# Patient Record
Sex: Female | Born: 1935 | Race: White | Hispanic: No | Smoking: Former smoker
Health system: Southern US, Community
[De-identification: ages and names within clinical notes are randomized; demographics above are authoritative.]

## PROBLEM LIST (undated history)

## (undated) DIAGNOSIS — F419 Anxiety disorder, unspecified: Secondary | ICD-10-CM

## (undated) DIAGNOSIS — E119 Type 2 diabetes mellitus without complications: Secondary | ICD-10-CM

## (undated) DIAGNOSIS — G459 Transient cerebral ischemic attack, unspecified: Secondary | ICD-10-CM

## (undated) DIAGNOSIS — M1990 Unspecified osteoarthritis, unspecified site: Secondary | ICD-10-CM

## (undated) DIAGNOSIS — I2510 Atherosclerotic heart disease of native coronary artery without angina pectoris: Secondary | ICD-10-CM

## (undated) DIAGNOSIS — I214 Non-ST elevation (NSTEMI) myocardial infarction: Secondary | ICD-10-CM

## (undated) DIAGNOSIS — F32A Depression, unspecified: Secondary | ICD-10-CM

## (undated) DIAGNOSIS — F329 Major depressive disorder, single episode, unspecified: Secondary | ICD-10-CM

## (undated) DIAGNOSIS — F0390 Unspecified dementia without behavioral disturbance: Secondary | ICD-10-CM

## (undated) DIAGNOSIS — I10 Essential (primary) hypertension: Secondary | ICD-10-CM

## (undated) DIAGNOSIS — H9319 Tinnitus, unspecified ear: Secondary | ICD-10-CM

## (undated) HISTORY — PX: ABDOMINAL HYSTERECTOMY: SHX81

## (undated) HISTORY — DX: Tinnitus, unspecified ear: H93.19

## (undated) NOTE — *Deleted (*Deleted)
Family Medicine Teaching Service Daily Progress Note Intern Pager: [phone]  Patient name: Shawna Lynch Medical record number: [id number] Date of birth: 1935-04-24 Age: 24 y.o. Gender: female  Primary Care Provider: Michaele Offer, DO Consultants: Neuro Code Status: Full  Pt Overview and Major Events to Date:  ***  Assessment and Plan:  ***  FEN/GI: *** PPx: ***   Status is: Observation  {Observation:23811}  Dispo: The patient is from: {From:23814}              Anticipated d/c is to: {To:23815}              Anticipated d/c date is: {Days:23816}              Patient currently {Medically stable:23817}        Subjective:  ***  Objective: Temp:  [97.5 F (36.4 C)-98.6 F (37 C)] 97.9 F (36.6 C) (11/16 0826) Pulse Rate:  [80-100] 92 (11/16 0826) Resp:  [12-29] 20 (11/16 0826) BP: (95-162)/(60-104) 154/82 (11/16 0826) SpO2:  [89 %-99 %] 97 % (11/16 0826) Weight:  [63.8 kg] 63.8 kg (11/15 1200) Physical Exam: General: *** Cardiovascular: *** Respiratory: *** Abdomen: *** Extremities: ***  Laboratory: Recent Labs  Lab 08/13/20 1233 08/13/20 1238 08/14/20 0454  WBC 8.4  --  6.8  HGB 12.9 12.6 11.8*  HCT 38.9 37.0 35.5*  PLT 205  --  202   Recent Labs  Lab 08/13/20 1233 08/13/20 1238 08/14/20 0454  NA 137 137 137  K 3.9 3.9 3.8  CL 99 98 102  CO2 26  --  26  BUN 12 14 11   CREATININE 1.24* 1.20* 1.13*  CALCIUM 9.3  --  9.4  PROT 6.5  --   --   BILITOT 0.4  --   --   ALKPHOS 49  --   --   ALT 29  --   --   AST 30  --   --   GLUCOSE 296* 300* 196*    ***  Imaging/Diagnostic Tests: Jovita Kussmaul, MD 08/14/2020, 9:10 AM PGY-***, [facility] FPTS Intern pager: [phone]*, text pages welcome

---

## 1998-07-11 ENCOUNTER — Other Ambulatory Visit: Admission: RE | Admit: 1998-07-11 | Discharge: 1998-07-11 | Payer: Self-pay | Admitting: Family Medicine

## 1999-10-14 ENCOUNTER — Emergency Department (HOSPITAL_COMMUNITY): Admission: EM | Admit: 1999-10-14 | Discharge: 1999-10-14 | Payer: Self-pay | Admitting: Emergency Medicine

## 1999-10-14 ENCOUNTER — Encounter: Payer: Self-pay | Admitting: Emergency Medicine

## 1999-12-03 ENCOUNTER — Other Ambulatory Visit: Admission: RE | Admit: 1999-12-03 | Discharge: 1999-12-03 | Payer: Self-pay | Admitting: Family Medicine

## 2000-11-11 ENCOUNTER — Encounter: Payer: Self-pay | Admitting: Emergency Medicine

## 2000-11-12 ENCOUNTER — Inpatient Hospital Stay (HOSPITAL_COMMUNITY): Admission: EM | Admit: 2000-11-12 | Discharge: 2000-11-13 | Payer: Self-pay | Admitting: Emergency Medicine

## 2000-11-13 ENCOUNTER — Encounter: Payer: Self-pay | Admitting: Cardiology

## 2001-02-20 ENCOUNTER — Emergency Department (HOSPITAL_COMMUNITY): Admission: EM | Admit: 2001-02-20 | Discharge: 2001-02-20 | Payer: Self-pay | Admitting: *Deleted

## 2001-04-05 ENCOUNTER — Other Ambulatory Visit: Admission: RE | Admit: 2001-04-05 | Discharge: 2001-04-05 | Payer: Self-pay | Admitting: Family Medicine

## 2002-07-25 ENCOUNTER — Other Ambulatory Visit: Admission: RE | Admit: 2002-07-25 | Discharge: 2002-07-25 | Payer: Self-pay | Admitting: Family Medicine

## 2005-03-24 ENCOUNTER — Other Ambulatory Visit: Admission: RE | Admit: 2005-03-24 | Discharge: 2005-03-24 | Payer: Self-pay | Admitting: Family Medicine

## 2005-10-17 ENCOUNTER — Encounter: Admission: RE | Admit: 2005-10-17 | Discharge: 2005-10-17 | Payer: Self-pay | Admitting: Gastroenterology

## 2009-01-02 ENCOUNTER — Ambulatory Visit: Payer: Self-pay | Admitting: Ophthalmology

## 2009-01-02 ENCOUNTER — Ambulatory Visit: Payer: Self-pay | Admitting: Cardiology

## 2009-01-08 ENCOUNTER — Ambulatory Visit: Payer: Self-pay | Admitting: Ophthalmology

## 2009-03-13 ENCOUNTER — Ambulatory Visit: Payer: Self-pay | Admitting: Ophthalmology

## 2012-12-17 ENCOUNTER — Emergency Department: Payer: Self-pay | Admitting: Emergency Medicine

## 2012-12-17 LAB — URINALYSIS, COMPLETE
Bacteria: NONE SEEN
Bilirubin,UR: NEGATIVE
Blood: NEGATIVE
Glucose,UR: NEGATIVE mg/dL (ref 0–75)
Nitrite: NEGATIVE
Ph: 7 (ref 4.5–8.0)
Protein: NEGATIVE
RBC,UR: 2 /HPF (ref 0–5)
Specific Gravity: 1.017 (ref 1.003–1.030)
Squamous Epithelial: 1
WBC UR: 1 /HPF (ref 0–5)

## 2012-12-17 LAB — COMPREHENSIVE METABOLIC PANEL
Albumin: 3.7 g/dL (ref 3.4–5.0)
Alkaline Phosphatase: 54 U/L (ref 50–136)
Anion Gap: 8 (ref 7–16)
BUN: 15 mg/dL (ref 7–18)
Bilirubin,Total: 0.4 mg/dL (ref 0.2–1.0)
Calcium, Total: 9.2 mg/dL (ref 8.5–10.1)
Chloride: 102 mmol/L (ref 98–107)
Co2: 29 mmol/L (ref 21–32)
Creatinine: 1 mg/dL (ref 0.60–1.30)
EGFR (African American): >60
EGFR (Non-African Amer.): 55 — ABNORMAL LOW
Glucose: 151 mg/dL — ABNORMAL HIGH (ref 65–99)
Osmolality: 281 (ref 275–301)
Potassium: 4 mmol/L (ref 3.5–5.1)
SGOT(AST): 23 U/L (ref 15–37)
SGPT (ALT): 17 U/L (ref 12–78)
Sodium: 139 mmol/L (ref 136–145)
Total Protein: 8.1 g/dL (ref 6.4–8.2)

## 2012-12-17 LAB — CBC
HCT: 34.5 % — ABNORMAL LOW (ref 35.0–47.0)
HGB: 11.4 g/dL — ABNORMAL LOW (ref 12.0–16.0)
MCH: 30 pg (ref 26.0–34.0)
MCHC: 33 g/dL (ref 32.0–36.0)
MCV: 91 fL (ref 80–100)
Platelet: 244 10*3/uL (ref 150–440)
RBC: 3.8 10*6/uL (ref 3.80–5.20)
RDW: 14.1 % (ref 11.5–14.5)
WBC: 8.5 10*3/uL (ref 3.6–11.0)

## 2012-12-17 LAB — TROPONIN I: Troponin-I: <0.02 ng/mL

## 2013-01-02 ENCOUNTER — Other Ambulatory Visit: Payer: Self-pay | Admitting: Oncology

## 2014-12-05 DIAGNOSIS — E780 Pure hypercholesterolemia: Secondary | ICD-10-CM | POA: Diagnosis not present

## 2014-12-05 DIAGNOSIS — D649 Anemia, unspecified: Secondary | ICD-10-CM | POA: Diagnosis not present

## 2014-12-05 DIAGNOSIS — E538 Deficiency of other specified B group vitamins: Secondary | ICD-10-CM | POA: Diagnosis not present

## 2014-12-05 DIAGNOSIS — E119 Type 2 diabetes mellitus without complications: Secondary | ICD-10-CM | POA: Diagnosis not present

## 2014-12-08 DIAGNOSIS — I10 Essential (primary) hypertension: Secondary | ICD-10-CM | POA: Diagnosis not present

## 2014-12-08 DIAGNOSIS — E119 Type 2 diabetes mellitus without complications: Secondary | ICD-10-CM | POA: Diagnosis not present

## 2014-12-08 DIAGNOSIS — Z9181 History of falling: Secondary | ICD-10-CM | POA: Diagnosis not present

## 2014-12-08 DIAGNOSIS — Z1389 Encounter for screening for other disorder: Secondary | ICD-10-CM | POA: Diagnosis not present

## 2014-12-08 DIAGNOSIS — E780 Pure hypercholesterolemia: Secondary | ICD-10-CM | POA: Diagnosis not present

## 2014-12-08 DIAGNOSIS — N183 Chronic kidney disease, stage 3 (moderate): Secondary | ICD-10-CM | POA: Diagnosis not present

## 2015-01-04 DIAGNOSIS — D485 Neoplasm of uncertain behavior of skin: Secondary | ICD-10-CM | POA: Diagnosis not present

## 2015-04-11 DIAGNOSIS — E780 Pure hypercholesterolemia: Secondary | ICD-10-CM | POA: Diagnosis not present

## 2015-04-11 DIAGNOSIS — E119 Type 2 diabetes mellitus without complications: Secondary | ICD-10-CM | POA: Diagnosis not present

## 2015-04-13 DIAGNOSIS — I10 Essential (primary) hypertension: Secondary | ICD-10-CM | POA: Diagnosis not present

## 2015-04-13 DIAGNOSIS — E780 Pure hypercholesterolemia: Secondary | ICD-10-CM | POA: Diagnosis not present

## 2015-04-13 DIAGNOSIS — N183 Chronic kidney disease, stage 3 (moderate): Secondary | ICD-10-CM | POA: Diagnosis not present

## 2015-04-13 DIAGNOSIS — Z1389 Encounter for screening for other disorder: Secondary | ICD-10-CM | POA: Diagnosis not present

## 2015-04-13 DIAGNOSIS — Z139 Encounter for screening, unspecified: Secondary | ICD-10-CM | POA: Diagnosis not present

## 2015-04-13 DIAGNOSIS — E119 Type 2 diabetes mellitus without complications: Secondary | ICD-10-CM | POA: Diagnosis not present

## 2015-04-16 ENCOUNTER — Other Ambulatory Visit: Payer: Self-pay | Admitting: Family Medicine

## 2015-04-16 DIAGNOSIS — Z1231 Encounter for screening mammogram for malignant neoplasm of breast: Secondary | ICD-10-CM

## 2015-04-20 DIAGNOSIS — Z1231 Encounter for screening mammogram for malignant neoplasm of breast: Secondary | ICD-10-CM | POA: Diagnosis not present

## 2015-04-20 DIAGNOSIS — Z803 Family history of malignant neoplasm of breast: Secondary | ICD-10-CM | POA: Diagnosis not present

## 2015-04-24 DIAGNOSIS — Z6827 Body mass index (BMI) 27.0-27.9, adult: Secondary | ICD-10-CM | POA: Diagnosis not present

## 2015-04-24 DIAGNOSIS — D485 Neoplasm of uncertain behavior of skin: Secondary | ICD-10-CM | POA: Diagnosis not present

## 2015-04-25 ENCOUNTER — Ambulatory Visit: Payer: Self-pay

## 2015-08-09 DIAGNOSIS — E7800 Pure hypercholesterolemia, unspecified: Secondary | ICD-10-CM | POA: Diagnosis not present

## 2015-08-09 DIAGNOSIS — E119 Type 2 diabetes mellitus without complications: Secondary | ICD-10-CM | POA: Diagnosis not present

## 2015-08-13 DIAGNOSIS — I10 Essential (primary) hypertension: Secondary | ICD-10-CM | POA: Diagnosis not present

## 2015-08-13 DIAGNOSIS — Z1389 Encounter for screening for other disorder: Secondary | ICD-10-CM | POA: Diagnosis not present

## 2015-08-13 DIAGNOSIS — E1165 Type 2 diabetes mellitus with hyperglycemia: Secondary | ICD-10-CM | POA: Diagnosis not present

## 2015-08-13 DIAGNOSIS — E7800 Pure hypercholesterolemia, unspecified: Secondary | ICD-10-CM | POA: Diagnosis not present

## 2015-08-13 DIAGNOSIS — Z9181 History of falling: Secondary | ICD-10-CM | POA: Diagnosis not present

## 2015-08-13 DIAGNOSIS — N183 Chronic kidney disease, stage 3 (moderate): Secondary | ICD-10-CM | POA: Diagnosis not present

## 2015-08-13 DIAGNOSIS — Z23 Encounter for immunization: Secondary | ICD-10-CM | POA: Diagnosis not present

## 2015-09-17 DIAGNOSIS — M545 Low back pain: Secondary | ICD-10-CM | POA: Diagnosis not present

## 2015-09-17 DIAGNOSIS — M47816 Spondylosis without myelopathy or radiculopathy, lumbar region: Secondary | ICD-10-CM | POA: Diagnosis not present

## 2015-11-15 DIAGNOSIS — I999 Unspecified disorder of circulatory system: Secondary | ICD-10-CM | POA: Diagnosis not present

## 2015-11-15 DIAGNOSIS — R413 Other amnesia: Secondary | ICD-10-CM | POA: Diagnosis not present

## 2015-11-20 ENCOUNTER — Other Ambulatory Visit: Payer: Self-pay | Admitting: Family Medicine

## 2015-11-20 DIAGNOSIS — Z8679 Personal history of other diseases of the circulatory system: Secondary | ICD-10-CM

## 2015-11-20 DIAGNOSIS — R413 Other amnesia: Secondary | ICD-10-CM

## 2015-11-28 ENCOUNTER — Ambulatory Visit
Admission: RE | Admit: 2015-11-28 | Discharge: 2015-11-28 | Disposition: A | Discharge status: Home / Self Care | Payer: Medicare Other | Source: Ambulatory Visit | Attending: Family Medicine | Admitting: Family Medicine

## 2015-11-28 DIAGNOSIS — R413 Other amnesia: Secondary | ICD-10-CM

## 2015-11-28 DIAGNOSIS — Z8679 Personal history of other diseases of the circulatory system: Secondary | ICD-10-CM

## 2015-12-12 DIAGNOSIS — E7800 Pure hypercholesterolemia, unspecified: Secondary | ICD-10-CM | POA: Diagnosis not present

## 2015-12-12 DIAGNOSIS — E1165 Type 2 diabetes mellitus with hyperglycemia: Secondary | ICD-10-CM | POA: Diagnosis not present

## 2015-12-12 DIAGNOSIS — R413 Other amnesia: Secondary | ICD-10-CM | POA: Diagnosis not present

## 2015-12-18 DIAGNOSIS — E7800 Pure hypercholesterolemia, unspecified: Secondary | ICD-10-CM | POA: Diagnosis not present

## 2015-12-18 DIAGNOSIS — I10 Essential (primary) hypertension: Secondary | ICD-10-CM | POA: Diagnosis not present

## 2015-12-18 DIAGNOSIS — N183 Chronic kidney disease, stage 3 (moderate): Secondary | ICD-10-CM | POA: Diagnosis not present

## 2015-12-18 DIAGNOSIS — E1165 Type 2 diabetes mellitus with hyperglycemia: Secondary | ICD-10-CM | POA: Diagnosis not present

## 2015-12-18 DIAGNOSIS — E663 Overweight: Secondary | ICD-10-CM | POA: Diagnosis not present

## 2015-12-18 DIAGNOSIS — R350 Frequency of micturition: Secondary | ICD-10-CM | POA: Diagnosis not present

## 2016-01-06 DIAGNOSIS — S63602A Unspecified sprain of left thumb, initial encounter: Secondary | ICD-10-CM | POA: Diagnosis not present

## 2016-01-16 DIAGNOSIS — M1812 Unilateral primary osteoarthritis of first carpometacarpal joint, left hand: Secondary | ICD-10-CM | POA: Diagnosis not present

## 2016-04-18 DIAGNOSIS — E1165 Type 2 diabetes mellitus with hyperglycemia: Secondary | ICD-10-CM | POA: Diagnosis not present

## 2016-04-18 DIAGNOSIS — E7800 Pure hypercholesterolemia, unspecified: Secondary | ICD-10-CM | POA: Diagnosis not present

## 2016-04-18 DIAGNOSIS — I10 Essential (primary) hypertension: Secondary | ICD-10-CM | POA: Diagnosis not present

## 2016-04-18 DIAGNOSIS — N183 Chronic kidney disease, stage 3 (moderate): Secondary | ICD-10-CM | POA: Diagnosis not present

## 2016-06-17 DIAGNOSIS — L821 Other seborrheic keratosis: Secondary | ICD-10-CM | POA: Diagnosis not present

## 2016-06-17 DIAGNOSIS — L409 Psoriasis, unspecified: Secondary | ICD-10-CM | POA: Diagnosis not present

## 2016-12-18 DIAGNOSIS — Z6826 Body mass index (BMI) 26.0-26.9, adult: Secondary | ICD-10-CM | POA: Diagnosis not present

## 2016-12-18 DIAGNOSIS — E7800 Pure hypercholesterolemia, unspecified: Secondary | ICD-10-CM | POA: Diagnosis not present

## 2016-12-18 DIAGNOSIS — I10 Essential (primary) hypertension: Secondary | ICD-10-CM | POA: Diagnosis not present

## 2016-12-18 DIAGNOSIS — Z139 Encounter for screening, unspecified: Secondary | ICD-10-CM | POA: Diagnosis not present

## 2016-12-18 DIAGNOSIS — F419 Anxiety disorder, unspecified: Secondary | ICD-10-CM | POA: Diagnosis not present

## 2016-12-18 DIAGNOSIS — E1165 Type 2 diabetes mellitus with hyperglycemia: Secondary | ICD-10-CM | POA: Diagnosis not present

## 2016-12-18 DIAGNOSIS — R413 Other amnesia: Secondary | ICD-10-CM | POA: Diagnosis not present

## 2017-02-10 DIAGNOSIS — Z6825 Body mass index (BMI) 25.0-25.9, adult: Secondary | ICD-10-CM | POA: Diagnosis not present

## 2017-02-10 DIAGNOSIS — J189 Pneumonia, unspecified organism: Secondary | ICD-10-CM | POA: Diagnosis not present

## 2017-02-16 DIAGNOSIS — Z6825 Body mass index (BMI) 25.0-25.9, adult: Secondary | ICD-10-CM | POA: Diagnosis not present

## 2017-02-16 DIAGNOSIS — R05 Cough: Secondary | ICD-10-CM | POA: Diagnosis not present

## 2017-04-03 ENCOUNTER — Encounter (HOSPITAL_COMMUNITY): Payer: Self-pay | Admitting: Emergency Medicine

## 2017-04-03 ENCOUNTER — Emergency Department (HOSPITAL_COMMUNITY): Payer: Medicare HMO

## 2017-04-03 ENCOUNTER — Inpatient Hospital Stay (HOSPITAL_COMMUNITY)
Admission: EM | Admit: 2017-04-03 | Discharge: 2017-04-07 | DRG: 247 | Disposition: A | Discharge status: Home / Self Care | Payer: Medicare HMO | Attending: Cardiovascular Disease | Admitting: Cardiovascular Disease

## 2017-04-03 DIAGNOSIS — N182 Chronic kidney disease, stage 2 (mild): Secondary | ICD-10-CM | POA: Diagnosis not present

## 2017-04-03 DIAGNOSIS — E118 Type 2 diabetes mellitus with unspecified complications: Secondary | ICD-10-CM | POA: Diagnosis not present

## 2017-04-03 DIAGNOSIS — F0390 Unspecified dementia without behavioral disturbance: Secondary | ICD-10-CM | POA: Diagnosis not present

## 2017-04-03 DIAGNOSIS — E1122 Type 2 diabetes mellitus with diabetic chronic kidney disease: Secondary | ICD-10-CM | POA: Diagnosis present

## 2017-04-03 DIAGNOSIS — R0789 Other chest pain: Secondary | ICD-10-CM | POA: Diagnosis not present

## 2017-04-03 DIAGNOSIS — Z79899 Other long term (current) drug therapy: Secondary | ICD-10-CM

## 2017-04-03 DIAGNOSIS — E785 Hyperlipidemia, unspecified: Secondary | ICD-10-CM | POA: Diagnosis not present

## 2017-04-03 DIAGNOSIS — I129 Hypertensive chronic kidney disease with stage 1 through stage 4 chronic kidney disease, or unspecified chronic kidney disease: Secondary | ICD-10-CM | POA: Diagnosis present

## 2017-04-03 DIAGNOSIS — Z955 Presence of coronary angioplasty implant and graft: Secondary | ICD-10-CM

## 2017-04-03 DIAGNOSIS — Z87891 Personal history of nicotine dependence: Secondary | ICD-10-CM | POA: Diagnosis not present

## 2017-04-03 DIAGNOSIS — Z885 Allergy status to narcotic agent status: Secondary | ICD-10-CM

## 2017-04-03 DIAGNOSIS — I214 Non-ST elevation (NSTEMI) myocardial infarction: Principal | ICD-10-CM | POA: Diagnosis present

## 2017-04-03 DIAGNOSIS — D631 Anemia in chronic kidney disease: Secondary | ICD-10-CM | POA: Diagnosis present

## 2017-04-03 DIAGNOSIS — Z7984 Long term (current) use of oral hypoglycemic drugs: Secondary | ICD-10-CM

## 2017-04-03 DIAGNOSIS — I10 Essential (primary) hypertension: Secondary | ICD-10-CM | POA: Diagnosis present

## 2017-04-03 DIAGNOSIS — I252 Old myocardial infarction: Secondary | ICD-10-CM | POA: Diagnosis present

## 2017-04-03 DIAGNOSIS — R079 Chest pain, unspecified: Secondary | ICD-10-CM | POA: Diagnosis not present

## 2017-04-03 DIAGNOSIS — I2510 Atherosclerotic heart disease of native coronary artery without angina pectoris: Secondary | ICD-10-CM | POA: Diagnosis not present

## 2017-04-03 DIAGNOSIS — E784 Other hyperlipidemia: Secondary | ICD-10-CM | POA: Diagnosis not present

## 2017-04-03 DIAGNOSIS — E119 Type 2 diabetes mellitus without complications: Secondary | ICD-10-CM | POA: Diagnosis present

## 2017-04-03 HISTORY — DX: Non-ST elevation (NSTEMI) myocardial infarction: I21.4

## 2017-04-03 HISTORY — DX: Type 2 diabetes mellitus without complications: E11.9

## 2017-04-03 HISTORY — DX: Anxiety disorder, unspecified: F41.9

## 2017-04-03 HISTORY — DX: Unspecified dementia, unspecified severity, without behavioral disturbance, psychotic disturbance, mood disturbance, and anxiety: F03.90

## 2017-04-03 HISTORY — DX: Unspecified osteoarthritis, unspecified site: M19.90

## 2017-04-03 HISTORY — DX: Transient cerebral ischemic attack, unspecified: G45.9

## 2017-04-03 HISTORY — DX: Atherosclerotic heart disease of native coronary artery without angina pectoris: I25.10

## 2017-04-03 HISTORY — DX: Essential (primary) hypertension: I10

## 2017-04-03 HISTORY — DX: Major depressive disorder, single episode, unspecified: F32.9

## 2017-04-03 HISTORY — DX: Depression, unspecified: F32.A

## 2017-04-03 LAB — CBC
HEMATOCRIT: 33.6 % — AB (ref 36.0–46.0)
Hemoglobin: 11.3 g/dL — ABNORMAL LOW (ref 12.0–15.0)
MCH: 29.9 pg (ref 26.0–34.0)
MCHC: 33.6 g/dL (ref 30.0–36.0)
MCV: 88.9 fL (ref 78.0–100.0)
PLATELETS: 175 10*3/uL (ref 150–400)
RBC: 3.78 MIL/uL — ABNORMAL LOW (ref 3.87–5.11)
RDW: 13.6 % (ref 11.5–15.5)
WBC: 9.6 10*3/uL (ref 4.0–10.5)

## 2017-04-03 LAB — BASIC METABOLIC PANEL
Anion gap: 11 (ref 5–15)
BUN: 18 mg/dL (ref 6–20)
CHLORIDE: 106 mmol/L (ref 101–111)
CO2: 18 mmol/L — ABNORMAL LOW (ref 22–32)
CREATININE: 1.29 mg/dL — AB (ref 0.44–1.00)
Calcium: 9 mg/dL (ref 8.9–10.3)
GFR calc Af Amer: 44 mL/min — ABNORMAL LOW (ref >60)
GFR calc non Af Amer: 38 mL/min — ABNORMAL LOW (ref >60)
GLUCOSE: 132 mg/dL — AB (ref 65–99)
POTASSIUM: 4.4 mmol/L (ref 3.5–5.1)
SODIUM: 135 mmol/L (ref 135–145)

## 2017-04-03 LAB — PROTIME-INR
INR: 0.93
Prothrombin Time: 12.5 seconds (ref 11.4–15.2)

## 2017-04-03 LAB — I-STAT TROPONIN, ED: Troponin i, poc: 3.87 ng/mL (ref 0.00–0.08)

## 2017-04-03 MED ORDER — ACETAMINOPHEN 325 MG PO TABS
650.0000 mg | ORAL_TABLET | ORAL | Status: DC | PRN
  Administered 2017-04-05: 650 mg via ORAL
  Filled 2017-04-03: qty 2

## 2017-04-03 MED ORDER — HEPARIN (PORCINE) IN NACL 100-0.45 UNIT/ML-% IJ SOLN
800.0000 [IU]/h | INTRAMUSCULAR | Status: DC
  Administered 2017-04-03: 800 [IU]/h via INTRAVENOUS
  Filled 2017-04-03: qty 250

## 2017-04-03 MED ORDER — PANTOPRAZOLE SODIUM 40 MG PO TBEC
40.0000 mg | DELAYED_RELEASE_TABLET | Freq: Every day | ORAL | Status: DC
  Administered 2017-04-04 – 2017-04-07 (×4): 40 mg via ORAL
  Filled 2017-04-03 (×4): qty 1

## 2017-04-03 MED ORDER — HEPARIN SODIUM (PORCINE) 5000 UNIT/ML IJ SOLN: 4000.0000 [IU] | Freq: Once | INTRAMUSCULAR | Status: DC

## 2017-04-03 MED ORDER — HEPARIN SODIUM (PORCINE) 5000 UNIT/ML IJ SOLN: 60.0000 [IU]/kg | Freq: Once | INTRAMUSCULAR | Status: DC

## 2017-04-03 MED ORDER — NITROGLYCERIN 0.4 MG SL SUBL: 0.4000 mg | SUBLINGUAL_TABLET | SUBLINGUAL | Status: DC | PRN

## 2017-04-03 MED ORDER — DONEPEZIL HCL 10 MG PO TABS
10.0000 mg | ORAL_TABLET | Freq: Every day | ORAL | Status: DC
  Administered 2017-04-03 – 2017-04-06 (×4): 10 mg via ORAL
  Filled 2017-04-03 (×4): qty 1

## 2017-04-03 MED ORDER — ASPIRIN 81 MG PO CHEW
324.0000 mg | CHEWABLE_TABLET | Freq: Once | ORAL | Status: AC
  Administered 2017-04-04: 324 mg via ORAL
  Filled 2017-04-03: qty 4

## 2017-04-03 MED ORDER — ATORVASTATIN CALCIUM 80 MG PO TABS
80.0000 mg | ORAL_TABLET | Freq: Every day | ORAL | Status: DC
  Administered 2017-04-04 – 2017-04-06 (×3): 80 mg via ORAL
  Filled 2017-04-03 (×3): qty 1

## 2017-04-03 MED ORDER — HEPARIN BOLUS VIA INFUSION
4000.0000 [IU] | Freq: Once | INTRAVENOUS | Status: AC
  Administered 2017-04-03: 4000 [IU] via INTRAVENOUS
  Filled 2017-04-03: qty 4000

## 2017-04-03 MED ORDER — SERTRALINE HCL 50 MG PO TABS
100.0000 mg | ORAL_TABLET | Freq: Every day | ORAL | Status: DC
Start: 2017-04-04 — End: 2017-04-07
  Administered 2017-04-04 – 2017-04-07 (×4): 100 mg via ORAL
  Filled 2017-04-03: qty 2
  Filled 2017-04-03: qty 1
  Filled 2017-04-03: qty 2
  Filled 2017-04-03: qty 1

## 2017-04-03 MED ORDER — ONDANSETRON HCL 4 MG/2ML IJ SOLN
4.0000 mg | Freq: Four times a day (QID) | INTRAMUSCULAR | Status: DC | PRN
  Administered 2017-04-07: 4 mg via INTRAVENOUS
  Filled 2017-04-03: qty 2

## 2017-04-03 MED ORDER — MEMANTINE HCL ER 28 MG PO CP24
28.0000 mg | ORAL_CAPSULE | Freq: Every day | ORAL | Status: DC
  Administered 2017-04-03 – 2017-04-06 (×4): 28 mg via ORAL
  Filled 2017-04-03 (×4): qty 1

## 2017-04-03 MED ORDER — METOPROLOL TARTRATE 25 MG PO TABS
25.0000 mg | ORAL_TABLET | Freq: Two times a day (BID) | ORAL | Status: DC
  Administered 2017-04-03 – 2017-04-07 (×8): 25 mg via ORAL
  Filled 2017-04-03 (×8): qty 1

## 2017-04-03 MED ORDER — LISINOPRIL 10 MG PO TABS
20.0000 mg | ORAL_TABLET | Freq: Every day | ORAL | Status: DC
  Administered 2017-04-04 – 2017-04-05 (×2): 20 mg via ORAL
  Filled 2017-04-03 (×2): qty 1

## 2017-04-03 MED ORDER — INSULIN ASPART 100 UNIT/ML ~~LOC~~ SOLN
0.0000 [IU] | Freq: Three times a day (TID) | SUBCUTANEOUS | Status: DC
  Administered 2017-04-04: 1 [IU] via SUBCUTANEOUS
  Administered 2017-04-04 – 2017-04-05 (×3): 2 [IU] via SUBCUTANEOUS
  Administered 2017-04-05: 1 [IU] via SUBCUTANEOUS
  Administered 2017-04-06: 2 [IU] via SUBCUTANEOUS
  Administered 2017-04-06: 11:00:00 1 [IU] via SUBCUTANEOUS
  Administered 2017-04-07: 07:00:00 2 [IU] via SUBCUTANEOUS

## 2017-04-03 MED ORDER — ASPIRIN EC 81 MG PO TBEC
81.0000 mg | DELAYED_RELEASE_TABLET | Freq: Every day | ORAL | Status: DC
  Administered 2017-04-04 – 2017-04-07 (×3): 81 mg via ORAL
  Filled 2017-04-03 (×3): qty 1

## 2017-04-03 NOTE — H&P (Signed)
CARDIOLOGY HISTORY AND PHYSICAL     Date: 04/03/2017 Admitting Physician: Baruch Merl, MD, PhD  Chief Complaint:  Chest Pain ____________________________________________________________________ History of Present Illness:  Shawna Lynch is a 82 y.o. old female with medical history noted below who presents with acute complaints of chest pain.  The patient has some baseline dementia by her own admission and has difficulty describing her presenting complaint.  The history is partially provided by the patient's son.  She was at a friends house earlier in the evening when she developed onset of chest pain.  Per the ED notes, this pain started yesterday, however, she cannot recall when it started.  The pain is localized to the left breast and felt as though someone was squeezing her chest.  She doesn't remember if she had dyspnea, but denied lightheadedness, nausea, or diaphoresis.  She has never had chest discomfort like this before.  She was given ASA on the ambulance ride and on my exam she is currently chest pain free.  She denies history of CAD, MI, or CHF.  No acute CHF symptoms.  Not currently short of breath.  No recent rapid weight gain.  She states she has occasional "fluttering" in her chest.  None associated with this episode of chest pain.    Review of Systems:   Review of Systems:  GEN: no fever, chills, nausea, vomiting, weight change  HEENT: no vision or hearing changes  PULM: no coughing, SOB  CV: +chest pain, palpitations, PND, orthopnea  GI: no abdominal pain  GU: no dysuria  EXT: no swelling  SKIN: no rashes  NEURO: no numbness or tingling  HEME: no bleeding or bruising  GYN: none  --12 point review systems- otherwise negative.  All other systems reviewed and are negative  Past Medical History:  Diagnosis Date  . Dementia   . Diabetes mellitus without complication ([place])   . Hypertension     Past Surgical History:  Procedure Laterality Date  . ABDOMINAL  HYSTERECTOMY      Social History   Social History  . Marital status: Married    Spouse name: N/A  . Number of children: N/A  . Years of education: N/A   Occupational History  . Not on file.   Social History Main Topics  . Smoking status: Former Research scientist (life sciences)  . Smokeless tobacco: Not on file     Comment: quit "20 years ago"  . Alcohol use No  . Drug use: No  . Sexual activity: Not on file   Other Topics Concern  . Not on file   Social History Narrative  . No narrative on file    Family History  Problem Relation Age of Onset  . CAD Father   . Cancer Sister     Past Cardiovascular History:  - No documented h/o CAD - No documented h/o MI - No documented h/o CHF - No documented h/o PVD - No documented h/o AAA - No documented h/o valvular heart disease - No documented h/o CVA - No documented h/o Arrhythmias - No documented h/o A-fib  - No documented h/o congenital heart disease - No documented h/o CABG - No documented h/o PCI - No documented h/o cardiac devices (Pacer/ICD/CRT) - No documented h/o cardiac surgery       Most recent stress test:  None  Most recent echocardiography:  None  Most recent left heart catheterization:  None  CABG:  Date/ Physician: None  Device history:  None  Prior to Admission medications  Medication Sig Start Date End Date Taking? Authorizing Provider  carvedilol (COREG) 25 MG tablet Take 12.5 mg by mouth 2 (two) times daily with a meal.   Yes [provider]  donepezil (ARICEPT) 10 MG tablet Take 10 mg by mouth at bedtime.   Yes [provider]  lisinopril (PRINIVIL,ZESTRIL) 40 MG tablet Take 40 mg by mouth daily.   Yes [provider]  memantine (NAMENDA XR) 28 MG CP24 24 hr capsule Take 28 mg by mouth at bedtime.   Yes [provider]  metFORMIN (GLUCOPHAGE) 500 MG tablet Take 500 mg by mouth 2 (two) times daily with a meal.   Yes [provider]  omega-3 acid ethyl esters (LOVAZA) 1 g  capsule Take 1 g by mouth 2 (two) times daily.   Yes [provider]  omeprazole (PRILOSEC) 20 MG capsule Take 20 mg by mouth daily.   Yes [provider]  pioglitazone (ACTOS) 45 MG tablet Take 45 mg by mouth daily.   Yes [provider]  sertraline (ZOLOFT) 100 MG tablet Take 100 mg by mouth daily.   Yes [provider]  simvastatin (ZOCOR) 40 MG tablet Take 40 mg by mouth daily.   Yes [provider]    Allergies  Allergen Reactions  . Codeine Rash    Social History:   Social History  Substance Use Topics  . Smoking status: Former Research scientist (life sciences)  . Smokeless tobacco: Not on file     Comment: quit "20 years ago"  . Alcohol use No    Family History  Problem Relation Age of Onset  . CAD Father   . Cancer Sister     Physical Examination: Blood pressure (!) 113/57, pulse 81, temperature 98.2 F (36.8 C), resp. rate 20, height 5\' 6"  (1.676 m), weight 65.8 kg (145 lb), SpO2 96 %. General:  Alert to person and place.  NAD.  NRD.  HENT: Normocephalic. Atraumatic.  No acute abnom. EYES: PERRL EOMI  Neck: Supple.  No JVD.  No bruits. Cardiovascular:  Nl S1. Nl S2. No S3. No S4. Nl PMI. 2/6 systolic murmur, No r/c. RRR  Pulmonary/Chest: CTA B. No rales. No wheezing.  Abdomen: Soft, NT, no masses, no organomegaly. Neuro: CN intact, no motor/sensory deficit.  Ext: Warm. No edema.  SKIN- intact  No intake or output data in the 24 hours ending 04/03/17 2211  Troponin (Point of Care Test)  Recent Labs  04/03/17 1837  TROPIPOC 3.87*   ____________________________________________________________________ Assessment/Plan  NSTEMI   Assessment:  Patient presents with complaints of chest pain and found to have elevated cardiac enzymes.  She is currently chest pain free.  Will continue heparin, ASA, beta blockade and make NPO after midnight for possible heart cath.  I think it's certainly with reason to treat her medically given her advanced age  and the fact she is currently chest pain free.  Having said that, will defer to the day team regarding going to the cath lab.   Plan  -  Heparin  -  Trend enzymes  -  ECHO  -  Metoprolol  -  Statin  -  Home lisinopril at 20 mg  Diabetes   Assessment:  On metformin at home.  Will hold that here and cover her with supplemental insulin.     Plan  -  Hold metformin  -  Supplemental insulin for glucose coverage  Dementia -  Continue home medical management  Thank you for consulting cardiology.  Electronically signed by Lowella Dandy 04/03/2017 Baruch Merl, MD, PhD Cardiology

## 2017-04-03 NOTE — ED Notes (Signed)
Assisted to bedside commode, stumbled on transfer

## 2017-04-03 NOTE — Progress Notes (Signed)
ANTICOAGULATION CONSULT NOTE - Initial Consult  Pharmacy Consult for heparin Indication: chest pain/ACS  Not on File  Patient Measurements: Height: 5\' 6"  (167.6 cm) Weight: 145 lb (65.8 kg) IBW/kg (Calculated) : 59.3 Heparin Dosing Weight: 65.8kg  Vital Signs: Temp: 98.2 F (36.8 C) (07/06 1755) BP: 142/70 (07/06 1845) Pulse Rate: 76 (07/06 1845)  Labs:  Recent Labs  04/03/17 1830  HGB 11.3*  HCT 33.6*  PLT 175  LABPROT 12.5  INR 0.93  CREATININE 1.29*    Estimated Creatinine Clearance: 32.6 mL/min (A) (by C-G formula based on SCr of 1.29 mg/dL (H)).   Medical History: Past Medical History:  Diagnosis Date  . Dementia   . Diabetes mellitus without complication ([place])   . Hypertension     Medications:  Infusions:  . heparin      Assessment: 65 yof presented to the ED with CP and SOB. Troponin found to be significantly elevated. To start IV heparin. Baseline H/H is slightly low and platelets are WNL. She is not on anticoagulation PTA.   Goal of Therapy:  Heparin level 0.3-0.7 units/ml Monitor platelets by anticoagulation protocol: Yes   Plan:  Heparin bolus 4000 units IV x 1 Heparin gtt 800 units/hr Check an 8 hr heparin level Daily heparin level and CBC  Aleya Durnell, Rande Lawman 04/03/2017,8:41 PM

## 2017-04-03 NOTE — ED Provider Notes (Signed)
[place] DEPT Provider Note   CSN: [id number] Arrival date & time: 04/03/17  1730     History   Chief Complaint Chief Complaint  Patient presents with  . Chest Pain    HPI Shawna Lynch is a 82 y.o. female.  The history is provided by the patient.  Illness  This is a new problem. The current episode started 12 to 24 hours ago. The problem occurs rarely. The problem has been gradually improving. Associated symptoms include chest pain. Pertinent negatives include no abdominal pain, no headaches and no shortness of breath. Nothing aggravates the symptoms. Nothing relieves the symptoms. She has tried nothing for the symptoms.    Past Medical History:  Diagnosis Date  . Dementia   . Diabetes mellitus without complication ([place])   . Hypertension     Patient Active Problem List   Diagnosis Date Noted  . NSTEMI (non-ST elevated myocardial infarction) ([place]) 04/03/2017    Past Surgical History:  Procedure Laterality Date  . ABDOMINAL HYSTERECTOMY      OB History    No data available       Home Medications    Prior to Admission medications   Not on File    Family History Family History  Problem Relation Age of Onset  . CAD Father   . Cancer Sister     Social History Social History  Substance Use Topics  . Smoking status: Former Research scientist (life sciences)  . Smokeless tobacco: Not on file     Comment: quit "20 years ago"  . Alcohol use No     Allergies   Codeine   Review of Systems Review of Systems  Constitutional: Negative for chills and fever.  HENT: Negative for ear pain and sore throat.   Eyes: Negative for pain and visual disturbance.  Respiratory: Negative for cough and shortness of breath.   Cardiovascular: Positive for chest pain. Negative for palpitations.  Gastrointestinal: Negative for abdominal pain and vomiting.  Genitourinary: Negative for dysuria and hematuria.  Musculoskeletal: Negative for arthralgias and back pain.  Skin: Negative for color  change and rash.  Allergic/Immunologic: Negative for immunocompromised state.  Neurological: Negative for seizures, syncope and headaches.  Psychiatric/Behavioral: Negative for agitation and behavioral problems.  All other systems reviewed and are negative.    Physical Exam Updated Vital Signs BP (!) 157/78   Pulse 80   Temp 98.2 F (36.8 C)   Resp 16   Ht 5\' 6"  (1.676 m)   Wt 65.8 kg (145 lb)   SpO2 99%   BMI 23.40 kg/m   Physical Exam  Constitutional: She is oriented to person, place, and time. She appears well-developed and well-nourished. No distress.  HENT:  Head: Normocephalic and atraumatic.  Eyes: Conjunctivae and EOM are normal. Pupils are equal, round, and reactive to light.  Neck: Normal range of motion. Neck supple. No tracheal deviation present.  Cardiovascular: Normal rate and regular rhythm.   No murmur heard. Pulmonary/Chest: Effort normal and breath sounds normal. No respiratory distress.  Abdominal: Soft. There is no tenderness.  Musculoskeletal: She exhibits no edema or deformity.  Neurological: She is alert and oriented to person, place, and time.  Skin: Skin is warm and dry. She is not diaphoretic.  Psychiatric: She has a normal mood and affect.  Nursing note and vitals reviewed.    ED Treatments / Results  Labs (all labs ordered are listed, but only abnormal results are displayed) Labs Reviewed  BASIC METABOLIC PANEL - Abnormal; Notable for the  following:       Result Value   CO2 18 (*)    Glucose, Bld 132 (*)    Creatinine, Ser 1.29 (*)    GFR calc non Af Amer 38 (*)    GFR calc Af Amer 44 (*)    All other components within normal limits  CBC - Abnormal; Notable for the following:    RBC 3.78 (*)    Hemoglobin 11.3 (*)    HCT 33.6 (*)    All other components within normal limits  I-STAT TROPOININ, ED - Abnormal; Notable for the following:    Troponin i, poc 3.87 (*)    All other components within normal limits  PROTIME-INR  HEPARIN  LEVEL (UNFRACTIONATED)  CBC    EKG  EKG Interpretation  Date/Time:  Friday April 03 2017 17:54:03 EDT Ventricular Rate:  81 PR Interval:    QRS Duration: 85 QT Interval:  365 QTC Calculation: 424 R Axis:   17 Text Interpretation:  Sinus rhythm No significant change since last tracing Confirmed by Fredia Sorrow [phone]) on 04/03/2017 7:07:09 PM       Radiology Dg Chest 2 View  Result Date: 04/03/2017 CLINICAL DATA:  Acute left chest pain today. EXAM: CHEST  2 VIEW COMPARISON:  None. FINDINGS: The cardiomediastinal silhouette is unremarkable. There is no evidence of focal airspace disease, pulmonary edema, suspicious pulmonary nodule/mass, pleural effusion, or pneumothorax. No acute bony abnormalities are identified. IMPRESSION: No active cardiopulmonary disease. Electronically Signed   By: Margarette Canada M.D.   On: 04/03/2017 18:25    Procedures Procedures (including critical care time)  Medications Ordered in ED Medications  aspirin chewable tablet 324 mg (324 mg Oral Not Given 04/03/17 2003)  heparin ADULT infusion 100 units/mL (25000 units/228mL sodium chloride 0.45%) (800 Units/hr Intravenous New Bag/Given 04/03/17 2044)  heparin bolus via infusion 4,000 Units (4,000 Units Intravenous Bolus from Bag 04/03/17 2049)     Initial Impression / Assessment and Plan / ED Course  I have reviewed the triage vital signs and the nursing notes.  Pertinent labs & imaging results that were available during my care of the patient were reviewed by me and considered in my medical decision making (see chart for details).     Shawna Lynch is a 82 year old female with hx of mild dementia, htn, DM who is coming in [place] with chest pain. Patient states she woke up this morning and began having left-sided chest pain she described as sharp as well as heavy. No nausea, vomiting, diaphoresis at that time. Patient was watching TV when it initially started and denies any inciting event. States it resolved on  its own but she has since maintained that she feels sore on the left side of her chest.  On exam patient sitting up in bed in no apparent distress. Mild hypertension but vital signs otherwise stable and within normal limits. Cardiac exam unremarkable as well as respiratory exam. Given 324 aspirin. EKG shows sinus rhythm with a heart rate of 81 beats minute, PR interval 170 ms, QRS duration 85 ms, QTC 424 ms. No signs of ST segment elevation or other signs of acute ischemia. Troponin elevated at 3.87. Basic metabolic panel shows slightly elevated creatinine at 1.29. CBC shows no leukocytosis. Chest x-ray shows no cardiopulmonary abnormalities. With concern for N STEMI, cardiology was consulted and they evaluated the patient in the emergency department. She will be admitted to their service for further evaluation and management. Heparin started here in the emergency department.  Patient was seen with my attending, Dr. Rogene Houston, who voiced agreement and oversaw the evaluation and treatment of this patient.   [employer] seismologist was used in the creation of this note. If there are any errors or inconsistencies needing clarification, please contact me directly.   Final Clinical Impressions(s) / ED Diagnoses   Final diagnoses:  NSTEMI (non-ST elevated myocardial infarction) [facility])    New Prescriptions New Prescriptions   No medications on file     Valda Lamb, MD 04/03/17 4742    Fredia Sorrow, MD 04/10/17 [phone]

## 2017-04-03 NOTE — Care Management (Signed)
ED CM  Met with patient at bedside patient confirmed information. Patient lives alone independently, daughter lives nearby and cheeks in on her. Ambulates without assistance. PCP Cyndi Bender PA-C in [place]. Patient utilizes [facility]) [phone] in [place]. Patient plans to return home after this hospitalization. Fenton Malling ([phone] is her contact person. CM explained unit CM will follow for transitional care planning needs.

## 2017-04-03 NOTE — ED Triage Notes (Signed)
Pt BIB [facility] from friends house for intermittent CP since 56 yesterday. Pt endorses SOB with exertion and describes CP as 1/10 central tightness with radiation to neck. Received 324 mg ASA PTA. NSR with EMS. Pt hx of dementia, but A&O x 4. Resp e/u; NAD noted at this time.

## 2017-04-04 ENCOUNTER — Inpatient Hospital Stay (HOSPITAL_COMMUNITY): Payer: Medicare HMO

## 2017-04-04 LAB — ECHOCARDIOGRAM COMPLETE
CHL CUP DOP CALC LVOT VTI: 20.5 cm
CHL CUP MV DEC (S): 197
E decel time: 197 msec
E/e' ratio: 11.28
FS: 31 % (ref 28–44)
HEIGHTINCHES: 66 in
IV/PV OW: 0.99
LA vol: 38.4 mL
LADIAMINDEX: 2.1 cm/m2
LASIZE: 37 mm
LAVOLA4C: 43.8 mL
LAVOLIN: 21.8 mL/m2
LEFT ATRIUM END SYS DIAM: 37 mm
LVEEAVG: 11.28
LVEEMED: 11.28
LVELAT: 5.77 cm/s
LVOT peak vel: 93.3 cm/s
MV pk E vel: 65.1 m/s
MVPKAVEL: 93 m/s
PW: 10 mm — AB (ref 0.6–1.1)
RV sys press: 34 mmHg
Reg peak vel: 278 cm/s
TAPSE: 16.7 mm
TDI e' lateral: 5.77
TDI e' medial: 5.84
TR max vel: 278 cm/s
Weight: 2366.4 oz

## 2017-04-04 LAB — BASIC METABOLIC PANEL
Anion gap: 8 (ref 5–15)
BUN: 15 mg/dL (ref 6–20)
CHLORIDE: 107 mmol/L (ref 101–111)
CO2: 23 mmol/L (ref 22–32)
CREATININE: 1.19 mg/dL — AB (ref 0.44–1.00)
Calcium: 9 mg/dL (ref 8.9–10.3)
GFR calc Af Amer: 49 mL/min — ABNORMAL LOW (ref >60)
GFR calc non Af Amer: 42 mL/min — ABNORMAL LOW (ref >60)
GLUCOSE: 155 mg/dL — AB (ref 65–99)
POTASSIUM: 4.4 mmol/L (ref 3.5–5.1)
Sodium: 138 mmol/L (ref 135–145)

## 2017-04-04 LAB — GLUCOSE, CAPILLARY
GLUCOSE-CAPILLARY: 153 mg/dL — AB (ref 65–99)
GLUCOSE-CAPILLARY: 147 mg/dL — AB (ref 65–99)
Glucose-Capillary: 126 mg/dL — ABNORMAL HIGH (ref 65–99)
Glucose-Capillary: 170 mg/dL — ABNORMAL HIGH (ref 65–99)

## 2017-04-04 LAB — HEPARIN LEVEL (UNFRACTIONATED)
HEPARIN UNFRACTIONATED: 0.83 [IU]/mL — AB (ref 0.30–0.70)
Heparin Unfractionated: 0.52 IU/mL (ref 0.30–0.70)

## 2017-04-04 LAB — CBC
HEMATOCRIT: 34.1 % — AB (ref 36.0–46.0)
HEMOGLOBIN: 11.1 g/dL — AB (ref 12.0–15.0)
MCH: 29 pg (ref 26.0–34.0)
MCHC: 32.6 g/dL (ref 30.0–36.0)
MCV: 89 fL (ref 78.0–100.0)
Platelets: 167 10*3/uL (ref 150–400)
RBC: 3.83 MIL/uL — AB (ref 3.87–5.11)
RDW: 13.9 % (ref 11.5–15.5)
WBC: 7.4 10*3/uL (ref 4.0–10.5)

## 2017-04-04 LAB — TROPONIN I
TROPONIN I: 6.3 ng/mL — AB (ref <0.03)
Troponin I: 4.92 ng/mL (ref <0.03)
Troponin I: 4.97 ng/mL (ref <0.03)

## 2017-04-04 LAB — MRSA PCR SCREENING: MRSA BY PCR: NEGATIVE

## 2017-04-04 MED ORDER — HEPARIN (PORCINE) IN NACL 100-0.45 UNIT/ML-% IJ SOLN
700.0000 [IU]/h | INTRAMUSCULAR | Status: DC
  Administered 2017-04-04 – 2017-04-06 (×2): 700 [IU]/h via INTRAVENOUS
  Filled 2017-04-04 (×2): qty 250

## 2017-04-04 MED ORDER — SODIUM BICARBONATE 8.4 % IV SOLN
INTRAVENOUS | Status: AC
  Filled 2017-04-04: qty 50

## 2017-04-04 NOTE — Progress Notes (Signed)
ANTICOAGULATION CONSULT NOTE - Follow Up Consult  Pharmacy Consult for Heparin Indication: chest pain/ACS  Allergies  Allergen Reactions  . Codeine Rash    Patient Measurements: Height: 5\' 6"  (167.6 cm) Weight: 147 lb 14.4 oz (67.1 kg) IBW/kg (Calculated) : 59.3  Vital Signs: Temp: 98.3 F (36.8 C) (07/07 0934) Temp Source: Oral (07/07 0934) BP: 128/70 (07/07 0934) Pulse Rate: 83 (07/07 0934)  Labs:  Recent Labs  04/03/17 1830 04/03/17 2310 04/04/17 0617  HGB 11.3*  --  11.1*  HCT 33.6*  --  34.1*  PLT 175  --  167  LABPROT 12.5  --   --   INR 0.93  --   --   HEPARINUNFRC  --   --  0.83*  CREATININE 1.29*  --  1.19*  TROPONINI  --  6.30* 4.92*    Estimated Creatinine Clearance: 35.3 mL/min (A) (by C-G formula based on SCr of 1.19 mg/dL (H)).   Medications:  Heparin @ 800 units/hr  Assessment: 82yof started on heparin yesterday for NSTEMI. Initial heparin level is supratherapeutic at 0.83. CBC stable. No bleeding. Noted plan for cath Monday.  Goal of Therapy:  Heparin level 0.3-0.7 units/ml Monitor platelets by anticoagulation protocol: Yes   Plan:  1) Decrease heparin to 700 units/hr 2) Check 8 hour heparin level  [name] 04/04/2017,10:23 AM

## 2017-04-04 NOTE — Progress Notes (Signed)
[place] for Heparin Indication: chest pain/ACS  Allergies  Allergen Reactions  . Codeine Rash    Patient Measurements: Height: 5\' 6"  (167.6 cm) Weight: 147 lb 14.4 oz (67.1 kg) IBW/kg (Calculated) : 59.3  Vital Signs: Temp: 98.3 F (36.8 C) (07/07 1606) Temp Source: Oral (07/07 1606) BP: 105/58 (07/07 1606) Pulse Rate: 75 (07/07 1606)  Labs:  Recent Labs  04/03/17 1830 04/03/17 2310 04/04/17 0617 04/04/17 0956 04/04/17 1828  HGB 11.3*  --  11.1*  --   --   HCT 33.6*  --  34.1*  --   --   PLT 175  --  167  --   --   LABPROT 12.5  --   --   --   --   INR 0.93  --   --   --   --   HEPARINUNFRC  --   --  0.83*  --  0.52  CREATININE 1.29*  --  1.19*  --   --   TROPONINI  --  6.30* 4.92* 4.97*  --     Estimated Creatinine Clearance: 35.3 mL/min (A) (by C-G formula based on SCr of 1.19 mg/dL (H)).   Assessment: 82yof started on heparin yesterday for NSTEMI. CBC stable. No bleeding. Noted plan for cath Monday. Heparin level is therapeutic after rate reduction.  Goal of Therapy:  Heparin level 0.3-0.7 units/ml Monitor platelets by anticoagulation protocol: Yes    Plan:  -Continue heparin at 700 units/hr -Daily HL, CBC   Maleeyah Mccaughey, Jake Church 04/04/2017,7:28 PM

## 2017-04-04 NOTE — Progress Notes (Signed)
Progress Note  Patient Name: Shawna Lynch Date of Encounter: 04/04/2017  Primary Cardiologist:     New (Dr. Percival Spanish)  Subjective   No further chest pain  Inpatient Medications    Scheduled Meds: . aspirin  324 mg Oral Once  . aspirin EC  81 mg Oral Daily  . atorvastatin  80 mg Oral q1800  . donepezil  10 mg Oral QHS  . insulin aspart  0-9 Units Subcutaneous TID WC  . lisinopril  20 mg Oral Daily  . memantine  28 mg Oral QHS  . metoprolol tartrate  25 mg Oral BID  . pantoprazole  40 mg Oral Daily  . sertraline  100 mg Oral Daily   Continuous Infusions: . heparin 800 Units/hr (04/04/17 0400)   PRN Meds: acetaminophen, nitroGLYCERIN, ondansetron (ZOFRAN) IV   Vital Signs    Vitals:   04/03/17 2348 04/04/17 0000 04/04/17 0300 04/04/17 0400  BP: 124/65 124/65  100/65  Pulse: 88 73  76  Resp:  17  15  Temp:  98.5 F (36.9 C) 98.1 F (36.7 C)   TempSrc:  Oral Oral   SpO2:  97%  92%  Weight:      Height:        Intake/Output Summary (Last 24 hours) at 04/04/17 0817 Last data filed at 04/04/17 0408  Gross per 24 hour  Intake            58.13 ml  Output              325 ml  Net          -266.87 ml   Filed Weights   04/03/17 1735 04/03/17 2247  Weight: 145 lb (65.8 kg) 147 lb 14.4 oz (67.1 kg)    Telemetry    NSR - Personally Reviewed  ECG    NSR, rate 77, no acute ST T wave changes.   - Personally Reviewed  Physical Exam   GEN: No acute distress.   Neck: No  JVD Cardiac: RRR, no murmurs, rubs, or gallops.  Respiratory: Clear  to auscultation bilaterally. GI: Soft, nontender, non-distended  MS: No  edema; No deformity. Neuro:  Nonfocal  Psych: Normal affect   Labs    Chemistry Recent Labs Lab 04/03/17 1830 04/04/17 0617  NA 135 138  K 4.4 4.4  CL 106 107  CO2 18* 23  GLUCOSE 132* 155*  BUN 18 15  CREATININE 1.29* 1.19*  CALCIUM 9.0 9.0  GFRNONAA 38* 42*  GFRAA 44* 49*  ANIONGAP 11 8     Hematology Recent Labs Lab  04/03/17 1830 04/04/17 0617  WBC 9.6 7.4  RBC 3.78* 3.83*  HGB 11.3* 11.1*  HCT 33.6* 34.1*  MCV 88.9 89.0  MCH 29.9 29.0  MCHC 33.6 32.6  RDW 13.6 13.9  PLT 175 167    Cardiac Enzymes Recent Labs Lab 04/03/17 2310 04/04/17 0617  TROPONINI 6.30* 4.92*    Recent Labs Lab 04/03/17 1837  TROPIPOC 3.87*     BNPNo results for input(s): BNP, PROBNP in the last 168 hours.   DDimer No results for input(s): DDIMER in the last 168 hours.   Radiology    Dg Chest 2 View  Result Date: 04/03/2017 CLINICAL DATA:  Acute left chest pain today. EXAM: CHEST  2 VIEW COMPARISON:  None. FINDINGS: The cardiomediastinal silhouette is unremarkable. There is no evidence of focal airspace disease, pulmonary edema, suspicious pulmonary nodule/mass, pleural effusion, or pneumothorax. No acute bony abnormalities are identified.  IMPRESSION: No active cardiopulmonary disease. Electronically Signed   By: Margarette Canada M.D.   On: 04/03/2017 18:25    Cardiac Studies   None  Patient Profile     82 y.o. female without prior cardiac history who was admitted with chest pain and found to have elevated cardiac enzymes.    Assessment & Plan     NSTEMI:  No active chest pain.  Continue heparin.  On ASA.   Cath electively on Monday.  The patient understands that risks included but are not limited to stroke (1 in 1000), death (1 in 88), kidney failure [usually temporary] (1 in 500), bleeding (1 in 200), allergic reaction [possibly serious] (1 in 200).  The patient understands and agrees to proceed.   Echo today.   DM:   SSI  CKD II :   Hydrate overnight on Sunday  HTN:  BP OK.  Continue current therapy.  ANEMIA:  No active bleeding.  Will guaiac stool.      Signed, Minus Breeding, MD  04/04/2017, 8:17 AM

## 2017-04-04 NOTE — Progress Notes (Signed)
  Echocardiogram 2D Echocardiogram has been performed.  Johny Chess 04/04/2017, 4:37 PM

## 2017-04-05 LAB — CBC
HCT: 32.1 % — ABNORMAL LOW (ref 36.0–46.0)
Hemoglobin: 10.3 g/dL — ABNORMAL LOW (ref 12.0–15.0)
MCH: 28.9 pg (ref 26.0–34.0)
MCHC: 32.1 g/dL (ref 30.0–36.0)
MCV: 89.9 fL (ref 78.0–100.0)
Platelets: 166 10*3/uL (ref 150–400)
RBC: 3.57 MIL/uL — AB (ref 3.87–5.11)
RDW: 13.9 % (ref 11.5–15.5)
WBC: 7.3 10*3/uL (ref 4.0–10.5)

## 2017-04-05 LAB — GLUCOSE, CAPILLARY
GLUCOSE-CAPILLARY: 155 mg/dL — AB (ref 65–99)
GLUCOSE-CAPILLARY: 150 mg/dL — AB (ref 65–99)
GLUCOSE-CAPILLARY: 180 mg/dL — AB (ref 65–99)
GLUCOSE-CAPILLARY: 129 mg/dL — AB (ref 65–99)

## 2017-04-05 LAB — HEPARIN LEVEL (UNFRACTIONATED): Heparin Unfractionated: 0.59 IU/mL (ref 0.30–0.70)

## 2017-04-05 LAB — HEMOGLOBIN A1C
HEMOGLOBIN A1C: 7.7 % — AB (ref 4.8–5.6)
MEAN PLASMA GLUCOSE: 174 mg/dL

## 2017-04-05 MED ORDER — SODIUM CHLORIDE 0.9 % WEIGHT BASED INFUSION
1.0000 mL/kg/h | INTRAVENOUS | Status: DC
  Administered 2017-04-05 – 2017-04-06 (×2): 1 mL/kg/h via INTRAVENOUS

## 2017-04-05 MED ORDER — SODIUM CHLORIDE 0.9% FLUSH
3.0000 mL | INTRAVENOUS | Status: DC | PRN
  Administered 2017-04-05: 3 mL via INTRAVENOUS
  Filled 2017-04-05: qty 3

## 2017-04-05 MED ORDER — ASPIRIN 81 MG PO CHEW
81.0000 mg | CHEWABLE_TABLET | ORAL | Status: AC
  Administered 2017-04-06: 81 mg via ORAL
  Filled 2017-04-05: qty 1

## 2017-04-05 MED ORDER — SODIUM CHLORIDE 0.9 % WEIGHT BASED INFUSION: 3.0000 mL/kg/h | INTRAVENOUS | Status: AC

## 2017-04-05 MED ORDER — SODIUM CHLORIDE 0.9 % IV SOLN: 250.0000 mL | INTRAVENOUS | Status: DC | PRN

## 2017-04-05 MED ORDER — SODIUM CHLORIDE 0.9% FLUSH: 3.0000 mL | Freq: Two times a day (BID) | INTRAVENOUS | Status: DC

## 2017-04-05 NOTE — Progress Notes (Signed)
ANTICOAGULATION CONSULT NOTE - Follow Up Consult  Pharmacy Consult for Heparin Indication: chest pain/ACS  Allergies  Allergen Reactions  . Codeine Rash    Patient Measurements: Height: 5\' 6"  (167.6 cm) Weight: 147 lb 8 oz (66.9 kg) IBW/kg (Calculated) : 59.3  Vital Signs: Temp: 97.5 F (36.4 C) (07/08 0746) Temp Source: Oral (07/08 0746) BP: 117/64 (07/08 0746) Pulse Rate: 65 (07/08 0800)  Labs:  Recent Labs  04/03/17 1830 04/03/17 2310 04/04/17 0617 04/04/17 0956 04/04/17 1828 04/05/17 0300  HGB 11.3*  --  11.1*  --   --  10.3*  HCT 33.6*  --  34.1*  --   --  32.1*  PLT 175  --  167  --   --  166  LABPROT 12.5  --   --   --   --   --   INR 0.93  --   --   --   --   --   HEPARINUNFRC  --   --  0.83*  --  0.52 0.59  CREATININE 1.29*  --  1.19*  --   --   --   TROPONINI  --  6.30* 4.92* 4.97*  --   --     Estimated Creatinine Clearance: 35.3 mL/min (A) (by C-G formula based on SCr of 1.19 mg/dL (H)).   Medications:  Heparin @ 700 units/hr  Assessment: Shawna Lynch continues on heparin for NSTEMI with plan for cath tomorrow. Heparin level is therapeutic at 0.59. Hgb down a little 11.1>10.3.  No bleeding - stool guaic pending.   Goal of Therapy:  Heparin level 0.3-0.7 units/ml Monitor platelets by anticoagulation protocol: Yes   Plan:  1) Continue heparin at 700 units/hr 2) Daily heparin level and CBC  [name] 04/05/2017,10:53 AM

## 2017-04-05 NOTE — Progress Notes (Signed)
Progress Note  Patient Name: Shawna Lynch Date of Encounter: 04/05/2017  Primary Cardiologist:     New (Dr. Percival Spanish)  Subjective   No further chest pain.  No SOB.    Inpatient Medications    Scheduled Meds: . aspirin  81 mg Oral Pre-Cath  . aspirin EC  81 mg Oral Daily  . atorvastatin  80 mg Oral q1800  . donepezil  10 mg Oral QHS  . insulin aspart  0-9 Units Subcutaneous TID WC  . lisinopril  20 mg Oral Daily  . memantine  28 mg Oral QHS  . metoprolol tartrate  25 mg Oral BID  . pantoprazole  40 mg Oral Daily  . sertraline  100 mg Oral Daily  . sodium chloride flush  3 mL Intravenous Q12H   Continuous Infusions: . sodium chloride    . sodium chloride    . heparin 700 Units/hr (04/05/17 0800)   PRN Meds: sodium chloride, acetaminophen, nitroGLYCERIN, ondansetron (ZOFRAN) IV, sodium chloride flush   Vital Signs    Vitals:   04/05/17 0325 04/05/17 0400 04/05/17 0746 04/05/17 0800  BP: (!) 101/38  117/64   Pulse: 68 69 71 65  Resp: 14 14 13 14   Temp: 97.8 F (36.6 C)  (!) 97.5 F (36.4 C)   TempSrc: Oral  Oral   SpO2: 97% 98% 98% 96%  Weight:  147 lb 8 oz (66.9 kg)    Height:        Intake/Output Summary (Last 24 hours) at 04/05/17 1016 Last data filed at 04/05/17 0800  Gross per 24 hour  Intake           611.55 ml  Output             1050 ml  Net          -438.45 ml   Filed Weights   04/03/17 1735 04/03/17 2247 04/05/17 0400  Weight: 145 lb (65.8 kg) 147 lb 14.4 oz (67.1 kg) 147 lb 8 oz (66.9 kg)    Telemetry    NSR and sinus tach - Personally Reviewed  ECG    NA.   - Personally Reviewed  Physical Exam   GEN: No  acute distress.   Neck: No  JVD Cardiac:  RRR, no murmurs, rubs, or gallops.  Respiratory: Clear   to auscultation bilaterally. GI: Soft, nontender, non-distended, normal bowel sounds  MS:  No edema; No deformity. Neuro:   Nonfocal  Psych: Oriented and appropriate   Labs    Chemistry  Recent Labs Lab 04/03/17 1830  04/04/17 0617  NA 135 138  K 4.4 4.4  CL 106 107  CO2 18* 23  GLUCOSE 132* 155*  BUN 18 15  CREATININE 1.29* 1.19*  CALCIUM 9.0 9.0  GFRNONAA 38* 42*  GFRAA 44* 49*  ANIONGAP 11 8     Hematology  Recent Labs Lab 04/03/17 1830 04/04/17 0617 04/05/17 0300  WBC 9.6 7.4 7.3  RBC 3.78* 3.83* 3.57*  HGB 11.3* 11.1* 10.3*  HCT 33.6* 34.1* 32.1*  MCV 88.9 89.0 89.9  MCH 29.9 29.0 28.9  MCHC 33.6 32.6 32.1  RDW 13.6 13.9 13.9  PLT 175 167 166    Cardiac Enzymes  Recent Labs Lab 04/03/17 2310 04/04/17 0617 04/04/17 0956  TROPONINI 6.30* 4.92* 4.97*     Recent Labs Lab 04/03/17 1837  TROPIPOC 3.87*     BNPNo results for input(s): BNP, PROBNP in the last 168 hours.   DDimer No results for  input(s): DDIMER in the last 168 hours.   Radiology    Dg Chest 2 View  Result Date: 04/03/2017 CLINICAL DATA:  Acute left chest pain today. EXAM: CHEST  2 VIEW COMPARISON:  None. FINDINGS: The cardiomediastinal silhouette is unremarkable. There is no evidence of focal airspace disease, pulmonary edema, suspicious pulmonary nodule/mass, pleural effusion, or pneumothorax. No acute bony abnormalities are identified. IMPRESSION: No active cardiopulmonary disease. Electronically Signed   By: Margarette Canada M.D.   On: 04/03/2017 18:25    Cardiac Studies   ECHO:  04/04/17 - Left ventricle: The cavity size was normal. Wall thickness was   increased in a pattern of mild LVH. Systolic function was normal.   The estimated ejection fraction was in the range of 55% to 60%.   Wall motion was normal; there were no regional wall motion   abnormalities. - Aortic valve: Mildly calcified annulus. Trileaflet; normal   thickness leaflets. - Mitral valve: Mildly calcified annulus. - Pulmonary arteries: Systolic pressure was mildly increased. PA   peak pressure: 34 mm Hg (S).   Patient Profile     82 y.o. female without prior cardiac history who was admitted with chest pain and found to have  elevated cardiac enzymes.    Assessment & Plan     NSTEMI:  No active chest pain.  Continue heparin.  On ASA.   Cath electively on Monday.     DM:   SSI.  Blood sugars controlled.   CKD II :   Hydrate overnight on Sunday  HTN:  BP controlled.  Continue current therapy.    ANEMIA:  She does not report bleeding.  Hgb is down slightly today.  Stool guaiac pending.      Signed, Minus Breeding, MD  04/05/2017, 10:16 AM

## 2017-04-06 ENCOUNTER — Encounter: Payer: Self-pay | Admitting: *Deleted

## 2017-04-06 ENCOUNTER — Encounter (HOSPITAL_COMMUNITY): Payer: Self-pay | Admitting: General Practice

## 2017-04-06 ENCOUNTER — Encounter (HOSPITAL_COMMUNITY)
Admission: EM | Disposition: A | Discharge status: Home / Self Care | Payer: Self-pay | Source: Home / Self Care | Attending: Cardiovascular Disease

## 2017-04-06 ENCOUNTER — Other Ambulatory Visit: Payer: Self-pay

## 2017-04-06 DIAGNOSIS — E119 Type 2 diabetes mellitus without complications: Secondary | ICD-10-CM | POA: Diagnosis present

## 2017-04-06 DIAGNOSIS — I10 Essential (primary) hypertension: Secondary | ICD-10-CM | POA: Diagnosis present

## 2017-04-06 DIAGNOSIS — I2510 Atherosclerotic heart disease of native coronary artery without angina pectoris: Secondary | ICD-10-CM

## 2017-04-06 HISTORY — PX: LEFT HEART CATH AND CORONARY ANGIOGRAPHY: CATH118249

## 2017-04-06 HISTORY — PX: CORONARY STENT INTERVENTION: CATH118234

## 2017-04-06 LAB — BASIC METABOLIC PANEL
ANION GAP: 6 (ref 5–15)
BUN: 13 mg/dL (ref 6–20)
CALCIUM: 8.7 mg/dL — AB (ref 8.9–10.3)
CO2: 23 mmol/L (ref 22–32)
CREATININE: 1.33 mg/dL — AB (ref 0.44–1.00)
Chloride: 109 mmol/L (ref 101–111)
GFR, EST AFRICAN AMERICAN: 43 mL/min — AB (ref >60)
GFR, EST NON AFRICAN AMERICAN: 37 mL/min — AB (ref >60)
Glucose, Bld: 155 mg/dL — ABNORMAL HIGH (ref 65–99)
Potassium: 3.9 mmol/L (ref 3.5–5.1)
SODIUM: 138 mmol/L (ref 135–145)

## 2017-04-06 LAB — CBC
HCT: 31.7 % — ABNORMAL LOW (ref 36.0–46.0)
Hemoglobin: 10.2 g/dL — ABNORMAL LOW (ref 12.0–15.0)
MCH: 29.6 pg (ref 26.0–34.0)
MCHC: 32.2 g/dL (ref 30.0–36.0)
MCV: 91.9 fL (ref 78.0–100.0)
PLATELETS: 170 10*3/uL (ref 150–400)
RBC: 3.45 MIL/uL — AB (ref 3.87–5.11)
RDW: 14.2 % (ref 11.5–15.5)
WBC: 7.2 10*3/uL (ref 4.0–10.5)

## 2017-04-06 LAB — HEPARIN LEVEL (UNFRACTIONATED): HEPARIN UNFRACTIONATED: 0.66 [IU]/mL (ref 0.30–0.70)

## 2017-04-06 LAB — GLUCOSE, CAPILLARY
Glucose-Capillary: 149 mg/dL — ABNORMAL HIGH (ref 65–99)
Glucose-Capillary: 129 mg/dL — ABNORMAL HIGH (ref 65–99)

## 2017-04-06 LAB — POCT ACTIVATED CLOTTING TIME
ACTIVATED CLOTTING TIME: 252 s
Activated Clotting Time: 230 seconds

## 2017-04-06 SURGERY — LEFT HEART CATH AND CORONARY ANGIOGRAPHY
Anesthesia: LOCAL

## 2017-04-06 MED ORDER — LISINOPRIL 10 MG PO TABS
20.0000 mg | ORAL_TABLET | Freq: Every day | ORAL | Status: DC
  Administered 2017-04-07: 08:00:00 20 mg via ORAL
  Filled 2017-04-06: qty 2

## 2017-04-06 MED ORDER — IOPAMIDOL (ISOVUE-370) INJECTION 76%
INTRAVENOUS | Status: DC | PRN
  Administered 2017-04-06: 130 mL via INTRA_ARTERIAL

## 2017-04-06 MED ORDER — SODIUM CHLORIDE 0.9% FLUSH: 3.0000 mL | INTRAVENOUS | Status: DC | PRN

## 2017-04-06 MED ORDER — IOPAMIDOL (ISOVUE-370) INJECTION 76%
INTRAVENOUS | Status: AC
  Filled 2017-04-06: qty 50

## 2017-04-06 MED ORDER — SODIUM CHLORIDE 0.9% FLUSH: 3.0000 mL | Freq: Two times a day (BID) | INTRAVENOUS | Status: DC

## 2017-04-06 MED ORDER — HEPARIN (PORCINE) IN NACL 2-0.9 UNIT/ML-% IJ SOLN
INTRAMUSCULAR | Status: AC
  Filled 2017-04-06: qty 1000

## 2017-04-06 MED ORDER — NITROGLYCERIN 1 MG/10 ML FOR IR/CATH LAB
INTRA_ARTERIAL | Status: DC | PRN
  Administered 2017-04-06: 150 ug via INTRACORONARY
  Administered 2017-04-06: 100 ug via INTRACORONARY

## 2017-04-06 MED ORDER — HYDRALAZINE HCL 20 MG/ML IJ SOLN: 5.0000 mg | INTRAMUSCULAR | Status: AC | PRN

## 2017-04-06 MED ORDER — HEPARIN (PORCINE) IN NACL 2-0.9 UNIT/ML-% IJ SOLN
INTRAMUSCULAR | Status: DC | PRN
  Administered 2017-04-06: 10 mL via INTRA_ARTERIAL

## 2017-04-06 MED ORDER — HEPARIN (PORCINE) IN NACL 2-0.9 UNIT/ML-% IJ SOLN
INTRAMUSCULAR | Status: AC | PRN
Start: 2017-04-06 — End: 2017-04-06
  Administered 2017-04-06: 1000 mL

## 2017-04-06 MED ORDER — MIDAZOLAM HCL 2 MG/2ML IJ SOLN
INTRAMUSCULAR | Status: DC | PRN
  Administered 2017-04-06: 1 mg via INTRAVENOUS

## 2017-04-06 MED ORDER — FENTANYL CITRATE (PF) 100 MCG/2ML IJ SOLN
INTRAMUSCULAR | Status: AC
Start: 2017-04-06 — End: 2017-04-06
  Filled 2017-04-06: qty 2

## 2017-04-06 MED ORDER — LIDOCAINE HCL (PF) 1 % IJ SOLN
INTRAMUSCULAR | Status: DC | PRN
  Administered 2017-04-06: 2 mL

## 2017-04-06 MED ORDER — LIDOCAINE HCL 1 % IJ SOLN
INTRAMUSCULAR | Status: AC
  Filled 2017-04-06: qty 20

## 2017-04-06 MED ORDER — HEPARIN SODIUM (PORCINE) 1000 UNIT/ML IJ SOLN
INTRAMUSCULAR | Status: DC | PRN
  Administered 2017-04-06: 1000 [IU] via INTRAVENOUS
  Administered 2017-04-06: 2000 [IU] via INTRAVENOUS
  Administered 2017-04-06: 4000 [IU] via INTRAVENOUS
  Administered 2017-04-06: 2000 [IU] via INTRAVENOUS

## 2017-04-06 MED ORDER — NITROGLYCERIN 1 MG/10 ML FOR IR/CATH LAB
INTRA_ARTERIAL | Status: AC
  Filled 2017-04-06: qty 10

## 2017-04-06 MED ORDER — SODIUM CHLORIDE 0.9 % WEIGHT BASED INFUSION
1.0000 mL/kg/h | INTRAVENOUS | Status: AC
  Administered 2017-04-06: 1 mL/kg/h via INTRAVENOUS

## 2017-04-06 MED ORDER — FENTANYL CITRATE (PF) 100 MCG/2ML IJ SOLN
INTRAMUSCULAR | Status: DC | PRN
  Administered 2017-04-06: 25 ug via INTRAVENOUS

## 2017-04-06 MED ORDER — VERAPAMIL HCL 2.5 MG/ML IV SOLN
INTRAVENOUS | Status: AC
  Filled 2017-04-06: qty 2

## 2017-04-06 MED ORDER — HEPARIN SODIUM (PORCINE) 1000 UNIT/ML IJ SOLN
INTRAMUSCULAR | Status: AC
  Filled 2017-04-06: qty 1

## 2017-04-06 MED ORDER — CLOPIDOGREL BISULFATE 300 MG PO TABS
ORAL_TABLET | ORAL | Status: AC
  Filled 2017-04-06: qty 2

## 2017-04-06 MED ORDER — SODIUM CHLORIDE 0.9 % IV SOLN
INTRAVENOUS | Status: AC | PRN
  Administered 2017-04-06: 67 mL/h via INTRAVENOUS

## 2017-04-06 MED ORDER — SODIUM CHLORIDE 0.9 % IV SOLN: 250.0000 mL | INTRAVENOUS | Status: DC | PRN

## 2017-04-06 MED ORDER — MIDAZOLAM HCL 2 MG/2ML IJ SOLN
INTRAMUSCULAR | Status: AC
  Filled 2017-04-06: qty 2

## 2017-04-06 MED ORDER — IOPAMIDOL (ISOVUE-370) INJECTION 76%
INTRAVENOUS | Status: AC
  Filled 2017-04-06: qty 100

## 2017-04-06 MED ORDER — CLOPIDOGREL BISULFATE 75 MG PO TABS
75.0000 mg | ORAL_TABLET | Freq: Every day | ORAL | Status: DC
  Administered 2017-04-07: 08:00:00 75 mg via ORAL
  Filled 2017-04-06: qty 1

## 2017-04-06 MED ORDER — LABETALOL HCL 5 MG/ML IV SOLN: 10.0000 mg | INTRAVENOUS | Status: AC | PRN

## 2017-04-06 MED ORDER — CLOPIDOGREL BISULFATE 300 MG PO TABS
ORAL_TABLET | ORAL | Status: DC | PRN
  Administered 2017-04-06: 600 mg via ORAL

## 2017-04-06 SURGICAL SUPPLY — 16 items
BALLN EMERGE MR 2.0X12 (BALLOONS) ×2
BALLOON EMERGE MR 2.0X12 (BALLOONS) IMPLANT
CATH 5FR JL3.5 JR4 ANG PIG MP (CATHETERS) ×1 IMPLANT
CATH INFINITI 5 FR AR1 MOD (CATHETERS) ×1 IMPLANT
CATH LAUNCHER 5F EBU3.5 (CATHETERS) ×1 IMPLANT
DEVICE RAD COMP TR BAND LRG (VASCULAR PRODUCTS) ×1 IMPLANT
GLIDESHEATH SLEND SS 6F .021 (SHEATH) ×1 IMPLANT
GUIDEWIRE INQWIRE 1.5J.035X260 (WIRE) IMPLANT
INQWIRE 1.5J .035X260CM (WIRE) ×2
KIT ENCORE 26 ADVANTAGE (KITS) ×1 IMPLANT
KIT HEART LEFT (KITS) ×2 IMPLANT
PACK CARDIAC CATHETERIZATION (CUSTOM PROCEDURE TRAY) ×2 IMPLANT
STENT RESOLUTE ONYX 2.0X12 (Permanent Stent) ×1 IMPLANT
TRANSDUCER W/STOPCOCK (MISCELLANEOUS) ×2 IMPLANT
TUBING CIL FLEX 10 FLL-RA (TUBING) ×2 IMPLANT
WIRE COUGAR XT STRL 190CM (WIRE) ×1 IMPLANT

## 2017-04-06 NOTE — Progress Notes (Deleted)
Patient was seen for [place] study visit. Patient is doing well. Labs were drawn per study protocol. Patient will be seen on Jul 27, 2017 at 10:00 am for [facility] 270 day visit.

## 2017-04-06 NOTE — Progress Notes (Signed)
TR BAND REMOVAL  LOCATION:  right radial  DEFLATED PER PROTOCOL:  Yes.    TIME BAND OFF / DRESSING APPLIED:   1445   SITE UPON ARRIVAL:   Level 1  SITE AFTER BAND REMOVAL:  Level 1  CIRCULATION SENSATION AND MOVEMENT:  Within Normal Limits  Yes.    COMMENTS:  After second deflation moderate external bleed occurred with development of small hematoma, 3 X 3.5 cm.  During deflation process, manual pressure applied to hematoma twice X 10 minutes, ice applied, and coban wrap applied.  Margins marked, soft and bruised at this time

## 2017-04-06 NOTE — H&P (View-Only) (Signed)
Progress Note  Patient Name: Shawna Lynch Date of Encounter: 04/05/2017  Primary Cardiologist:     New (Dr. Percival Spanish)  Subjective   No further chest pain.  No SOB.    Inpatient Medications    Scheduled Meds: . aspirin  81 mg Oral Pre-Cath  . aspirin EC  81 mg Oral Daily  . atorvastatin  80 mg Oral q1800  . donepezil  10 mg Oral QHS  . insulin aspart  0-9 Units Subcutaneous TID WC  . lisinopril  20 mg Oral Daily  . memantine  28 mg Oral QHS  . metoprolol tartrate  25 mg Oral BID  . pantoprazole  40 mg Oral Daily  . sertraline  100 mg Oral Daily  . sodium chloride flush  3 mL Intravenous Q12H   Continuous Infusions: . sodium chloride    . sodium chloride    . heparin 700 Units/hr (04/05/17 0800)   PRN Meds: sodium chloride, acetaminophen, nitroGLYCERIN, ondansetron (ZOFRAN) IV, sodium chloride flush   Vital Signs    Vitals:   04/05/17 0325 04/05/17 0400 04/05/17 0746 04/05/17 0800  BP: (!) 101/38  117/64   Pulse: 68 69 71 65  Resp: 14 14 13 14   Temp: 97.8 F (36.6 C)  (!) 97.5 F (36.4 C)   TempSrc: Oral  Oral   SpO2: 97% 98% 98% 96%  Weight:  147 lb 8 oz (66.9 kg)    Height:        Intake/Output Summary (Last 24 hours) at 04/05/17 1016 Last data filed at 04/05/17 0800  Gross per 24 hour  Intake           611.55 ml  Output             1050 ml  Net          -438.45 ml   Filed Weights   04/03/17 1735 04/03/17 2247 04/05/17 0400  Weight: 145 lb (65.8 kg) 147 lb 14.4 oz (67.1 kg) 147 lb 8 oz (66.9 kg)    Telemetry    NSR and sinus tach - Personally Reviewed  ECG    NA.   - Personally Reviewed  Physical Exam   GEN: No  acute distress.   Neck: No  JVD Cardiac:  RRR, no murmurs, rubs, or gallops.  Respiratory: Clear   to auscultation bilaterally. GI: Soft, nontender, non-distended, normal bowel sounds  MS:  No edema; No deformity. Neuro:   Nonfocal  Psych: Oriented and appropriate   Labs    Chemistry  Recent Labs Lab 04/03/17 1830  04/04/17 0617  NA 135 138  K 4.4 4.4  CL 106 107  CO2 18* 23  GLUCOSE 132* 155*  BUN 18 15  CREATININE 1.29* 1.19*  CALCIUM 9.0 9.0  GFRNONAA 38* 42*  GFRAA 44* 49*  ANIONGAP 11 8     Hematology  Recent Labs Lab 04/03/17 1830 04/04/17 0617 04/05/17 0300  WBC 9.6 7.4 7.3  RBC 3.78* 3.83* 3.57*  HGB 11.3* 11.1* 10.3*  HCT 33.6* 34.1* 32.1*  MCV 88.9 89.0 89.9  MCH 29.9 29.0 28.9  MCHC 33.6 32.6 32.1  RDW 13.6 13.9 13.9  PLT 175 167 166    Cardiac Enzymes  Recent Labs Lab 04/03/17 2310 04/04/17 0617 04/04/17 0956  TROPONINI 6.30* 4.92* 4.97*     Recent Labs Lab 04/03/17 1837  TROPIPOC 3.87*     BNPNo results for input(s): BNP, PROBNP in the last 168 hours.   DDimer No results for  input(s): DDIMER in the last 168 hours.   Radiology    Dg Chest 2 View  Result Date: 04/03/2017 CLINICAL DATA:  Acute left chest pain today. EXAM: CHEST  2 VIEW COMPARISON:  None. FINDINGS: The cardiomediastinal silhouette is unremarkable. There is no evidence of focal airspace disease, pulmonary edema, suspicious pulmonary nodule/mass, pleural effusion, or pneumothorax. No acute bony abnormalities are identified. IMPRESSION: No active cardiopulmonary disease. Electronically Signed   By: Margarette Canada M.D.   On: 04/03/2017 18:25    Cardiac Studies   ECHO:  04/04/17 - Left ventricle: The cavity size was normal. Wall thickness was   increased in a pattern of mild LVH. Systolic function was normal.   The estimated ejection fraction was in the range of 55% to 60%.   Wall motion was normal; there were no regional wall motion   abnormalities. - Aortic valve: Mildly calcified annulus. Trileaflet; normal   thickness leaflets. - Mitral valve: Mildly calcified annulus. - Pulmonary arteries: Systolic pressure was mildly increased. PA   peak pressure: 34 mm Hg (S).   Patient Profile     82 y.o. female without prior cardiac history who was admitted with chest pain and found to have  elevated cardiac enzymes.    Assessment & Plan     NSTEMI:  No active chest pain.  Continue heparin.  On ASA.   Cath electively on Monday.     DM:   SSI.  Blood sugars controlled.   CKD II :   Hydrate overnight on Sunday  HTN:  BP controlled.  Continue current therapy.    ANEMIA:  She does not report bleeding.  Hgb is down slightly today.  Stool guaiac pending.      Signed, Minus Breeding, MD  04/05/2017, 10:16 AM

## 2017-04-06 NOTE — Interval H&P Note (Signed)
Cath Lab Visit (complete for each Cath Lab visit)  Clinical Evaluation Leading to the Procedure:   ACS: Yes.    Non-ACS:    Anginal Classification: CCS IV  Anti-ischemic medical therapy: Minimal Therapy (1 class of medications)  Non-Invasive Test Results: No non-invasive testing performed  Prior CABG: No previous CABG      History and Physical Interval Note:  04/06/2017 7:57 AM  Shawna Lynch  has presented today for surgery, with the diagnosis of NSTEMI  The various methods of treatment have been discussed with the patient and family. After consideration of risks, benefits and other options for treatment, the patient has consented to  Procedure(s): Left Heart Cath and Coronary Angiography (N/A) as a surgical intervention .  The patient's history has been reviewed, patient examined, no change in status, stable for surgery.  I have reviewed the patient's chart and labs.  Questions were answered to the patient's satisfaction.     Sherren Mocha

## 2017-04-06 NOTE — Care Management Note (Signed)
Case Management Note  Patient Details  Name: KALANA YUST MRN: [id number] Date of Birth: [birth year]  Subjective/Objective:   From home alone, pta indep, she has PCP and medication coverage, s/p coronary stent intervention, will be on plavix.                   Action/Plan: NCM will follow for dc needs.   Expected Discharge Date:  04/08/17               Expected Discharge Plan:  Home/Self Care  In-House Referral:     Discharge planning Services  CM Consult  Post Acute Care Choice:    Choice offered to:     DME Arranged:    DME Agency:     HH Arranged:    HH Agency:     Status of Service:  Completed, signed off  If discussed at [employer] of Stay Meetings, dates discussed:    Additional Comments:  Zenon Mayo, RN 04/06/2017, 3:40 PM

## 2017-04-07 ENCOUNTER — Encounter: Payer: Self-pay | Admitting: *Deleted

## 2017-04-07 ENCOUNTER — Telehealth: Payer: Self-pay

## 2017-04-07 ENCOUNTER — Encounter (HOSPITAL_COMMUNITY): Payer: Self-pay | Admitting: Cardiology

## 2017-04-07 DIAGNOSIS — E784 Other hyperlipidemia: Secondary | ICD-10-CM

## 2017-04-07 DIAGNOSIS — I10 Essential (primary) hypertension: Secondary | ICD-10-CM

## 2017-04-07 DIAGNOSIS — E118 Type 2 diabetes mellitus with unspecified complications: Secondary | ICD-10-CM

## 2017-04-07 DIAGNOSIS — E785 Hyperlipidemia, unspecified: Secondary | ICD-10-CM

## 2017-04-07 DIAGNOSIS — Z006 Encounter for examination for normal comparison and control in clinical research program: Secondary | ICD-10-CM

## 2017-04-07 DIAGNOSIS — Z955 Presence of coronary angioplasty implant and graft: Secondary | ICD-10-CM

## 2017-04-07 LAB — BASIC METABOLIC PANEL
ANION GAP: 8 (ref 5–15)
BUN: 13 mg/dL (ref 6–20)
CHLORIDE: 107 mmol/L (ref 101–111)
CO2: 22 mmol/L (ref 22–32)
CREATININE: 1.26 mg/dL — AB (ref 0.44–1.00)
Calcium: 8.8 mg/dL — ABNORMAL LOW (ref 8.9–10.3)
GFR calc Af Amer: 45 mL/min — ABNORMAL LOW (ref >60)
GFR calc non Af Amer: 39 mL/min — ABNORMAL LOW (ref >60)
Glucose, Bld: 184 mg/dL — ABNORMAL HIGH (ref 65–99)
POTASSIUM: 4.2 mmol/L (ref 3.5–5.1)
SODIUM: 137 mmol/L (ref 135–145)

## 2017-04-07 LAB — CBC
HEMATOCRIT: 30.4 % — AB (ref 36.0–46.0)
HEMOGLOBIN: 9.9 g/dL — AB (ref 12.0–15.0)
MCH: 29.4 pg (ref 26.0–34.0)
MCHC: 32.6 g/dL (ref 30.0–36.0)
MCV: 90.2 fL (ref 78.0–100.0)
PLATELETS: 168 10*3/uL (ref 150–400)
RBC: 3.37 MIL/uL — AB (ref 3.87–5.11)
RDW: 13.9 % (ref 11.5–15.5)
WBC: 8 10*3/uL (ref 4.0–10.5)

## 2017-04-07 LAB — GLUCOSE, CAPILLARY
Glucose-Capillary: 151 mg/dL — ABNORMAL HIGH (ref 65–99)
Glucose-Capillary: 185 mg/dL — ABNORMAL HIGH (ref 65–99)

## 2017-04-07 MED ORDER — PANTOPRAZOLE SODIUM 40 MG PO TBEC: 40.0000 mg | DELAYED_RELEASE_TABLET | Freq: Every day | ORAL | 0 refills | Status: DC

## 2017-04-07 MED ORDER — ASPIRIN 81 MG PO TBEC: 81.0000 mg | DELAYED_RELEASE_TABLET | Freq: Every day | ORAL | Status: AC

## 2017-04-07 MED ORDER — NITROGLYCERIN 0.4 MG SL SUBL: 0.4000 mg | SUBLINGUAL_TABLET | SUBLINGUAL | 2 refills | Status: AC | PRN

## 2017-04-07 MED ORDER — LISINOPRIL 40 MG PO TABS: 20.0000 mg | ORAL_TABLET | Freq: Every day | ORAL | 0 refills | Status: DC

## 2017-04-07 MED ORDER — ATORVASTATIN CALCIUM 80 MG PO TABS: 80.0000 mg | ORAL_TABLET | Freq: Every day | ORAL | 1 refills | Status: DC

## 2017-04-07 MED ORDER — ANGIOPLASTY BOOK
Freq: Once | Status: AC
  Administered 2017-04-07: 07:00:00
  Filled 2017-04-07: qty 1

## 2017-04-07 MED ORDER — ACTIVE PARTNERSHIP FOR HEALTH OF YOUR HEART BOOK
Freq: Once | Status: AC
  Administered 2017-04-07: 07:00:00
  Filled 2017-04-07: qty 1

## 2017-04-07 MED ORDER — CLOPIDOGREL BISULFATE 75 MG PO TABS: 75.0000 mg | ORAL_TABLET | Freq: Every day | ORAL | 3 refills | Status: DC

## 2017-04-07 NOTE — Telephone Encounter (Signed)
Per review of Pt chart, Pt to discharge today 04/07/2017.  Will continue to follow for TCM needs.

## 2017-04-07 NOTE — Progress Notes (Signed)
Encounter done in error. Patient is not in a research study.

## 2017-04-07 NOTE — Discharge Summary (Signed)
Discharge Summary    Patient ID: Shawna Lynch,  MRN: [id number], DOB/AGE: [birth year] 82 y.o.  Admit date: 04/03/2017 Discharge date: 04/07/2017  Primary Care Provider: Cyndi Bender Primary Cardiologist: H[place]   Discharge Diagnoses    Principal Problem:   NSTEMI (non-ST elevated myocardial infarction) [facility]) Active Problems:   DM (diabetes mellitus) with complications ([place])   Essential hypertension   Hyperlipidemia   Allergies Allergies  Allergen Reactions  . Codeine Rash    Diagnostic Studies/Procedures    LHC: 04/06/17  Conclusion   1. Severe stenosis of the mid left circumflex treated successfully with PCI using a small drug-eluting stent platform 2. Severe stenosis of the small diagonal branch of the LAD appropriate for medical therapy 3. Widely patent right coronary artery with mild nonobstructive plaquing 4. Known normal LV function by echo assessment  Recommendation: Dual antiplatelet therapy with aspirin and clopidogrel for 12 months. Medical therapy for further risk reduction.   TTE: 04/04/17  Study Conclusions  - Left ventricle: The cavity size was normal. Wall thickness was   increased in a pattern of mild LVH. Systolic function was normal.   The estimated ejection fraction was in the range of 55% to 60%.   Wall motion was normal; there were no regional wall motion   abnormalities. - Aortic valve: Mildly calcified annulus. Trileaflet; normal   thickness leaflets. - Mitral valve: Mildly calcified annulus. - Pulmonary arteries: Systolic pressure was mildly increased. PA   peak pressure: 34 mm Hg (S). _____________   History of Present Illness     82 y.o. old female with medical history noted below who presents with acute complaints of chest pain.  The patient has some baseline dementia by her own admission and has difficulty describing her presenting complaint.  The history was partially provided by the patient's son.  She was at a friends house  earlier in the evening when she developed onset of chest pain.  Per the ED notes, this pain started the day prior to admission, however, she could not recall when it started.The pain was localized to the left breast and felt as though someone was squeezing her chest.  She didn't remember if she had dyspnea, but denied lightheadedness, nausea, or diaphoresis.  She has never had chest discomfort like this before.  She was given ASA on the ambulance ride and on my exam she is currently chest pain free.  She denied history of CAD, MI, or CHF.  No acute CHF symptoms.  Not currently short of breath.  No recent rapid weight gain. She stated she has occasional "fluttering" in her chest.  None associated with this episode of chest pain.    Hospital Course     She was admitted and trop peaked at 6.30. Placed on IV heparin and IVFs for hydration. Noted anemia this admission, but FOBT negative. She was prepped for cath the following Monday. Underwent LHC with Dr. Burt Knack noted above with PCI and DES to the mLCx, severe stenosis in small diag branch of the LAD, and other nonobstructive disease noted in the LAD and RCA to be treated medically. Normal LV function noted on echo. Plan for DAPT with ASA/plavix for at least 12 months. Her Zocor was changed to high dose Lipitor this admission. Post cath labs were stable with Cr 1.26, and Hgb 9.9. Hgb A1c 7.7. Worked with cardiac rehab without any difficulty. She was continue on her home dose of coreg, but lisinopril was reduced to 20mg  from 40mg   daily.     She was seen by Dr. Ellyn Hack and determined stable for discharge home. Follow up in the office has been arranged. Medications are listed below.  _____________  Discharge Vitals Blood pressure (!) 134/58, pulse 70, temperature 98.5 F (36.9 C), temperature source Oral, resp. rate 18, height 5\' 6"  (1.676 m), weight 145 lb 4.5 oz (65.9 kg), SpO2 97 %.  Filed Weights   04/03/17 2247 04/05/17 0400 04/07/17 0318  Weight: 147  lb 14.4 oz (67.1 kg) 147 lb 8 oz (66.9 kg) 145 lb 4.5 oz (65.9 kg)    Labs & Radiologic Studies    CBC  Recent Labs  04/06/17 0249 04/07/17 0214  WBC 7.2 8.0  HGB 10.2* 9.9*  HCT 31.7* 30.4*  MCV 91.9 90.2  PLT 170 001   Basic Metabolic Panel  Recent Labs  04/06/17 0249 04/07/17 0214  NA 138 137  K 3.9 4.2  CL 109 107  CO2 23 22  GLUCOSE 155* 184*  BUN 13 13  CREATININE 1.33* 1.26*  CALCIUM 8.7* 8.8*   Liver Function Tests No results for input(s): AST, ALT, ALKPHOS, BILITOT, PROT, ALBUMIN in the last 72 hours. No results for input(s): LIPASE, AMYLASE in the last 72 hours. Cardiac Enzymes  Recent Labs  04/04/17 0956  TROPONINI 4.97*   BNP Invalid input(s): POCBNP D-Dimer No results for input(s): DDIMER in the last 72 hours. Hemoglobin A1C No results for input(s): HGBA1C in the last 72 hours. Fasting Lipid Panel No results for input(s): CHOL, HDL, LDLCALC, TRIG, CHOLHDL, LDLDIRECT in the last 72 hours. Thyroid Function Tests No results for input(s): TSH, T4TOTAL, T3FREE, THYROIDAB in the last 72 hours.  Invalid input(s): FREET3 _____________  Dg Chest 2 View  Result Date: 04/03/2017 CLINICAL DATA:  Acute left chest pain today. EXAM: CHEST  2 VIEW COMPARISON:  None. FINDINGS: The cardiomediastinal silhouette is unremarkable. There is no evidence of focal airspace disease, pulmonary edema, suspicious pulmonary nodule/mass, pleural effusion, or pneumothorax. No acute bony abnormalities are identified. IMPRESSION: No active cardiopulmonary disease. Electronically Signed   By: Margarette Canada M.D.   On: 04/03/2017 18:25   Disposition   Pt is being discharged home today in good condition.  Follow-up Plans & Appointments    Follow-up Information    Erlene Quan, PA-C Follow up on 04/27/2017.   Specialties:  Cardiology, Radiology Why:  at 8;30am for your follow up appt.  Contact information: [address] [place] [address] [phone]          Cyndi Bender, PA-C Follow up.   Specialty:  Physician Assistant Why:  Please continue to follow up with your regular doc regarding your diabetes management.  Contact information: [place] [address] [phone]          Discharge Instructions    Call MD for:  redness, tenderness, or signs of infection (pain, swelling, redness, odor or green/yellow discharge around incision site)    Complete by:  As directed    Diet - low sodium heart healthy    Complete by:  As directed    Discharge instructions    Complete by:  As directed    Radial Site Care Refer to this sheet in the next few weeks. These instructions provide you with information on caring for yourself after your procedure. Your caregiver may also give you more specific instructions. Your treatment has been planned according to current medical practices, but problems sometimes occur. Call your caregiver if  you have any problems or questions after your procedure. HOME CARE INSTRUCTIONS You may shower the day after the procedure.Remove the bandage (dressing) and gently wash the site with plain soap and water.Gently pat the site dry.  Do not apply powder or lotion to the site.  Do not submerge the affected site in water for 3 to 5 days.  Inspect the site at least twice daily.  Do not flex or bend the affected arm for 24 hours.  No lifting over 5 pounds (2.3 kg) for 5 days after your procedure.  Do not drive home if you are discharged the same day of the procedure. Have someone else drive you.  You may drive 24 hours after the procedure unless otherwise instructed by your caregiver.  What to expect: Any bruising will usually fade within 1 to 2 weeks.  Blood that collects in the tissue (hematoma) may be painful to the touch. It should usually decrease in size and tenderness within 1 to 2 weeks.  SEEK IMMEDIATE MEDICAL CARE IF: You have unusual pain at the radial site.  You have redness, warmth, swelling,  or pain at the radial site.  You have drainage (other than a small amount of blood on the dressing).  You have chills.  You have a fever or persistent symptoms for more than 72 hours.  You have a fever and your symptoms suddenly get worse.  Your arm becomes pale, cool, tingly, or numb.  You have heavy bleeding from the site. Hold pressure on the site.   PLEASE DO NOT MISS ANY DOSES OF YOUR PLAVIX!!!!   Increase activity slowly    Complete by:  As directed       Discharge Medications   Current Discharge Medication List    START taking these medications   Details  aspirin EC 81 MG EC tablet Take 1 tablet (81 mg total) by mouth daily. Qty: 30 tablet    atorvastatin (LIPITOR) 80 MG tablet Take 1 tablet (80 mg total) by mouth daily at 6 PM. Qty: 90 tablet, Refills: 1    clopidogrel (PLAVIX) 75 MG tablet Take 1 tablet (75 mg total) by mouth daily with breakfast. Qty: 90 tablet, Refills: 3    nitroGLYCERIN (NITROSTAT) 0.4 MG SL tablet Place 1 tablet (0.4 mg total) under the tongue every 5 (five) minutes x 3 doses as needed for chest pain. Qty: 25 tablet, Refills: 2    pantoprazole (PROTONIX) 40 MG tablet Take 1 tablet (40 mg total) by mouth daily. Qty: 90 tablet, Refills: 0      CONTINUE these medications which have CHANGED   Details  lisinopril (PRINIVIL,ZESTRIL) 40 MG tablet Take 0.5 tablets (20 mg total) by mouth daily. Qty: 90 tablet, Refills: 0      CONTINUE these medications which have NOT CHANGED   Details  carvedilol (COREG) 25 MG tablet Take 12.5 mg by mouth 2 (two) times daily with a meal.    donepezil (ARICEPT) 10 MG tablet Take 10 mg by mouth at bedtime.    memantine (NAMENDA XR) 28 MG CP24 24 hr capsule Take 28 mg by mouth at bedtime.    metFORMIN (GLUCOPHAGE) 500 MG tablet Take 500 mg by mouth 2 (two) times daily with a meal.    omega-3 acid ethyl esters (LOVAZA) 1 g capsule Take 1 g by mouth 2 (two) times daily.    pioglitazone (ACTOS) 45 MG tablet Take  45 mg by mouth daily.    sertraline (ZOLOFT) 100 MG tablet Take  100 mg by mouth daily.      STOP taking these medications     omeprazole (PRILOSEC) 20 MG capsule      simvastatin (ZOCOR) 40 MG tablet          Aspirin prescribed at discharge?  Yes High Intensity Statin Prescribed? (Lipitor 40-80mg  or Crestor 20-40mg ): Yes Beta Blocker Prescribed? Yes For EF <40%, was ACEI/ARB Prescribed? Yes ADP Receptor Inhibitor Prescribed? (i.e. Plavix etc.-Includes Medically Managed Patients): Yes For EF <40%, Aldosterone Inhibitor Prescribed? No: EF ok Was EF assessed during THIS hospitalization? Yes Was Cardiac Rehab II ordered? (Included Medically managed Patients): Yes   Outstanding Labs/Studies   FLP/LFTs in 6 week if tolerating statin change.   Duration of Discharge Encounter   Greater than 30 minutes including physician time.  Signed, Reino Bellis NP-C 04/07/2017, 8:51 AM

## 2017-04-07 NOTE — Progress Notes (Signed)
CARDIAC REHAB PHASE I   PRE:  Rate/Rhythm: 73 SR  BP:  Supine: 107/69  Sitting:   Standing:    SaO2: 95%RA  MODE:  Ambulation: 500 ft   POST:  Rate/Rhythm: 92 SR  BP:  Supine:   Sitting: 145/77  Standing:    SaO2: 96%RA 0900-1000 Pt walked 500 ft with hand held asst with steady gait. No CP. Tolerated well. MI education completed with pt who voiced understanding. Will need some re enforcement from family. Turned down pages in MI booklet that we discussed, and ex ed and diets are written down so family can review.. Stressed importance of plavix with stent. Pt stated her daughter puts her meds in container for her to take. Discussed CRP 2 and will refer to [facility] program. Reviewed NTG use, MI restrictions, healthy food choices and walking instructions.    Graylon Good, RN BSN  04/07/2017 9:57 AM

## 2017-04-08 NOTE — Consult Note (Signed)
           [facility] CM Primary Care Navigator  04/08/2017  TIEN AISPURO Jun 15, 1935 [id number]    Went to see patientat the bedsideto identify possible discharge needs but she was alreadydischarge per staffreport .  Patient was discharged home yesterday.  Primary care provider's office called (Kelly)to notify of patient's discharge and need for post hospital follow-up and transition of care. Notifiedof patient's health issues needing follow-up as well.  Made aware to refer patient to [facility] care management ifdeemed appropriatefor services.    For questions, please contact:  Dannielle Huh, BSN, RN- [facility] Primary Care Navigator  Telephone: [phone] [place]

## 2017-04-09 DIAGNOSIS — R079 Chest pain, unspecified: Secondary | ICD-10-CM | POA: Diagnosis not present

## 2017-04-09 NOTE — Telephone Encounter (Signed)
Patient contacted regarding discharge from [facility] on 04/07/2017.  Patient understands to follow up with provider L. Kilroy on 04/27/2017 at 0830 at [facility] office. Patient understands discharge instructions? Yes-daughter is helping Patient understands medications and regiment? Yes-daughter fills pill box Patient understands to bring all medications to this visit? Yes-asked Pt to write herself a note to bring all medications with her to her next appt  Pt states she is doing really good.  Pt states her friends came to pick her up today and took her out to eat and did a little shopping.  Pt states she has had no recurrent chest pain.  Per Pt, her daughter manages her medications and will be taking her to her follow up appt.  Per Pt, she can't drive again until this Sunday and she wouldn't drive in [place] anyway (states she "only drives in the country").   Pt repeated herself a few times, she has a history of dementia.  Pt continues to live alone at this time.  Gave my name and # to Pt in case her daughter has any questions.

## 2017-04-10 ENCOUNTER — Encounter (HOSPITAL_COMMUNITY): Payer: Self-pay | Admitting: Emergency Medicine

## 2017-04-10 ENCOUNTER — Emergency Department (HOSPITAL_COMMUNITY)
Admission: EM | Admit: 2017-04-10 | Discharge: 2017-04-10 | Disposition: A | Discharge status: Home / Self Care | Payer: Medicare HMO | Attending: Emergency Medicine | Admitting: Emergency Medicine

## 2017-04-10 ENCOUNTER — Emergency Department (HOSPITAL_COMMUNITY): Payer: Medicare HMO

## 2017-04-10 DIAGNOSIS — R0789 Other chest pain: Secondary | ICD-10-CM

## 2017-04-10 DIAGNOSIS — I2510 Atherosclerotic heart disease of native coronary artery without angina pectoris: Secondary | ICD-10-CM | POA: Diagnosis not present

## 2017-04-10 DIAGNOSIS — E119 Type 2 diabetes mellitus without complications: Secondary | ICD-10-CM | POA: Diagnosis not present

## 2017-04-10 DIAGNOSIS — Z8673 Personal history of transient ischemic attack (TIA), and cerebral infarction without residual deficits: Secondary | ICD-10-CM | POA: Insufficient documentation

## 2017-04-10 DIAGNOSIS — Z7902 Long term (current) use of antithrombotics/antiplatelets: Secondary | ICD-10-CM | POA: Insufficient documentation

## 2017-04-10 DIAGNOSIS — F0390 Unspecified dementia without behavioral disturbance: Secondary | ICD-10-CM | POA: Diagnosis not present

## 2017-04-10 DIAGNOSIS — Z79899 Other long term (current) drug therapy: Secondary | ICD-10-CM | POA: Insufficient documentation

## 2017-04-10 DIAGNOSIS — Z87891 Personal history of nicotine dependence: Secondary | ICD-10-CM | POA: Diagnosis not present

## 2017-04-10 DIAGNOSIS — I10 Essential (primary) hypertension: Secondary | ICD-10-CM | POA: Insufficient documentation

## 2017-04-10 DIAGNOSIS — Z7984 Long term (current) use of oral hypoglycemic drugs: Secondary | ICD-10-CM | POA: Diagnosis not present

## 2017-04-10 DIAGNOSIS — F329 Major depressive disorder, single episode, unspecified: Secondary | ICD-10-CM | POA: Diagnosis not present

## 2017-04-10 DIAGNOSIS — Z7982 Long term (current) use of aspirin: Secondary | ICD-10-CM | POA: Insufficient documentation

## 2017-04-10 DIAGNOSIS — R079 Chest pain, unspecified: Secondary | ICD-10-CM | POA: Diagnosis not present

## 2017-04-10 DIAGNOSIS — F419 Anxiety disorder, unspecified: Secondary | ICD-10-CM | POA: Insufficient documentation

## 2017-04-10 LAB — I-STAT TROPONIN, ED
TROPONIN I, POC: 0.72 ng/mL — AB (ref 0.00–0.08)
TROPONIN I, POC: 0.86 ng/mL — AB (ref 0.00–0.08)

## 2017-04-10 LAB — CBC
HCT: 29.6 % — ABNORMAL LOW (ref 36.0–46.0)
HEMOGLOBIN: 9.7 g/dL — AB (ref 12.0–15.0)
MCH: 29.3 pg (ref 26.0–34.0)
MCHC: 32.8 g/dL (ref 30.0–36.0)
MCV: 89.4 fL (ref 78.0–100.0)
Platelets: 171 10*3/uL (ref 150–400)
RBC: 3.31 MIL/uL — AB (ref 3.87–5.11)
RDW: 13.7 % (ref 11.5–15.5)
WBC: 7.7 10*3/uL (ref 4.0–10.5)

## 2017-04-10 LAB — BASIC METABOLIC PANEL
ANION GAP: 8 (ref 5–15)
BUN: 11 mg/dL (ref 6–20)
CALCIUM: 8.9 mg/dL (ref 8.9–10.3)
CO2: 21 mmol/L — ABNORMAL LOW (ref 22–32)
Chloride: 105 mmol/L (ref 101–111)
Creatinine, Ser: 1.53 mg/dL — ABNORMAL HIGH (ref 0.44–1.00)
GFR, EST AFRICAN AMERICAN: 36 mL/min — AB (ref >60)
GFR, EST NON AFRICAN AMERICAN: 31 mL/min — AB (ref >60)
Glucose, Bld: 195 mg/dL — ABNORMAL HIGH (ref 65–99)
POTASSIUM: 3.7 mmol/L (ref 3.5–5.1)
Sodium: 134 mmol/L — ABNORMAL LOW (ref 135–145)

## 2017-04-10 MED ORDER — NITROGLYCERIN 2 % TD OINT
1.0000 [in_us] | TOPICAL_OINTMENT | Freq: Once | TRANSDERMAL | Status: AC
  Administered 2017-04-10: 1 [in_us] via TOPICAL
  Filled 2017-04-10: qty 1

## 2017-04-10 MED ORDER — GI COCKTAIL ~~LOC~~
30.0000 mL | Freq: Once | ORAL | Status: AC
  Administered 2017-04-10: 30 mL via ORAL
  Filled 2017-04-10: qty 30

## 2017-04-10 NOTE — ED Triage Notes (Signed)
Pt was sitting in bed when she experienced sudden onset of "burning" in her centeral chest 1-2pain.  She has a stint placed on Monday as a result of a NSTEMI.  Given 324 ASA but refused nitro due to not wanting a headache.  CBG 215.

## 2017-04-10 NOTE — Discharge Instructions (Signed)
You were seen today for chest pain. Your heart tests are down trending. Your pain was more typical of reflux type pain. However, given your recent cardiac catheterization and heart disease, you should be reevaluated very closely by her cardiologist. Call for next available appointment. If you have any new or worsening symptoms she should be reevaluated immediately.

## 2017-04-10 NOTE — ED Provider Notes (Signed)
[facility] DEPT Provider Note   CSN: [id number] Arrival date & time: 04/10/17  0001  By signing my name below, I, Shawna Lynch, attest that this documentation has been prepared under the direction and in the presence of [name], Barbette Hair, MD. Electronically Signed: Marcello Lynch, ED Scribe. 04/10/17. 3:26 AM.  History   Chief Complaint Chief Complaint  Patient presents with  . Chest Pain   The history is provided by the patient. No language interpreter was used.   HPI Comments: Shawna Lynch is a 82 y.o. female, with a PMHx of MI, BIB EMS, who presents to the Emergency Department complaining of 5/10 chest pain that began 2 hours ago. She describes the pain is burning. She thought this pain was related to reflux which is usually relieved with belching. However, it has not been relieved. She was given 4 baby aspirin, per EMS with inadequate relief. The pt had NSTEMI 6 days ago, and was discharged 3 days ago. At this time she had an angioplasty with stent placement. Additionally, she also has a PMHx of DM, HLD, HTN, and DM. Her current chest pains are dissimilar to her previous symptoms.  The pt denies fatigue and SOB.  Past Medical History:  Diagnosis Date  . Anxiety   . Arthritis    oa  . Coronary artery disease    04/06/17 PCI with DES to [facility], 90% in diag from LAD, nonobstructive disease in LAD, RCA. Normal EF.   [name] Dementia   . Depression   . Diabetes mellitus without complication ([place])   . Hypertension   . NSTEMI (non-ST elevated myocardial infarction) ([place])   . TIA (transient ischemic attack)     Patient Active Problem List   Diagnosis Date Noted  . Hyperlipidemia 04/07/2017  . DM (diabetes mellitus) with complications ([place]) 62/70/3500  . Essential hypertension 04/06/2017  . NSTEMI (non-ST elevated myocardial infarction) ([place]) 04/03/2017    Past Surgical History:  Procedure Laterality Date  . ABDOMINAL HYSTERECTOMY    . CORONARY STENT INTERVENTION  04/06/2017   . CORONARY STENT INTERVENTION N/A 04/06/2017   Procedure: Coronary Stent Intervention;  Surgeon: Sherren Mocha, MD;  Location: [place] CV LAB;  Service: Cardiovascular;  Laterality: N/A;  . LEFT HEART CATH AND CORONARY ANGIOGRAPHY N/A 04/06/2017   Procedure: Left Heart Cath and Coronary Angiography;  Surgeon: Sherren Mocha, MD;  Location: [place] CV LAB;  Service: Cardiovascular;  Laterality: N/A;    OB History    No data available       Home Medications    Prior to Admission medications   Medication Sig Start Date End Date Taking? Authorizing Provider  aspirin EC 81 MG EC tablet Take 1 tablet (81 mg total) by mouth daily. 04/07/17  Yes [name], NP  atorvastatin (LIPITOR) 80 MG tablet Take 1 tablet (80 mg total) by mouth daily at 6 PM. 04/07/17  Yes [name], NP  carvedilol (COREG) 25 MG tablet Take 12.5 mg by mouth 2 (two) times daily with a meal.   Yes [provider]  clopidogrel (PLAVIX) 75 MG tablet Take 1 tablet (75 mg total) by mouth daily with breakfast. 04/08/17  Yes [name], NP  donepezil (ARICEPT) 10 MG tablet Take 10 mg by mouth at bedtime.   Yes [provider]  lisinopril (PRINIVIL,ZESTRIL) 40 MG tablet Take 0.5 tablets (20 mg total) by mouth daily. 04/07/17  Yes [name], NP  memantine (NAMENDA XR) 28 MG CP24 24 hr capsule  Take 28 mg by mouth at bedtime.   Yes [provider]  metFORMIN (GLUCOPHAGE) 500 MG tablet Take 500 mg by mouth 2 (two) times daily with a meal.   Yes [provider]  nitroGLYCERIN (NITROSTAT) 0.4 MG SL tablet Place 1 tablet (0.4 mg total) under the tongue every 5 (five) minutes x 3 doses as needed for chest pain. 04/07/17  Yes [name], NP  omega-3 acid ethyl esters (LOVAZA) 1 g capsule Take 1 g by mouth 2 (two) times daily.   Yes [provider]  pantoprazole (PROTONIX) 40 MG tablet Take 1 tablet (40 mg total) by mouth daily. 04/07/17  Yes [name], NP  pioglitazone (ACTOS) 45 MG tablet Take 45 mg by mouth daily.   Yes [provider]  sertraline (ZOLOFT) 100 MG tablet Take 100 mg by mouth daily.   Yes [provider]    Family History Family History  Problem Relation Age of Onset  . CAD Father   . Cancer Sister     Social History Social History  Substance Use Topics  . Smoking status: Former Research scientist (life sciences)  . Smokeless tobacco: Never Used     Comment: quit "20 years ago"  . Alcohol use No     Allergies   Codeine   Review of Systems Review of Systems  Constitutional: Negative for fatigue.  Respiratory: Positive for chest tightness. Negative for shortness of breath.   Cardiovascular: Positive for chest pain.  Gastrointestinal: Negative for abdominal pain, nausea and vomiting.  All other systems reviewed and are negative.    Physical Exam Updated Vital Signs BP 111/66   Pulse 61   Temp 98.2 F (36.8 C) (Oral)   Resp 14   Ht 5\' 6"  (1.676 m)   Wt 63.5 kg (140 lb)   SpO2 98%   BMI 22.60 kg/m   Physical Exam  Constitutional: She is oriented to person, place, and time. She appears well-developed and well-nourished.  Elderly, no acute distress  HENT:  Head: Normocephalic and atraumatic.  Cardiovascular: Normal rate, regular rhythm and normal heart sounds.   No murmur heard. Pulmonary/Chest: Effort normal and breath sounds normal. No respiratory distress. She has no wheezes.  Abdominal: Soft. Bowel sounds are normal. There is no tenderness. There is no guarding.  Neurological: She is alert and oriented to person, place, and time.  Skin: Skin is warm and dry.  Psychiatric: She has a normal mood and affect.  Nursing note and vitals reviewed.    ED Treatments / Results   DIAGNOSTIC STUDIES: Oxygen Saturation is 97% on RA, normal by my interpretation.   COORDINATION OF CARE: 3:26 AM-Discussed next steps with pt. Pt verbalized understanding and is agreeable with the plan.    Labs (all labs ordered are listed, but only abnormal results are displayed) Labs Reviewed  BASIC METABOLIC PANEL - Abnormal; Notable for the following:       Result Value   Sodium 134 (*)    CO2 21 (*)    Glucose, Bld 195 (*)    Creatinine, Ser 1.53 (*)    GFR calc non Af Amer 31 (*)    GFR calc Af Amer 36 (*)    All other components within normal limits  CBC - Abnormal; Notable for the following:    RBC 3.31 (*)    Hemoglobin 9.7 (*)    HCT 29.6 (*)    All other components within normal limits  I-STAT TROPOININ, ED - Abnormal; Notable  for the following:    Troponin i, poc 0.86 (*)    All other components within normal limits  I-STAT TROPOININ, ED - Abnormal; Notable for the following:    Troponin i, poc 0.72 (*)    All other components within normal limits    EKG  EKG Interpretation  Date/Time:  Friday April 10 2017 00:03:04 EDT Ventricular Rate:  69 PR Interval:    QRS Duration: 88 QT Interval:  412 QTC Calculation: 439 R Axis:   11 Text Interpretation:  Sinus rhythm Confirmed by Thayer Jew [id number]) on 04/10/2017 12:14:04 AM       Radiology Dg Chest 2 View  Result Date: 04/10/2017 CLINICAL DATA:  Central chest pain EXAM: CHEST  2 VIEW COMPARISON:  04/03/2017 FINDINGS: Low lung volumes. Patchy atelectasis at the left base. No pleural effusion. Heart size upper normal. Aortic atherosclerosis. No pneumothorax. IMPRESSION: Low lung volumes with patchy atelectasis at the left base. Electronically Signed   By: Donavan Foil M.D.   On: 04/10/2017 00:40    Procedures Procedures (including critical care time)  Medications Ordered in ED Medications  nitroGLYCERIN (NITROGLYN) 2 % ointment 1 inch (1 inch Topical Given 04/10/17 0046)  gi cocktail (Maalox,Lidocaine,Donnatal) (30 mLs Oral Given 04/10/17 0147)     Initial Impression / Assessment and Plan / ED Course  I have reviewed the triage vital signs and the nursing notes.  Pertinent labs & imaging results that  were available during my care of the patient were reviewed by me and considered in my medical decision making (see chart for details).  Patient is generally well-appearing. Presents with chest pain. Vital signs reassuring. Recent cardiac catheterization with stent placed in the circumflex. She also had a fairly stenosed diagonal off the LAD which was amenable only to medical management. She is nontoxic. Pain is atypical. It is not relieved with nitroglycerin. Patient was given a GI cocktail. Troponin is 0.86. Last troponin the hospital was greater than 4. This is likely a downtrending troponin. I discussed the patient with Dr. Irish Lack who reviewed the patient's cath results. He felt that she would likely not have anginal symptoms even if the stenosed diagonal was fully occluded. She reports compliance with her medications. She also has improved with a GI cocktail. If troponin remains downtrending with a repeat troponin, he feels that she can be discharged home. On repeat exam she is now chest pain-free following a GI cocktail. Repeat troponin is 0.72. Discussed the results with the patient and her family. Follow-up closely with cardiology for repeat evaluation.  After history, exam, and medical workup I feel the patient has been appropriately medically screened and is safe for discharge home. Pertinent diagnoses were discussed with the patient. Patient was given return precautions.   Final Clinical Impressions(s) / ED Diagnoses   Final diagnoses:  Atypical chest pain    New Prescriptions New Prescriptions   No medications on file   I personally performed the services described in this documentation, which was scribed in my presence. The recorded information has been reviewed and is accurate.     Merryl Hacker, MD 04/10/17 [phone]

## 2017-04-10 NOTE — ED Notes (Signed)
PA student bedside

## 2017-04-16 DIAGNOSIS — E7800 Pure hypercholesterolemia, unspecified: Secondary | ICD-10-CM | POA: Diagnosis not present

## 2017-04-16 DIAGNOSIS — I10 Essential (primary) hypertension: Secondary | ICD-10-CM | POA: Diagnosis not present

## 2017-04-16 DIAGNOSIS — D649 Anemia, unspecified: Secondary | ICD-10-CM | POA: Diagnosis not present

## 2017-04-16 DIAGNOSIS — E119 Type 2 diabetes mellitus without complications: Secondary | ICD-10-CM | POA: Diagnosis not present

## 2017-04-16 DIAGNOSIS — N183 Chronic kidney disease, stage 3 (moderate): Secondary | ICD-10-CM | POA: Diagnosis not present

## 2017-04-16 DIAGNOSIS — I2510 Atherosclerotic heart disease of native coronary artery without angina pectoris: Secondary | ICD-10-CM | POA: Diagnosis not present

## 2017-04-27 ENCOUNTER — Encounter: Payer: Self-pay | Admitting: Cardiology

## 2017-04-27 ENCOUNTER — Ambulatory Visit (INDEPENDENT_AMBULATORY_CARE_PROVIDER_SITE_OTHER): Payer: Medicare HMO | Admitting: Cardiology

## 2017-04-27 VITALS — BP 110/70 | HR 72 | Ht 66.0 in | Wt 122.2 lb

## 2017-04-27 DIAGNOSIS — F0390 Unspecified dementia without behavioral disturbance: Secondary | ICD-10-CM | POA: Insufficient documentation

## 2017-04-27 DIAGNOSIS — F0150 Vascular dementia without behavioral disturbance: Secondary | ICD-10-CM

## 2017-04-27 DIAGNOSIS — I214 Non-ST elevation (NSTEMI) myocardial infarction: Secondary | ICD-10-CM | POA: Diagnosis not present

## 2017-04-27 DIAGNOSIS — I2510 Atherosclerotic heart disease of native coronary artery without angina pectoris: Secondary | ICD-10-CM | POA: Diagnosis not present

## 2017-04-27 DIAGNOSIS — Z9861 Coronary angioplasty status: Secondary | ICD-10-CM

## 2017-04-27 DIAGNOSIS — E785 Hyperlipidemia, unspecified: Secondary | ICD-10-CM | POA: Diagnosis not present

## 2017-04-27 DIAGNOSIS — E118 Type 2 diabetes mellitus with unspecified complications: Secondary | ICD-10-CM | POA: Diagnosis not present

## 2017-04-27 DIAGNOSIS — I10 Essential (primary) hypertension: Secondary | ICD-10-CM | POA: Diagnosis not present

## 2017-04-27 MED ORDER — OMEGA-3-ACID ETHYL ESTERS 1 G PO CAPS: 1.0000 g | ORAL_CAPSULE | Freq: Two times a day (BID) | ORAL | 3 refills | Status: DC

## 2017-04-27 MED ORDER — ISOSORBIDE MONONITRATE ER 30 MG PO TB24: 30.0000 mg | ORAL_TABLET | Freq: Every day | ORAL | 3 refills | Status: DC

## 2017-04-27 NOTE — Patient Instructions (Signed)
Medication Instructions:  START isosorbide (Imdur) 30mg  (1 tablet) daily.  -start by taking 1/2 tablet for 3 days, then increase to 1 tablet daily.  Labwork: NONE  Testing/Procedures: NONE  Follow-Up: Your physician recommends that you schedule a follow-up appointment in: 2 weeks with Kerin Ransom PA (on a day that Dr. Percival Spanish is in the office.)   Any Other Special Instructions Will Be Listed Below (If Applicable).     If you need a refill on your cardiac medications before your next appointment, please call your pharmacy.

## 2017-04-27 NOTE — Assessment & Plan Note (Signed)
Controlled.  

## 2017-04-27 NOTE — Progress Notes (Signed)
04/27/2017 Shawna Lynch   18-Aug-1935  [id number]  Primary Physician Cyndi Bender, PA-C Primary Cardiologist: Dr Percival Spanish  HPI:  Pleasant 82 y/o female seen in the office today accompanied by her daughter. The pt has dementia and is unable to give any details of her symptoms, her daughter provided the history. The pt was admitted 04/03/17 with chest pain and ruled in for a NSTEMI- Troponin 6. She underwent cath 04/06/17 which revealed high grade mCFX disease and 95% proximal small Dx1. Her LVF was normal. She underwent PCI with DES to the CFX. The plan was for medical Rx of her Dx.   She has had some recurrent chest pain. She was seen the ED 04/10/17 with recurrent chest pain. It was felt to be GI. Her Troponin was falling. The daughter tells me her mothers symptoms are "tightness" and "heaviness". Its not exertional and can come on anytime. She has not tried NTG at home for this.    Current Outpatient Prescriptions  Medication Sig Dispense Refill  . aspirin EC 81 MG EC tablet Take 1 tablet (81 mg total) by mouth daily. 30 tablet   . atorvastatin (LIPITOR) 80 MG tablet Take 1 tablet (80 mg total) by mouth daily at 6 PM. 90 tablet 1  . carvedilol (COREG) 25 MG tablet Take 12.5 mg by mouth 2 (two) times daily with a meal.    . clopidogrel (PLAVIX) 75 MG tablet Take 1 tablet (75 mg total) by mouth daily with breakfast. 90 tablet 3  . donepezil (ARICEPT) 10 MG tablet Take 10 mg by mouth at bedtime.    [name] lisinopril (PRINIVIL,ZESTRIL) 40 MG tablet Take 0.5 tablets (20 mg total) by mouth daily. 90 tablet 0  . memantine (NAMENDA XR) 28 MG CP24 24 hr capsule Take 28 mg by mouth at bedtime.    . metFORMIN (GLUCOPHAGE) 500 MG tablet Take 500 mg by mouth 2 (two) times daily with a meal.    . nitroGLYCERIN (NITROSTAT) 0.4 MG SL tablet Place 1 tablet (0.4 mg total) under the tongue every 5 (five) minutes x 3 doses as needed for chest pain. 25 tablet 2  . omega-3 acid ethyl esters (LOVAZA) 1 g capsule Take 1  capsule (1 g total) by mouth 2 (two) times daily. 180 capsule 3  . pantoprazole (PROTONIX) 40 MG tablet Take 1 tablet (40 mg total) by mouth daily. 90 tablet 0  . pioglitazone (ACTOS) 45 MG tablet Take 45 mg by mouth daily.    . sertraline (ZOLOFT) 100 MG tablet Take 100 mg by mouth daily.    . isosorbide mononitrate (IMDUR) 30 MG 24 hr tablet Take 1 tablet (30 mg total) by mouth daily. 30 tablet 3   No current facility-administered medications for this visit.     Allergies  Allergen Reactions  . Codeine Rash    Past Medical History:  Diagnosis Date  . Anxiety   . Arthritis    oa  . Coronary artery disease    04/06/17 PCI with DES to [facility], 90% in diag from LAD, nonobstructive disease in LAD, RCA. Normal EF.   [name] Dementia   . Depression   . Diabetes mellitus without complication ([place])   . Hypertension   . NSTEMI (non-ST elevated myocardial infarction) ([place])   . TIA (transient ischemic attack)     Social History   Social History  . Marital status: Married    Spouse name: N/A  . Number of children: N/A  . Years of education:  N/A   Occupational History  . Not on file.   Social History Main Topics  . Smoking status: Former Research scientist (life sciences)  . Smokeless tobacco: Never Used     Comment: quit "20 years ago"  . Alcohol use No  . Drug use: No  . Sexual activity: Not on file   Other Topics Concern  . Not on file   Social History Narrative  . No narrative on file     Family History  Problem Relation Age of Onset  . CAD Father   . Cancer Sister      Review of Systems: General: negative for chills, fever, night sweats or weight changes.  Cardiovascular: negative for chest pain, dyspnea on exertion, edema, orthopnea, palpitations, paroxysmal nocturnal dyspnea or shortness of breath Dermatological: negative for rash Respiratory: negative for cough or wheezing Urologic: negative for hematuria Abdominal: negative for nausea, vomiting, diarrhea, bright red blood per rectum,  melena, or hematemesis Neurologic: negative for visual changes, syncope, or dizziness All other systems reviewed and are otherwise negative except as noted above.    Blood pressure 110/70, pulse 72, height 5\' 6"  (1.676 m), weight 122 lb 3.2 oz (55.4 kg).  General appearance: alert, cooperative and no distress Neck: no carotid bruit and no JVD Lungs: clear to auscultation bilaterally Heart: regular rate and rhythm Extremities: extremities normal, atraumatic, no cyanosis or edema Skin: Skin color, texture, turgor normal. No rashes or lesions Neurologic: Grossly normal  EKG NSR, no acute changes  ASSESSMENT AND PLAN:   CAD S/P percutaneous coronary angioplasty mCFX PCI/DES with residual 95% Dx1-(med Rx) 04/06/17 She came back to the ED 04/10/17 with chest pain-not felt to be cardiac  NSTEMI (non-ST elevated myocardial infarction) (HCC) Troponin peak 6  DM (diabetes mellitus) with complications (HCC) NIDDM type 2 DM  Dementia The pt's daughter gives the history  Dyslipidemia On high dose statin  Essential hypertension Controlled  Anemia Pt's Hgb went from 11.3-9.7 during her hospitalization. Her FOB was negative according to the [place]. The pt's daughter tells me this was re checked by her PCP and is back to her normal.   .  PLAN  Ms Pall's symptoms may in fact be angina. Add Imdur 30 mg. RTC in twow weeks to re access. Dr Irish Lack did speak to the ED MD and reviewed the pt's cath films when she went to the ED 04/10/17.   Kerin Ransom PA-C 04/27/2017 9:08 AM

## 2017-04-27 NOTE — Assessment & Plan Note (Addendum)
Troponin peak 6

## 2017-04-27 NOTE — Assessment & Plan Note (Signed)
On high dose statin.

## 2017-04-27 NOTE — Assessment & Plan Note (Signed)
NIDDM type 2 DM

## 2017-04-27 NOTE — Assessment & Plan Note (Signed)
The pt's daughter gives the history

## 2017-04-27 NOTE — Assessment & Plan Note (Addendum)
mCFX PCI/DES with residual 95% Dx1-(med Rx) 04/06/17 She came back to the ED 04/10/17 with chest pain-not felt to be cardiac

## 2017-05-08 DIAGNOSIS — Z23 Encounter for immunization: Secondary | ICD-10-CM | POA: Diagnosis not present

## 2017-05-08 DIAGNOSIS — E785 Hyperlipidemia, unspecified: Secondary | ICD-10-CM | POA: Diagnosis not present

## 2017-05-08 DIAGNOSIS — Z136 Encounter for screening for cardiovascular disorders: Secondary | ICD-10-CM | POA: Diagnosis not present

## 2017-05-08 DIAGNOSIS — Z0000 Encounter for general adult medical examination without abnormal findings: Secondary | ICD-10-CM | POA: Diagnosis not present

## 2017-05-08 DIAGNOSIS — Z1389 Encounter for screening for other disorder: Secondary | ICD-10-CM | POA: Diagnosis not present

## 2017-05-08 DIAGNOSIS — Z9181 History of falling: Secondary | ICD-10-CM | POA: Diagnosis not present

## 2017-05-12 ENCOUNTER — Encounter: Payer: Self-pay | Admitting: Physician Assistant

## 2017-05-12 ENCOUNTER — Ambulatory Visit (INDEPENDENT_AMBULATORY_CARE_PROVIDER_SITE_OTHER): Payer: Medicare HMO | Admitting: Physician Assistant

## 2017-05-12 VITALS — BP 149/85 | HR 83 | Ht 66.0 in | Wt 146.0 lb

## 2017-05-12 DIAGNOSIS — E119 Type 2 diabetes mellitus without complications: Secondary | ICD-10-CM | POA: Diagnosis not present

## 2017-05-12 DIAGNOSIS — E785 Hyperlipidemia, unspecified: Secondary | ICD-10-CM

## 2017-05-12 DIAGNOSIS — I2510 Atherosclerotic heart disease of native coronary artery without angina pectoris: Secondary | ICD-10-CM | POA: Diagnosis not present

## 2017-05-12 DIAGNOSIS — I10 Essential (primary) hypertension: Secondary | ICD-10-CM | POA: Diagnosis not present

## 2017-05-12 NOTE — Patient Instructions (Signed)
Medication Instructions: Your physician recommends that you continue on your current medications as directed. Please refer to the Current Medication list given to you today.  Labwork: Your physician recommends that you return for a FASTING lipid profile and hepatic function panel in September and your Primary Care Physician's office.  Follow-Up: Your physician recommends that you schedule a follow-up appointment in: 2-3 months with Dr. Percival Spanish.  If you need a refill on your cardiac medications before your next appointment, please call your pharmacy.

## 2017-05-12 NOTE — Progress Notes (Signed)
Cardiology Office Note    Date:  05/13/2017   ID:  Shawna Lynch, DOB 30-Mar-1935, MRN [id number]  PCP:  Cyndi Bender, PA-C  Cardiologist:  Dr. Percival Spanish  Chief Complaint  Patient presents with  . Follow-up    2 weeks; Pt states no Sx.     History of Present Illness:  Shawna Lynch is a 82 y.o. female with PMH of CAD, HTN, TIA, DM II. Patient was admitted on 04/03/2017 with chest pain and ruled in for NSTEMI with troponin of 6. She underwent cardiac catheterization on 04/06/2017 which revealed high-grade mid left circumflex disease and 95% proximal small D1, EF was normal. She eventually underwent PCI with DES to left circumflex, diagonal lesion was treated medically. She returned to the ED on 04/10/2017 with recurrent chest pain which was felt to be GI in nature. Her troponin was falling. During the recent hospitalization, her hemoglobin also dropped from 11.3 down to 9.7, FOB was negative. This has been rechecked by her primary care provider and came back normal. She was last seen by Shawna Ransom PA-C on 04/27/2017, due to concern for possible angina, Imdur 30 mg was added. She will return today for repeat visit.  Patient presents today for cardiology office visit. Since admission on Imdur, she does not have any chest pain at all. She has been able to walk her dog at home without any exertional symptom. She still lived by herself and continued to drive at this point. Otherwise she denies any significant dizziness, lower extremity edema, orthopnea or paroxysmal nocturnal dyspnea. Her blood pressure is mildly elevated today at 149/85. After letting her rest for 10 minutes, I rechecked it myself, which came back even higher 158/85. Interestingly, her blood pressure was completely normal during the last office visit even prior to the addition of Imdur. I will hold off on adjusting her blood pressure medication based off on single day of abnormal reading. Otherwise she is on appropriate cardiology  medication including aspirin, carvedilol, Lipitor, Plavix, Imdur and lisinopril. She says her primary care physician has already checked her renal function which is stable. She can follow-up with Dr. Percival Spanish in 2-3 months.    Past Medical History:  Diagnosis Date  . Anxiety   . Arthritis    oa  . Coronary artery disease    04/06/17 PCI with DES to [facility], 90% in diag from LAD, nonobstructive disease in LAD, RCA. Normal EF.   [name] Dementia   . Depression   . Diabetes mellitus without complication ([place])   . Hypertension   . NSTEMI (non-ST elevated myocardial infarction) ([place])   . TIA (transient ischemic attack)     Past Surgical History:  Procedure Laterality Date  . ABDOMINAL HYSTERECTOMY    . CORONARY STENT INTERVENTION  04/06/2017  . CORONARY STENT INTERVENTION N/A 04/06/2017   Procedure: Coronary Stent Intervention;  Surgeon: Sherren Mocha, MD;  Location: [place] [facility];  Service: Cardiovascular;  Laterality: N/A;  . LEFT HEART CATH AND CORONARY ANGIOGRAPHY N/A 04/06/2017   Procedure: Left Heart Cath and Coronary Angiography;  Surgeon: Sherren Mocha, MD;  Location: [place] CV [facility];  Service: Cardiovascular;  Laterality: N/A;    Current Medications: Outpatient Medications Prior to Visit  Medication Sig Dispense Refill  . aspirin EC 81 MG EC tablet Take 1 tablet (81 mg total) by mouth daily. 30 tablet   . atorvastatin (LIPITOR) 80 MG tablet Take 1 tablet (80 mg total) by mouth daily at 6 PM. 90 tablet  1  . carvedilol (COREG) 25 MG tablet Take 12.5 mg by mouth 2 (two) times daily with a meal.    . clopidogrel (PLAVIX) 75 MG tablet Take 1 tablet (75 mg total) by mouth daily with breakfast. 90 tablet 3  . donepezil (ARICEPT) 10 MG tablet Take 10 mg by mouth at bedtime.    . isosorbide mononitrate (IMDUR) 30 MG 24 hr tablet Take 1 tablet (30 mg total) by mouth daily. 30 tablet 3  . lisinopril (PRINIVIL,ZESTRIL) 40 MG tablet Take 0.5 tablets (20 mg total) by mouth daily. 90 tablet 0   . memantine (NAMENDA XR) 28 MG CP24 24 hr capsule Take 28 mg by mouth at bedtime.    . metFORMIN (GLUCOPHAGE) 500 MG tablet Take 500 mg by mouth 2 (two) times daily with a meal.    . nitroGLYCERIN (NITROSTAT) 0.4 MG SL tablet Place 1 tablet (0.4 mg total) under the tongue every 5 (five) minutes x 3 doses as needed for chest pain. 25 tablet 2  . omega-3 acid ethyl esters (LOVAZA) 1 g capsule Take 1 capsule (1 g total) by mouth 2 (two) times daily. 180 capsule 3  . pantoprazole (PROTONIX) 40 MG tablet Take 1 tablet (40 mg total) by mouth daily. 90 tablet 0  . pioglitazone (ACTOS) 45 MG tablet Take 45 mg by mouth daily.    . sertraline (ZOLOFT) 100 MG tablet Take 100 mg by mouth daily.     No facility-administered medications prior to visit.      Allergies:   Codeine   Social History   Social History  . Marital status: Married    Spouse name: N/A  . Number of children: N/A  . Years of education: N/A   Social History Main Topics  . Smoking status: Former Research scientist (life sciences)  . Smokeless tobacco: Never Used     Comment: quit "20 years ago"  . Alcohol use No  . Drug use: No  . Sexual activity: Not Asked   Other Topics Concern  . None   Social History Narrative  . None     Family History:  The patient's family history includes CAD in her father; Cancer in her sister.   ROS:   Please see the history of present illness.    ROS All other systems reviewed and are negative.   PHYSICAL EXAM:   VS:  BP (!) 149/85   Pulse 83   Ht 5\' 6"  (1.676 m)   Wt 146 lb (66.2 kg)   BMI 23.57 kg/m    GEN: Well nourished, well developed, in no acute distress  HEENT: normal  Neck: no JVD, carotid bruits, or masses Cardiac: RRR; no murmurs, rubs, or gallops,no edema  Respiratory:  clear to auscultation bilaterally, normal work of breathing GI: soft, nontender, nondistended, + BS MS: no deformity or atrophy  Skin: warm and dry, no rash Neuro:  Alert and Oriented x 3, Strength and sensation are  intact Psych: euthymic mood, full affect  Wt Readings from Last 3 Encounters:  05/12/17 146 lb (66.2 kg)  04/27/17 122 lb 3.2 oz (55.4 kg)  04/10/17 140 lb (63.5 kg)      Studies/Labs Reviewed:   EKG:  EKG is not ordered today.    Recent Labs: 04/10/2017: BUN 11; Creatinine, Ser 1.53; Hemoglobin 9.7; Platelets 171; Potassium 3.7; Sodium 134   Lipid Panel No results found for: CHOL, TRIG, HDL, CHOLHDL, VLDL, LDLCALC, LDLDIRECT  Additional studies/ records that were reviewed today include:    Echo  04/04/2017 LV EF: 55% -   60%  Study Conclusions  - Left ventricle: The cavity size was normal. Wall thickness was   increased in a pattern of mild LVH. Systolic function was normal.   The estimated ejection fraction was in the range of 55% to 60%.   Wall motion was normal; there were no regional wall motion   abnormalities. - Aortic valve: Mildly calcified annulus. Trileaflet; normal   thickness leaflets. - Mitral valve: Mildly calcified annulus. - Pulmonary arteries: Systolic pressure was mildly increased. PA   peak pressure: 34 mm Hg (S).    Cath 04/06/2017 Conclusion   1. Severe stenosis of the mid left circumflex treated successfully with PCI using a small drug-eluting stent platform 2. Severe stenosis of the small diagonal branch of the LAD appropriate for medical therapy 3. Widely patent right coronary artery with mild nonobstructive plaquing 4. Known normal LV function by echo assessment  Recommendation: Dual antiplatelet therapy with aspirin and clopidogrel for 12 months. Medical therapy for further risk reduction.      ASSESSMENT:    1. Coronary artery disease involving native coronary artery of native heart without angina pectoris   2. Dyslipidemia   3. Essential hypertension   4. Controlled type 2 diabetes mellitus without complication, without long-term current use of insulin (HCC)      PLAN:  In order of problems listed above:  1. CAD: Recently  underwent PCI of left circumflex, medical therapy for small diagonal branch. Initially did have some chest discomfort after released from hospital, however after additional injury during the last office visit, her chest discomfort has resolved.  2. HTN: Continue on carvedilol, lisinopril and Imdur. Blood pressure mildly elevated today. However her blood pressure was normal during the last office visit. We'll hold off on adjusting blood pressure medication.  3. HLD: Continue Lipitor 80 mg daily, fasting lipid panel and LFTs in September  4. DM II: Currently on metformin and Actos    Medication Adjustments/Labs and Tests Ordered: Current medicines are reviewed at length with the patient today.  Concerns regarding medicines are outlined above.  Medication changes, Labs and Tests ordered today are listed in the Patient Instructions below. Patient Instructions  Medication Instructions: Your physician recommends that you continue on your current medications as directed. Please refer to the Current Medication list given to you today.  Labwork: Your physician recommends that you return for a FASTING lipid profile and hepatic function panel in September and your Primary Care Physician's office.  Follow-Up: Your physician recommends that you schedule a follow-up appointment in: 2-3 months with Dr. Percival Spanish.  If you need a refill on your cardiac medications before your next appointment, please call your pharmacy.     Hilbert Corrigan, [place]  05/13/2017 9:14 PM    [facility] [place], [place], [place]  [address] Phone: [phone]; Fax: [phone]

## 2017-05-13 ENCOUNTER — Encounter: Payer: Self-pay | Admitting: Physician Assistant

## 2017-06-10 DIAGNOSIS — E119 Type 2 diabetes mellitus without complications: Secondary | ICD-10-CM | POA: Diagnosis not present

## 2017-06-18 ENCOUNTER — Other Ambulatory Visit: Payer: Self-pay | Admitting: *Deleted

## 2017-06-18 ENCOUNTER — Other Ambulatory Visit: Payer: Self-pay

## 2017-06-18 MED ORDER — ISOSORBIDE MONONITRATE ER 30 MG PO TB24: 30.0000 mg | ORAL_TABLET | Freq: Every day | ORAL | 0 refills | Status: DC

## 2017-06-18 MED ORDER — ISOSORBIDE MONONITRATE ER 30 MG PO TB24: 30.0000 mg | ORAL_TABLET | Freq: Every day | ORAL | 1 refills | Status: DC

## 2017-06-24 DIAGNOSIS — E1165 Type 2 diabetes mellitus with hyperglycemia: Secondary | ICD-10-CM | POA: Diagnosis not present

## 2017-06-24 DIAGNOSIS — E7800 Pure hypercholesterolemia, unspecified: Secondary | ICD-10-CM | POA: Diagnosis not present

## 2017-06-24 DIAGNOSIS — N183 Chronic kidney disease, stage 3 (moderate): Secondary | ICD-10-CM | POA: Diagnosis not present

## 2017-06-25 DIAGNOSIS — Z23 Encounter for immunization: Secondary | ICD-10-CM | POA: Diagnosis not present

## 2017-06-25 DIAGNOSIS — R413 Other amnesia: Secondary | ICD-10-CM | POA: Diagnosis not present

## 2017-06-25 DIAGNOSIS — F419 Anxiety disorder, unspecified: Secondary | ICD-10-CM | POA: Diagnosis not present

## 2017-06-25 DIAGNOSIS — R51 Headache: Secondary | ICD-10-CM | POA: Diagnosis not present

## 2017-06-25 DIAGNOSIS — E1165 Type 2 diabetes mellitus with hyperglycemia: Secondary | ICD-10-CM | POA: Diagnosis not present

## 2017-06-25 DIAGNOSIS — Z139 Encounter for screening, unspecified: Secondary | ICD-10-CM | POA: Diagnosis not present

## 2017-06-25 DIAGNOSIS — I2510 Atherosclerotic heart disease of native coronary artery without angina pectoris: Secondary | ICD-10-CM | POA: Diagnosis not present

## 2017-06-25 DIAGNOSIS — N183 Chronic kidney disease, stage 3 (moderate): Secondary | ICD-10-CM | POA: Diagnosis not present

## 2017-07-30 DIAGNOSIS — R55 Syncope and collapse: Secondary | ICD-10-CM | POA: Diagnosis not present

## 2017-07-30 DIAGNOSIS — N183 Chronic kidney disease, stage 3 (moderate): Secondary | ICD-10-CM | POA: Diagnosis not present

## 2017-07-30 DIAGNOSIS — I2510 Atherosclerotic heart disease of native coronary artery without angina pectoris: Secondary | ICD-10-CM | POA: Diagnosis not present

## 2017-07-30 DIAGNOSIS — E1122 Type 2 diabetes mellitus with diabetic chronic kidney disease: Secondary | ICD-10-CM | POA: Diagnosis not present

## 2017-07-31 DIAGNOSIS — N183 Chronic kidney disease, stage 3 (moderate): Secondary | ICD-10-CM | POA: Diagnosis not present

## 2017-07-31 DIAGNOSIS — I959 Hypotension, unspecified: Secondary | ICD-10-CM | POA: Diagnosis not present

## 2017-07-31 DIAGNOSIS — N390 Urinary tract infection, site not specified: Secondary | ICD-10-CM | POA: Diagnosis not present

## 2017-07-31 DIAGNOSIS — R822 Biliuria: Secondary | ICD-10-CM | POA: Diagnosis not present

## 2017-07-31 DIAGNOSIS — Z6823 Body mass index (BMI) 23.0-23.9, adult: Secondary | ICD-10-CM | POA: Diagnosis not present

## 2017-07-31 DIAGNOSIS — E1122 Type 2 diabetes mellitus with diabetic chronic kidney disease: Secondary | ICD-10-CM | POA: Diagnosis not present

## 2017-07-31 DIAGNOSIS — R55 Syncope and collapse: Secondary | ICD-10-CM | POA: Diagnosis not present

## 2017-07-31 DIAGNOSIS — I2510 Atherosclerotic heart disease of native coronary artery without angina pectoris: Secondary | ICD-10-CM | POA: Diagnosis not present

## 2017-08-03 DIAGNOSIS — R531 Weakness: Secondary | ICD-10-CM | POA: Diagnosis not present

## 2017-08-03 DIAGNOSIS — I959 Hypotension, unspecified: Secondary | ICD-10-CM | POA: Diagnosis not present

## 2017-08-03 DIAGNOSIS — Z6824 Body mass index (BMI) 24.0-24.9, adult: Secondary | ICD-10-CM | POA: Diagnosis not present

## 2017-08-03 DIAGNOSIS — N183 Chronic kidney disease, stage 3 (moderate): Secondary | ICD-10-CM | POA: Diagnosis not present

## 2017-08-03 DIAGNOSIS — E1122 Type 2 diabetes mellitus with diabetic chronic kidney disease: Secondary | ICD-10-CM | POA: Diagnosis not present

## 2017-08-13 DIAGNOSIS — I10 Essential (primary) hypertension: Secondary | ICD-10-CM | POA: Diagnosis not present

## 2017-08-13 DIAGNOSIS — I959 Hypotension, unspecified: Secondary | ICD-10-CM | POA: Diagnosis not present

## 2017-08-13 DIAGNOSIS — Z6823 Body mass index (BMI) 23.0-23.9, adult: Secondary | ICD-10-CM | POA: Diagnosis not present

## 2017-08-13 DIAGNOSIS — E1122 Type 2 diabetes mellitus with diabetic chronic kidney disease: Secondary | ICD-10-CM | POA: Diagnosis not present

## 2017-08-17 ENCOUNTER — Telehealth: Payer: Self-pay | Admitting: Cardiology

## 2017-08-17 NOTE — Telephone Encounter (Signed)
Pt has appt 11/21 - I don't have DPR for the daughter. Attempted to reach pt at listed number, no answer when dialed.

## 2017-08-17 NOTE — Telephone Encounter (Signed)
New message   Daughter called about bp fluctuating  Pt c/o BP issue: STAT if pt c/o blurred vision, one-sided weakness or slurred speech  1. What are your last 5 BP readings?  Does not have them right now 78/56 2 weeks ago  116/68 this morning   2. Are you having any other symptoms (ex. Dizziness, headache, blurred vision, passed out)?  Ringing in ears   3. What is your BP issue?  bp goes up and down and has insistent ringing in ears

## 2017-08-18 ENCOUNTER — Emergency Department (HOSPITAL_COMMUNITY): Payer: Medicare HMO

## 2017-08-18 ENCOUNTER — Encounter (HOSPITAL_COMMUNITY): Payer: Self-pay | Admitting: Emergency Medicine

## 2017-08-18 ENCOUNTER — Emergency Department (HOSPITAL_COMMUNITY)
Admission: EM | Admit: 2017-08-18 | Discharge: 2017-08-18 | Disposition: A | Discharge status: Home / Self Care | Payer: Medicare HMO | Attending: Emergency Medicine | Admitting: Emergency Medicine

## 2017-08-18 DIAGNOSIS — R531 Weakness: Secondary | ICD-10-CM | POA: Diagnosis not present

## 2017-08-18 DIAGNOSIS — Z7982 Long term (current) use of aspirin: Secondary | ICD-10-CM | POA: Insufficient documentation

## 2017-08-18 DIAGNOSIS — R0789 Other chest pain: Secondary | ICD-10-CM | POA: Insufficient documentation

## 2017-08-18 DIAGNOSIS — E119 Type 2 diabetes mellitus without complications: Secondary | ICD-10-CM | POA: Insufficient documentation

## 2017-08-18 DIAGNOSIS — I10 Essential (primary) hypertension: Secondary | ICD-10-CM | POA: Insufficient documentation

## 2017-08-18 DIAGNOSIS — I2510 Atherosclerotic heart disease of native coronary artery without angina pectoris: Secondary | ICD-10-CM | POA: Insufficient documentation

## 2017-08-18 DIAGNOSIS — F0390 Unspecified dementia without behavioral disturbance: Secondary | ICD-10-CM | POA: Diagnosis not present

## 2017-08-18 DIAGNOSIS — I252 Old myocardial infarction: Secondary | ICD-10-CM | POA: Diagnosis not present

## 2017-08-18 DIAGNOSIS — Z79899 Other long term (current) drug therapy: Secondary | ICD-10-CM | POA: Diagnosis not present

## 2017-08-18 DIAGNOSIS — Z87891 Personal history of nicotine dependence: Secondary | ICD-10-CM | POA: Insufficient documentation

## 2017-08-18 DIAGNOSIS — H9313 Tinnitus, bilateral: Secondary | ICD-10-CM | POA: Insufficient documentation

## 2017-08-18 DIAGNOSIS — Z8673 Personal history of transient ischemic attack (TIA), and cerebral infarction without residual deficits: Secondary | ICD-10-CM | POA: Diagnosis not present

## 2017-08-18 DIAGNOSIS — Z7902 Long term (current) use of antithrombotics/antiplatelets: Secondary | ICD-10-CM | POA: Insufficient documentation

## 2017-08-18 DIAGNOSIS — F329 Major depressive disorder, single episode, unspecified: Secondary | ICD-10-CM | POA: Diagnosis not present

## 2017-08-18 DIAGNOSIS — H9319 Tinnitus, unspecified ear: Secondary | ICD-10-CM

## 2017-08-18 LAB — I-STAT TROPONIN, ED: TROPONIN I, POC: 0.01 ng/mL (ref 0.00–0.08)

## 2017-08-18 LAB — BASIC METABOLIC PANEL
Anion gap: 9 (ref 5–15)
BUN: 19 mg/dL (ref 6–20)
CHLORIDE: 106 mmol/L (ref 101–111)
CO2: 26 mmol/L (ref 22–32)
CREATININE: 1.59 mg/dL — AB (ref 0.44–1.00)
Calcium: 9.7 mg/dL (ref 8.9–10.3)
GFR, EST AFRICAN AMERICAN: 34 mL/min — AB (ref >60)
GFR, EST NON AFRICAN AMERICAN: 30 mL/min — AB (ref >60)
Glucose, Bld: 141 mg/dL — ABNORMAL HIGH (ref 65–99)
Potassium: 3.9 mmol/L (ref 3.5–5.1)
SODIUM: 141 mmol/L (ref 135–145)

## 2017-08-18 LAB — CBC
HCT: 34.2 % — ABNORMAL LOW (ref 36.0–46.0)
Hemoglobin: 11 g/dL — ABNORMAL LOW (ref 12.0–15.0)
MCH: 29.6 pg (ref 26.0–34.0)
MCHC: 32.2 g/dL (ref 30.0–36.0)
MCV: 91.9 fL (ref 78.0–100.0)
PLATELETS: 194 10*3/uL (ref 150–400)
RBC: 3.72 MIL/uL — AB (ref 3.87–5.11)
RDW: 12.5 % (ref 11.5–15.5)
WBC: 5.5 10*3/uL (ref 4.0–10.5)

## 2017-08-18 MED ORDER — SODIUM CHLORIDE 0.9 % IV BOLUS (SEPSIS)
1000.0000 mL | Freq: Once | INTRAVENOUS | Status: AC
  Administered 2017-08-18: 1000 mL via INTRAVENOUS

## 2017-08-18 NOTE — ED Provider Notes (Signed)
[place] EMERGENCY DEPARTMENT Provider Note   CSN: [id number] Arrival date & time: 08/18/17  0630     History   Chief Complaint Chief Complaint  Patient presents with  . Chest Pain    HPI Shawna Lynch is a 82 y.o. female.  HPI  Patient with history of dementia and NSTEMI presenting with multiple complaints.  Family is concerned because she has had changes in her blood pressure.  Over the past several weeks she has been seen by her primary doctor several times and had blood pressure medications adjusted.  She also complains of chest heaviness which has been going on for several weeks.  Family states she is not eating as well as usual but does continue to drink well.  She has no shortness of breath no nausea or vomiting.  She currently has no chest pain or active symptoms.  The family is also concerned that she has been depressed and more tearful.  She denies suicidal ideations at this time. No fever.  No cough.  No abdominal pain.  There are no other associated systemic symptoms, there are no other alleviating or modifying factors.   Past Medical History:  Diagnosis Date  . Anxiety   . Arthritis    oa  . Coronary artery disease    04/06/17 PCI with DES to [facility], 90% in diag from LAD, nonobstructive disease in LAD, RCA. Normal EF.   [name] Dementia   . Depression   . Diabetes mellitus without complication ([place])   . Hypertension   . NSTEMI (non-ST elevated myocardial infarction) ([place])   . TIA (transient ischemic attack)     Patient Active Problem List   Diagnosis Date Noted  . CAD S/P percutaneous coronary angioplasty 04/27/2017  . Dementia 04/27/2017  . Dyslipidemia 04/07/2017  . DM (diabetes mellitus) with complications ([place]) 16/09/930  . Essential hypertension 04/06/2017  . NSTEMI (non-ST elevated myocardial infarction) ([place]) 04/03/2017    Past Surgical History:  Procedure Laterality Date  . ABDOMINAL HYSTERECTOMY    . CORONARY STENT INTERVENTION   04/06/2017  . CORONARY STENT INTERVENTION N/A 04/06/2017   Procedure: Coronary Stent Intervention;  Surgeon: Sherren Mocha, MD;  Location: [place] CV LAB;  Service: Cardiovascular;  Laterality: N/A;  . LEFT HEART CATH AND CORONARY ANGIOGRAPHY N/A 04/06/2017   Procedure: Left Heart Cath and Coronary Angiography;  Surgeon: Sherren Mocha, MD;  Location: [place] CV LAB;  Service: Cardiovascular;  Laterality: N/A;    OB History    No data available       Home Medications    Prior to Admission medications   Medication Sig Start Date End Date Taking? Authorizing Provider  aspirin EC 81 MG EC tablet Take 1 tablet (81 mg total) by mouth daily. 04/07/17  Yes [name], NP  atorvastatin (LIPITOR) 80 MG tablet Take 1 tablet (80 mg total) by mouth daily at 6 PM. 04/07/17  Yes [name], NP  carvedilol (COREG) 25 MG tablet Take 12.5 mg by mouth 2 (two) times daily with a meal.   Yes [provider]  cholecalciferol (VITAMIN D) 1000 units tablet Take 1,000 Units by mouth daily.   Yes [provider]  clopidogrel (PLAVIX) 75 MG tablet Take 1 tablet (75 mg total) by mouth daily with breakfast. 04/08/17  Yes [name], NP  donepezil (ARICEPT) 10 MG tablet Take 10 mg by mouth at bedtime.   Yes [provider]  Dulaglutide (TRULICITY [place]) Inject 3.55 mg  into the skin once a week.   Yes [provider]  FISH OIL-BORAGE-FLAX-SAFFLOWER PO Take 1 capsule by mouth daily.   Yes [provider]  isosorbide mononitrate (IMDUR) 30 MG 24 hr tablet Take 1 tablet (30 mg total) by mouth daily. 06/18/17 09/16/17 Yes Kilroy, Luke K, PA-C  lisinopril (PRINIVIL,ZESTRIL) 40 MG tablet Take 0.5 tablets (20 mg total) by mouth daily. 04/07/17  Yes [name], NP  memantine (NAMENDA XR) 28 MG CP24 24 hr capsule Take 28 mg by mouth at bedtime.   Yes [provider]  pantoprazole (PROTONIX) 40 MG tablet Take 1 tablet (40 mg total) by mouth  daily. 04/07/17  Yes [name], NP  pioglitazone (ACTOS) 45 MG tablet Take 45 mg by mouth daily.   Yes [provider]  sertraline (ZOLOFT) 100 MG tablet Take 100 mg by mouth daily.   Yes [provider]  vitamin B-12 (CYANOCOBALAMIN) 1000 MCG tablet Take 1,000 mcg by mouth daily.   Yes [provider]  nitroGLYCERIN (NITROSTAT) 0.4 MG SL tablet Place 1 tablet (0.4 mg total) under the tongue every 5 (five) minutes x 3 doses as needed for chest pain. 04/07/17   [name], NP  omega-3 acid ethyl esters (LOVAZA) 1 g capsule Take 1 capsule (1 g total) by mouth 2 (two) times daily. Patient not taking: Reported on 08/18/2017 04/27/17   Erlene Quan, PA-C    Family History Family History  Problem Relation Age of Onset  . CAD Father   . Cancer Sister     Social History Social History   Tobacco Use  . Smoking status: Former Research scientist (life sciences)  . Smokeless tobacco: Never Used  . Tobacco comment: quit "20 years ago"  Substance Use Topics  . Alcohol use: No  . Drug use: No     Allergies   Penicillins; Sulfa antibiotics; and Codeine   Review of Systems Review of Systems  ROS reviewed and all otherwise negative except for mentioned in HPI   Physical Exam Updated Vital Signs BP 135/82   Pulse 73   Temp 98.1 F (36.7 C) (Oral)   Resp 13   SpO2 100%  Vitals reviewed Physical Exam  Physical Examination: General appearance - alert, well appearing, and in no distress Mental status - alert, oriented to person, place not to time Eyes - pupils equal and reactive, extraocular eye movements intact, no nystagmus Mouth - mucous membranes moist, pharynx normal without lesions Neck - supple, no significant adenopathy Chest - clear to auscultation, no wheezes, rales or rhonchi, symmetric air entry Heart - normal rate, regular rhythm, normal S1, S2, no murmurs, rubs, clicks or gallops Abdomen - soft, nontender, nondistended, no masses or  organomegaly Neurological - alert, oriented x 2, cranial nerves 2-12 tested and intact, strength 5/5 in extremities x 4, sensation intact, normal speech Extremities - peripheral pulses normal, no pedal edema, no clubbing or cyanosis Skin - normal coloration and turgor, no rashes Psych- calm, cooperative   ED Treatments / Results  Labs (all labs ordered are listed, but only abnormal results are displayed) Labs Reviewed  BASIC METABOLIC PANEL - Abnormal; Notable for the following components:      Result Value   Glucose, Bld 141 (*)    Creatinine, Ser 1.59 (*)    GFR calc non Af Amer 30 (*)    GFR calc Af Amer 34 (*)    All other components within normal limits  CBC - Abnormal; Notable for the  following components:   RBC 3.72 (*)    Hemoglobin 11.0 (*)    HCT 34.2 (*)    All other components within normal limits  I-STAT TROPONIN, ED  I-STAT TROPONIN, ED    EKG  EKG Interpretation  Date/Time:  Tuesday August 18 2017 08:47:20 EST Ventricular Rate:  78 PR Interval:  170 QRS Duration: 84 QT Interval:  370 QTC Calculation: 421 R Axis:   60 Text Interpretation:  Normal sinus rhythm Cannot rule out Anterior infarct , age undetermined Abnormal ECG No significant change since last tracing Confirmed by Alfonzo Beers [phone]) on 08/18/2017 1:27:15 PM       Radiology Dg Chest 2 View  Result Date: 08/18/2017 CLINICAL DATA:  Hypertension.  Weakness. EXAM: CHEST  2 VIEW COMPARISON:  April 10, 2017 FINDINGS: Lungs are clear. Heart size and pulmonary vascularity are normal. No adenopathy. There is aortic atherosclerosis. No evident adenopathy. There is calcification in the lateral right shoulder. IMPRESSION: No edema or consolidation. Aortic atherosclerosis. No evident adenopathy. Aortic Atherosclerosis (ICD10-I70.0). Electronically Signed   By: Lowella Grip III M.D.   On: 08/18/2017 09:43   Ct Head Wo Contrast  Result Date: 08/18/2017 CLINICAL DATA:  Dementia.  Non pulsatile  tinnitus. EXAM: CT HEAD WITHOUT CONTRAST TECHNIQUE: Contiguous axial images were obtained from the base of the skull through the vertex without intravenous contrast. COMPARISON:  Brain MRI November 28, 2015 and head CT December 17, 2012 FINDINGS: Brain: There is mild diffuse atrophy. There is no intracranial mass, hemorrhage, extra-axial fluid collection, or midline shift. There is patchy small vessel disease in the centra semiovale bilaterally. There are prior lacunar type infarcts in the anterior limbs of the external and internal capsules on the left as well as in the right internal capsule anteriorly. There is small vessel disease in each lateral thalamus region. No acute infarct is demonstrable on this study. Vascular: No hyperdense vessel appreciable. There is calcification in each carotid siphon region. Skull: Bony calvarium appears intact. Sinuses/Orbits: Mucosal thickening is noted in several ethmoid air cells bilaterally. Other paranasal sinuses are clear. Orbits appear symmetric bilaterally. Other: There is inferior mastoid disease on both sides without air-fluid level. More superior mastoids appear clear bilaterally. IMPRESSION: Atrophy with supratentorial small vessel disease. No acute infarct evident. No mass, hemorrhage, or extra-axial fluid collection. Foci of arterial vascular calcification noted. Mucosal thickening noted in several ethmoid air cells. Inferior mastoid disease bilaterally. Electronically Signed   By: Lowella Grip III M.D.   On: 08/18/2017 14:37    Procedures Procedures (including critical care time)  Medications Ordered in ED Medications  sodium chloride 0.9 % bolus 1,000 mL (0 mLs Intravenous Stopped 08/18/17 1557)     Initial Impression / Assessment and Plan / ED Course  I have reviewed the triage vital signs and the nursing notes.  Pertinent labs & imaging results that were available during my care of the patient were reviewed by me and considered in my medical  decision making (see chart for details).     Patient presenting with planes of ongoing chest heaviness as well as fluctuations in her blood pressure.  She also complains of several days of ringing in her ears.  Her neurologic exam is normal.  She has no current chest pain.  Her primary physician has been adjusting her blood pressure medications over the past couple of months.  Her workup in the ED is reassuring including head CT chest x-ray labs and EKG.  Family is concerned that she  has been having signs of depression associated with her recent dementia diagnosis.  She is not currently suicidal.  I have provided family with resource information.  They have follow-up scheduled with cardiology tomorrow.  Final Clinical Impressions(s) / ED Diagnoses   Final diagnoses:  Atypical chest pain  Tinnitus, unspecified laterality    ED Discharge Orders    None       Pixie Casino, MD 08/18/17 [phone]

## 2017-08-18 NOTE — ED Triage Notes (Signed)
Pt brought to ER for complaint of chest heaviness x2 weeks. Denies symptoms at this time. Family reports new onset dementia. VSS at this time. Pt in NAD. Family reports recent MI with stent placement in June.

## 2017-08-18 NOTE — Discharge Instructions (Signed)
Return to the ED with any concerns including difficulty breathing, fainting, chest pain, leg swelling, decreased level of alertness/lethargy, or any other alarming symptoms

## 2017-08-18 NOTE — ED Notes (Signed)
Pt left w/o signing for paperwork.

## 2017-08-19 ENCOUNTER — Ambulatory Visit: Payer: Medicare HMO | Admitting: Physician Assistant

## 2017-08-23 NOTE — Progress Notes (Signed)
Cardiology Office Note   Date:  08/25/2017   ID:  Shawna Lynch, DOB 12-Apr-1935, MRN [id number]  PCP:  Cyndi Bender, PA-C  Cardiologist:   Minus Breeding, MD    Chief Complaint  Patient presents with  . Chest Pain      History of Present Illness: Shawna Lynch is a 82 y.o. female who presents for follow up of NSTEMI . She underwent cardiac catheterization on 04/06/2017 which revealed high-grade mid left circumflex disease and 95% proximal small D1, EF was normal. She eventually underwent PCI with DES to left circumflex, diagonal lesion was treated medically. She returned to the ED on 04/10/2017 with recurrent chest pain which was felt to be GI in nature. Her troponin was falling.  She was also evauated for anemia.  She has been followed in the office for chest pain after this managed medically and has had some fluctuating BPs.  She called the office last week for this.     She was in the emergency room on November 20 and I reviewed these records for this visit.  She had chest pain but no objective evidence of ischemia.  Her daughter took her there when she complained of just feeling so bad that something had to be done.  She complains of fullness in her chest but is very hard to quantify or qualify this as she has some severe short-term memory deficits.  She cannot recall any symptoms at this point and thinks that she feels fine.  Her daughter does bring some objective evidence of low blood pressures with a blood pressure diary.  There are occasional higher blood pressures but for the most part her systolics in the 469G.  She has not had any presyncope or syncope.   Past Medical History:  Diagnosis Date  . Anxiety   . Arthritis    oa  . Coronary artery disease    04/06/17 PCI with DES to [facility], 90% in diag from LAD, nonobstructive disease in LAD, RCA. Normal EF.   [name] Dementia   . Depression   . Diabetes mellitus without complication ([place])   . Hypertension   . NSTEMI (non-ST elevated  myocardial infarction) ([place])   . TIA (transient ischemic attack)     Past Surgical History:  Procedure Laterality Date  . ABDOMINAL HYSTERECTOMY    . CORONARY STENT INTERVENTION  04/06/2017  . CORONARY STENT INTERVENTION N/A 04/06/2017   Procedure: Coronary Stent Intervention;  Surgeon: Sherren Mocha, MD;  Location: [place] [facility];  Service: Cardiovascular;  Laterality: N/A;  . LEFT HEART CATH AND CORONARY ANGIOGRAPHY N/A 04/06/2017   Procedure: Left Heart Cath and Coronary Angiography;  Surgeon: Sherren Mocha, MD;  Location: [place] [facility];  Service: Cardiovascular;  Laterality: N/A;     Current Outpatient Medications  Medication Sig Dispense Refill  . aspirin EC 81 MG EC tablet Take 1 tablet (81 mg total) by mouth daily. 30 tablet   . atorvastatin (LIPITOR) 80 MG tablet Take 1 tablet (80 mg total) by mouth daily at 6 PM. 90 tablet 1  . carvedilol (COREG) 25 MG tablet Take 12.5 mg by mouth 2 (two) times daily with a meal.    . cholecalciferol (VITAMIN D) 1000 units tablet Take 1,000 Units by mouth daily.    . clopidogrel (PLAVIX) 75 MG tablet Take 1 tablet (75 mg total) by mouth daily with breakfast. 90 tablet 3  . donepezil (ARICEPT) 10 MG tablet Take 10 mg by mouth at bedtime.    [name]  Dulaglutide (TRULICITY [place]) Inject 4.40 mg into the skin once a week.    [name] FISH OIL-BORAGE-FLAX-SAFFLOWER PO Take 1 capsule by mouth daily.    . isosorbide mononitrate (IMDUR) 60 MG 24 hr tablet Take 1 tablet (60 mg total) by mouth daily. 90 tablet 3  . lisinopril (PRINIVIL,ZESTRIL) 10 MG tablet Take 1 tablet (10 mg total) by mouth daily. 90 tablet 3  . memantine (NAMENDA XR) 28 MG CP24 24 hr capsule Take 28 mg by mouth at bedtime.    . nitroGLYCERIN (NITROSTAT) 0.4 MG SL tablet Place 1 tablet (0.4 mg total) under the tongue every 5 (five) minutes x 3 doses as needed for chest pain. 25 tablet 2  . omega-3 acid ethyl esters (LOVAZA) 1 g capsule Take 1 capsule (1 g total) by mouth 2 (two) times daily.  180 capsule 3  . pantoprazole (PROTONIX) 40 MG tablet Take 1 tablet (40 mg total) by mouth daily. 90 tablet 0  . pioglitazone (ACTOS) 45 MG tablet Take 45 mg by mouth daily.    . sertraline (ZOLOFT) 100 MG tablet Take 100 mg by mouth daily.    . vitamin B-12 (CYANOCOBALAMIN) 1000 MCG tablet Take 1,000 mcg by mouth daily.     No current facility-administered medications for this visit.     Allergies:   Penicillins; Sulfa antibiotics; and Codeine    ROS:  Please see the history of present illness.   Otherwise, review of systems are positive for none.   All other systems are reviewed and negative.    PHYSICAL EXAM: VS:  BP 100/60   Pulse 78   Ht 5\' 6"  (1.676 m)   Wt 141 lb 3.2 oz (64 kg)   SpO2 96%   BMI 22.79 kg/m  , BMI Body mass index is 22.79 kg/m. GENERAL:  Well appearing NECK:  No jugular venous distention, waveform within normal limits, carotid upstroke brisk and symmetric, no bruits, no thyromegaly LUNGS:  Clear to auscultation bilaterally CHEST:  Unremarkable HEART:  PMI not displaced or sustained,S1 and S2 within normal limits, no S3, no S4, no clicks, no rubs, no murmurs ABD:  Flat, positive bowel sounds normal in frequency in pitch, no bruits, no rebound, no guarding, no midline pulsatile mass, no hepatomegaly, no splenomegaly EXT:  2 plus pulses throughout, no edema, no cyanosis no clubbing    EKG:  EKG is not ordered today.    Recent Labs: 08/18/2017: BUN 19; Creatinine, Ser 1.59; Hemoglobin 11.0; Platelets 194; Potassium 3.9; Sodium 141    Lipid Panel No results found for: CHOL, TRIG, HDL, CHOLHDL, VLDL, LDLCALC, LDLDIRECT    Wt Readings from Last 3 Encounters:  08/25/17 141 lb 3.2 oz (64 kg)  05/12/17 146 lb (66.2 kg)  04/27/17 122 lb 3.2 oz (55.4 kg)      Other studies Reviewed: Additional studies/ records that were reviewed today include: ED records 11/20. Review of the above records demonstrates:  Please see elsewhere in the note.      ASSESSMENT AND PLAN:  CAD: She may be having angina although is very difficult to assess.  On the last 2 emergency room visits there was no objective evidence of ischemia.  I will continue to manage this medically.  I am going to increase her Imdur to 60 mg daily.  We talked about as needed nitroglycerin use.  HTN: Blood pressures have been running low.  I would reduce her lisinopril 10 mg daily.  DYSLIPIDEMIA: LDL was 47 in September.  I reviewed  outside labs it was scanned in.  She will remain on the meds as listed.  DM:  A1c in September was 7.3.  This is reasonable given her age.  No change in therapy is suggested.  Current medicines are reviewed at length with the patient today.  The patient does not have concerns regarding medicines.  The following changes have been made:  As above.  Labs/ tests ordered today include: None No orders of the defined types were placed in this encounter.    Disposition:   FU with Kerin Ransom PAc in 2 months.     Signed, Minus Breeding, MD  08/25/2017 2:10 PM    [facility]

## 2017-08-24 NOTE — Telephone Encounter (Signed)
No answer or VM pickup when dialed.  Pt has appt tomorrow w Dr. Percival Spanish.

## 2017-08-25 ENCOUNTER — Encounter: Payer: Self-pay | Admitting: Cardiology

## 2017-08-25 ENCOUNTER — Ambulatory Visit: Payer: Medicare HMO | Admitting: Cardiology

## 2017-08-25 VITALS — BP 100/60 | HR 78 | Ht 66.0 in | Wt 141.2 lb

## 2017-08-25 DIAGNOSIS — I10 Essential (primary) hypertension: Secondary | ICD-10-CM

## 2017-08-25 DIAGNOSIS — E785 Hyperlipidemia, unspecified: Secondary | ICD-10-CM

## 2017-08-25 DIAGNOSIS — E118 Type 2 diabetes mellitus with unspecified complications: Secondary | ICD-10-CM

## 2017-08-25 DIAGNOSIS — I25119 Atherosclerotic heart disease of native coronary artery with unspecified angina pectoris: Secondary | ICD-10-CM

## 2017-08-25 MED ORDER — LISINOPRIL 10 MG PO TABS: 10.0000 mg | ORAL_TABLET | Freq: Every day | ORAL | 3 refills | Status: DC

## 2017-08-25 MED ORDER — ISOSORBIDE MONONITRATE ER 60 MG PO TB24: 60.0000 mg | ORAL_TABLET | Freq: Every day | ORAL | 3 refills | Status: DC

## 2017-08-25 NOTE — Patient Instructions (Signed)
Medication Instructions:  INCREASE- Isosorbide 60 mg daily DECREASE- Lisinopril 10 mg daily  If you need a refill on your cardiac medications before your next appointment, please call your pharmacy.  Labwork: None Ordered   Testing/Procedures: None Ordered  Follow-Up: Your physician wants you to follow-up in: 2 Months with Kerin Ransom.    Thank you for choosing [facility] at [facility]!!

## 2017-09-03 ENCOUNTER — Ambulatory Visit: Payer: Medicare HMO | Admitting: Neurology

## 2017-09-03 ENCOUNTER — Encounter: Payer: Self-pay | Admitting: Neurology

## 2017-09-03 VITALS — BP 98/52 | HR 80 | Ht 66.0 in | Wt 140.0 lb

## 2017-09-03 DIAGNOSIS — R413 Other amnesia: Secondary | ICD-10-CM | POA: Diagnosis not present

## 2017-09-03 DIAGNOSIS — R799 Abnormal finding of blood chemistry, unspecified: Secondary | ICD-10-CM | POA: Diagnosis not present

## 2017-09-03 DIAGNOSIS — R52 Pain, unspecified: Secondary | ICD-10-CM | POA: Diagnosis not present

## 2017-09-03 DIAGNOSIS — R51 Headache: Secondary | ICD-10-CM | POA: Diagnosis not present

## 2017-09-03 DIAGNOSIS — E538 Deficiency of other specified B group vitamins: Secondary | ICD-10-CM | POA: Diagnosis not present

## 2017-09-03 DIAGNOSIS — F0150 Vascular dementia without behavioral disturbance: Secondary | ICD-10-CM | POA: Diagnosis not present

## 2017-09-03 NOTE — Progress Notes (Signed)
PATIENT: Shawna Lynch DOB: Sep 06, 1935  Chief Complaint  Patient presents with  . Headache    She is here with her daughter, [name].  Reports no further issues with headaches but she has ringing in her ears..  . Memory Loss    MMSE 23/30 - 8 animals.  She is currently taking Aricept 10mg  daily.  Her daughter feels her memory is worse.  She lives alone but her daughter resides across the street.  [name] PCP    Cyndi Bender, PA-C     HISTORICAL  Shawna Lynch is a 82 years old female, accompanied by her daughter [name], seen in refer by her primary care PA for evaluation of memory loss, headache, initial evaluation was on September 03, 2017.  I reviewed and summarized the referring note, she has past medical history of hypertension, diabetes, hyperlipidemia, coronary artery disease, memory loss, taking Namenda Exar 28 mg, donepezil 10 mg, since March 2018,  Retired from Psychologist, educational job, lives across the street from her daughter by herself, she was noted to have gradual onset memory loss since 2017, no longer able to attend her weekly get together with her friends regularly, her daughter has to take over financial part since 2017,  She also reported a history of long history of headaches, now she has different kind of headaches, pressure, symmetric, no significant light noise sensitivity,  She was started on Aricept and Namenda March 2017, tolerating the medication well, there was no significant improvement, he is also taking Zoloft 100 mg a day for depression,  Laboratory evaluation in 2018, CBC mild anemia hemoglobin of 11, BMP showed elevated creatinine 1.59  Personally reviewed CT head in November 2018, MRI of the brain 2017, generalized atrophy, supratentorium small vessel disease.   REVIEW OF SYSTEMS: Full 14 system review of systems performed and notable only for fatigue, ringing in ears, memory loss, headaches, dizziness, sleepiness, depression, decreased energy, disinterested in  activities,  ALLERGIES: Allergies  Allergen Reactions  . Penicillins Hives  . Sulfa Antibiotics Rash  . Codeine Rash    HOME MEDICATIONS: Current Outpatient Medications  Medication Sig Dispense Refill  . aspirin EC 81 MG EC tablet Take 1 tablet (81 mg total) by mouth daily. 30 tablet   . atorvastatin (LIPITOR) 80 MG tablet Take 1 tablet (80 mg total) by mouth daily at 6 PM. 90 tablet 1  . carvedilol (COREG) 25 MG tablet Take 12.5 mg by mouth 2 (two) times daily with a meal.    . cholecalciferol (VITAMIN D) 1000 units tablet Take 1,000 Units by mouth daily.    . clopidogrel (PLAVIX) 75 MG tablet Take 1 tablet (75 mg total) by mouth daily with breakfast. 90 tablet 3  . donepezil (ARICEPT) 10 MG tablet Take 10 mg by mouth at bedtime.    . Dulaglutide (TRULICITY [place]) Inject 2.63 mg into the skin once a week.    [name] FISH OIL-BORAGE-FLAX-SAFFLOWER PO Take 1 capsule by mouth daily.    . isosorbide mononitrate (IMDUR) 60 MG 24 hr tablet Take 1 tablet (60 mg total) by mouth daily. 90 tablet 3  . lisinopril (PRINIVIL,ZESTRIL) 10 MG tablet Take 1 tablet (10 mg total) by mouth daily. 90 tablet 3  . memantine (NAMENDA XR) 28 MG CP24 24 hr capsule Take 28 mg by mouth at bedtime.    . nitroGLYCERIN (NITROSTAT) 0.4 MG SL tablet Place 1 tablet (0.4 mg total) under the tongue every 5 (five) minutes x 3 doses as needed for chest  pain. 25 tablet 2  . omega-3 acid ethyl esters (LOVAZA) 1 g capsule Take 1 capsule (1 g total) by mouth 2 (two) times daily. 180 capsule 3  . pantoprazole (PROTONIX) 40 MG tablet Take 1 tablet (40 mg total) by mouth daily. 90 tablet 0  . pioglitazone (ACTOS) 45 MG tablet Take 45 mg by mouth daily.    . sertraline (ZOLOFT) 100 MG tablet Take 100 mg by mouth daily.    . vitamin B-12 (CYANOCOBALAMIN) 1000 MCG tablet Take 1,000 mcg by mouth daily.     No current facility-administered medications for this visit.     PAST MEDICAL HISTORY: Past Medical History:  Diagnosis Date  .  Anxiety   . Arthritis    oa  . Coronary artery disease    04/06/17 PCI with DES to [facility], 90% in diag from LAD, nonobstructive disease in LAD, RCA. Normal EF.   [name] Dementia   . Depression   . Diabetes mellitus without complication ([place])   . Hypertension   . NSTEMI (non-ST elevated myocardial infarction) ([place])   . Ringing in ears   . TIA (transient ischemic attack)     PAST SURGICAL HISTORY: Past Surgical History:  Procedure Laterality Date  . ABDOMINAL HYSTERECTOMY    . CORONARY STENT INTERVENTION  04/06/2017  . CORONARY STENT INTERVENTION N/A 04/06/2017   Procedure: Coronary Stent Intervention;  Surgeon: Sherren Mocha, MD;  Location: [place] [facility];  Service: Cardiovascular;  Laterality: N/A;  . LEFT HEART CATH AND CORONARY ANGIOGRAPHY N/A 04/06/2017   Procedure: Left Heart Cath and Coronary Angiography;  Surgeon: Sherren Mocha, MD;  Location: [place] [facility];  Service: Cardiovascular;  Laterality: N/A;    FAMILY HISTORY: Family History  Problem Relation Age of Onset  . CAD Father   . Cancer Sister   . Hearing loss Mother   . Hypertension Mother     SOCIAL HISTORY:  Social History   Socioeconomic History  . Marital status: Married    Spouse name: Not on file  . Number of children: 2  . Years of education: 82  . Highest education level: High school graduate  Social Needs  . Financial resource strain: Not on file  . Food insecurity - worry: Not on file  . Food insecurity - inability: Not on file  . Transportation needs - medical: Not on file  . Transportation needs - non-medical: Not on file  Occupational History  . Occupation: Retired  Tobacco Use  . Smoking status: Former Research scientist (life sciences)  . Smokeless tobacco: Never Used  . Tobacco comment: quit "20 years ago"  Substance and Sexual Activity  . Alcohol use: No  . Drug use: No  . Sexual activity: Not on file  Other Topics Concern  . Not on file  Social History Narrative   Lives alone but daughter is right across  the street.   Right-handed.   No caffeine use.     PHYSICAL EXAM   Vitals:   09/03/17 0948  BP: (!) 98/52  Pulse: 80  Weight: 140 lb (63.5 kg)  Height: 5\' 6"  (1.676 m)    Not recorded      Body mass index is 22.6 kg/m.  PHYSICAL EXAMNIATION:  Gen: NAD, conversant, well nourised, obese, well groomed                     Cardiovascular: Regular rate rhythm, no peripheral edema, warm, nontender. Eyes: Conjunctivae clear without exudates or hemorrhage Neck: Supple, no carotid  bruits. Pulmonary: Clear to auscultation bilaterally   NEUROLOGICAL EXAM: MMSE - Mini Mental State Exam 09/03/2017  Orientation to time 2  Orientation to Place 5  Registration 3  Attention/ Calculation 5  Recall 0  Language- name 2 objects 2  Language- repeat 1  Language- follow 3 step command 3  Language- read & follow direction 1  Write a sentence 1  Copy design 0  Total score 23     CRANIAL NERVES: CN II: Visual fields are full to confrontation. Fundoscopic exam is normal with sharp discs and no vascular changes. Pupils are round equal and briskly reactive to light. CN III, IV, VI: extraocular movement are normal. No ptosis. CN V: Facial sensation is intact to pinprick in all 3 divisions bilaterally. Corneal responses are intact.  CN VII: Face is symmetric with normal eye closure and smile. CN VIII: Hearing is normal to rubbing fingers CN IX, X: Palate elevates symmetrically. Phonation is normal. CN XI: Head turning and shoulder shrug are intact CN XII: Tongue is midline with normal movements and no atrophy.  MOTOR: There is no pronator drift of out-stretched arms. Muscle bulk and tone are normal. Muscle strength is normal.  REFLEXES: Reflexes are 2+ and symmetric at the biceps, triceps, knees, and ankles. Plantar responses are flexor.  SENSORY: Intact to light touch, pinprick, positional sensation and vibratory sensation are intact in fingers and toes.  COORDINATION: Rapid  alternating movements and fine finger movements are intact. There is no dysmetria on finger-to-nose and heel-knee-shin.    GAIT/STANCE: Posture is normal. Gait is steady with normal steps, base, arm swing, and turning. Heel and toe walking are normal. Tandem gait is normal.  Romberg is absent.   DIAGNOSTIC DATA (LABS, IMAGING, TESTING) - I reviewed patient records, labs, notes, testing and imaging myself where available.   ASSESSMENT AND PLAN  Shawna Lynch is a 82 y.o. female   Mild cognitive impairment  Laboratory evaluation to rule out treatable etiology.  Most consistent with central nervous system degenerative disorder, CAT scan on the previous MRI of the brain showed significant atrophy  Continue Aricept 10 mg daily, Namenda xr 28 mg  Marcial Pacas, M.D. Ph.D.  [facility] [address] [place], [place] [address] Ph: [phone] Fax: [phone]  CC: Referring Provider

## 2017-09-04 LAB — TSH: TSH: 2.51 u[IU]/mL (ref 0.450–4.500)

## 2017-09-04 LAB — FOLATE: FOLATE: 7.3 ng/mL (ref >3.0)

## 2017-09-04 LAB — SEDIMENTATION RATE: Sed Rate: 13 mm/hr (ref 0–40)

## 2017-09-04 LAB — RPR: RPR Ser Ql: NONREACTIVE

## 2017-09-04 LAB — C-REACTIVE PROTEIN

## 2017-09-04 LAB — VITAMIN B12: VITAMIN B 12: 775 pg/mL (ref 232–1245)

## 2017-09-06 ENCOUNTER — Telehealth: Payer: Self-pay | Admitting: *Deleted

## 2017-09-06 NOTE — Telephone Encounter (Signed)
-----   Message from Marcial Pacas, MD sent at 09/04/2017  9:27 AM EST ----- Please call patient for normal laboratory result

## 2017-09-06 NOTE — Telephone Encounter (Signed)
Left patient a detailed message, with results, on her daughter's voicemail (ok per DPR).  Provided our number to call back with any questions.

## 2017-09-14 DIAGNOSIS — Z6823 Body mass index (BMI) 23.0-23.9, adult: Secondary | ICD-10-CM | POA: Diagnosis not present

## 2017-09-14 DIAGNOSIS — E1122 Type 2 diabetes mellitus with diabetic chronic kidney disease: Secondary | ICD-10-CM | POA: Diagnosis not present

## 2017-10-28 ENCOUNTER — Encounter: Payer: Self-pay | Admitting: Cardiology

## 2017-10-28 ENCOUNTER — Ambulatory Visit: Payer: Medicare HMO | Admitting: Cardiology

## 2017-10-28 VITALS — BP 102/52 | HR 74 | Ht 66.0 in | Wt 132.0 lb

## 2017-10-28 DIAGNOSIS — I2510 Atherosclerotic heart disease of native coronary artery without angina pectoris: Secondary | ICD-10-CM

## 2017-10-28 DIAGNOSIS — E119 Type 2 diabetes mellitus without complications: Secondary | ICD-10-CM | POA: Diagnosis not present

## 2017-10-28 DIAGNOSIS — F0150 Vascular dementia without behavioral disturbance: Secondary | ICD-10-CM

## 2017-10-28 DIAGNOSIS — Z9861 Coronary angioplasty status: Secondary | ICD-10-CM | POA: Diagnosis not present

## 2017-10-28 DIAGNOSIS — I252 Old myocardial infarction: Secondary | ICD-10-CM | POA: Diagnosis not present

## 2017-10-28 DIAGNOSIS — E785 Hyperlipidemia, unspecified: Secondary | ICD-10-CM

## 2017-10-28 DIAGNOSIS — I10 Essential (primary) hypertension: Secondary | ICD-10-CM | POA: Diagnosis not present

## 2017-10-28 NOTE — Patient Instructions (Signed)
Stop Lisinopril    Call if top blood pressure consistently greater than 130 or less than 100    Your physician wants you to follow-up in: 6 months with Dr.Hochrein. You will receive a reminder letter in the mail two months in advance. If you don't receive a letter, please call our office to schedule the follow-up appointment.

## 2017-10-28 NOTE — Progress Notes (Signed)
10/28/2017 Shawna Lynch   Jan 10, 1935  [id number]  Primary Physician Cyndi Bender, PA-C Primary Cardiologist: Dr Percival Spanish  HPI:  83 y/o female seen in the office today accompanied by her son. The pt has dementia and is unable to give any details of her history. The pt does live alone in her own home, her daughter lives across the street and lays out her mother's medication. The pt was admitted 04/03/17 with chest pain and ruled in for a NSTEMI- Troponin 6. She underwent cath 04/06/17 which revealed high grade mCFX disease and 95% proximal small Dx1. Her LVF was normal. She underwent PCI with DES to the CFX. The plan was for medical Rx of her Dx. She had recurrent chest pain as an OP and nitrates were added. She saw Dr Percival Spanish in Nov and her Imdur was increased again. She is seeing me today in follow up.   The pt is pleasant, denies chest pain and her son confirms this. Her B/P is running low but there is no history of falls or syncope. I took her B/P myself and got 92 systolic.      Current Outpatient Medications  Medication Sig Dispense Refill  . aspirin EC 81 MG EC tablet Take 1 tablet (81 mg total) by mouth daily. 30 tablet   . atorvastatin (LIPITOR) 80 MG tablet Take 1 tablet (80 mg total) by mouth daily at 6 PM. 90 tablet 1  . carvedilol (COREG) 25 MG tablet Take 12.5 mg by mouth 2 (two) times daily with a meal.    . cholecalciferol (VITAMIN D) 1000 units tablet Take 1,000 Units by mouth daily.    . clopidogrel (PLAVIX) 75 MG tablet Take 1 tablet (75 mg total) by mouth daily with breakfast. 90 tablet 3  . donepezil (ARICEPT) 10 MG tablet Take 10 mg by mouth at bedtime.    . Dulaglutide (TRULICITY ) Inject 5.85 mg into the skin once a week.    [name] FISH OIL-BORAGE-FLAX-SAFFLOWER PO Take 1 capsule by mouth daily.    . isosorbide mononitrate (IMDUR) 60 MG 24 hr tablet Take 1 tablet (60 mg total) by mouth daily. 90 tablet 3  . memantine (NAMENDA XR) 28 MG CP24 24 hr capsule Take 28 mg by  mouth at bedtime.    . nitroGLYCERIN (NITROSTAT) 0.4 MG SL tablet Place 1 tablet (0.4 mg total) under the tongue every 5 (five) minutes x 3 doses as needed for chest pain. 25 tablet 2  . omega-3 acid ethyl esters (LOVAZA) 1 g capsule Take 1 capsule (1 g total) by mouth 2 (two) times daily. 180 capsule 3  . pantoprazole (PROTONIX) 40 MG tablet Take 1 tablet (40 mg total) by mouth daily. 90 tablet 0  . pioglitazone (ACTOS) 45 MG tablet Take 45 mg by mouth daily.    . sertraline (ZOLOFT) 100 MG tablet Take 100 mg by mouth daily.    . vitamin B-12 (CYANOCOBALAMIN) 1000 MCG tablet Take 1,000 mcg by mouth daily.     No current facility-administered medications for this visit.     Allergies  Allergen Reactions  . Penicillins Hives  . Sulfa Antibiotics Rash  . Codeine Rash    Past Medical History:  Diagnosis Date  . Anxiety   . Arthritis    oa  . Coronary artery disease    04/06/17 PCI with DES to [facility], 90% in diag from LAD, nonobstructive disease in LAD, RCA. Normal EF.   [name] Dementia   . Depression   .  Diabetes mellitus without complication ([place])   . Hypertension   . NSTEMI (non-ST elevated myocardial infarction) ([place])   . Ringing in ears   . TIA (transient ischemic attack)     Social History   Socioeconomic History  . Marital status: Married    Spouse name: Not on file  . Number of children: 2  . Years of education: 54  . Highest education level: High school graduate  Social Needs  . Financial resource strain: Not on file  . Food insecurity - worry: Not on file  . Food insecurity - inability: Not on file  . Transportation needs - medical: Not on file  . Transportation needs - non-medical: Not on file  Occupational History  . Occupation: Retired  Tobacco Use  . Smoking status: Former Research scientist (life sciences)  . Smokeless tobacco: Never Used  . Tobacco comment: quit "20 years ago"  Substance and Sexual Activity  . Alcohol use: No  . Drug use: No  . Sexual activity: Not on file  Other  Topics Concern  . Not on file  Social History Narrative   Lives alone but daughter is right across the street.   Right-handed.   No caffeine use.     Family History  Problem Relation Age of Onset  . CAD Father   . Cancer Sister   . Hearing loss Mother   . Hypertension Mother      Review of Systems: General: negative for chills, fever, night sweats or weight changes.  Cardiovascular: negative for chest pain, dyspnea on exertion, edema, orthopnea, palpitations, paroxysmal nocturnal dyspnea or shortness of breath Dermatological: negative for rash Respiratory: negative for cough or wheezing Urologic: negative for hematuria Abdominal: negative for nausea, vomiting, diarrhea, bright red blood per rectum, melena, or hematemesis Neurologic: negative for visual changes, syncope, or dizziness All other systems reviewed and are otherwise negative except as noted above.    Blood pressure (!) 102/52, pulse 74, height 5\' 6"  (1.676 m), weight 132 lb (59.9 kg).  General appearance: alert, cooperative and no distress Neck: no carotid bruit and no JVD Lungs: clear to auscultation bilaterally Heart: regular rate and rhythm Extremities: extremities normal, atraumatic, no cyanosis or edema Skin: pale, cool, dry Neurologic: Grossly normal   ASSESSMENT AND PLAN:   Essential hypertension Now hypotensive. Stop Lisinopril, call is systolic running > 800 or diastolic < 349  CAD S/P percutaneous coronary angioplasty mCFX PCI/DES with residual 95% Dx1-(med Rx) 04/06/17  Dementia Mild. Pt lives alone, daughter across the street does her medications and checks on her.  Dyslipidemia On high Lynch statin  History of non-ST elevation myocardial infarction (NSTEMI) 04/03/17-Troponin peak 6. Treated with CFX PCI. Normal LVF by echo then.   PLAN  I stopped her Lisinopril. F/U with Dr Percival Spanish 6 months.   Kerin Ransom PA-C 10/28/2017 9:36 AM

## 2017-10-28 NOTE — Assessment & Plan Note (Signed)
On high dose statin.

## 2017-10-28 NOTE — Assessment & Plan Note (Signed)
Mild. Pt lives alone, daughter across the street does her medications and checks on her.

## 2017-10-28 NOTE — Assessment & Plan Note (Signed)
04/03/17-Troponin peak 6. Treated with CFX PCI. Normal LVF by echo then.

## 2017-10-28 NOTE — Assessment & Plan Note (Signed)
mCFX PCI/DES with residual 95% Dx1-(med Rx) 04/06/17

## 2017-10-28 NOTE — Assessment & Plan Note (Signed)
Now hypotensive. Stop Lisinopril, call is systolic running > 254 or diastolic < 862

## 2017-11-09 DIAGNOSIS — E1122 Type 2 diabetes mellitus with diabetic chronic kidney disease: Secondary | ICD-10-CM | POA: Diagnosis not present

## 2017-11-09 DIAGNOSIS — D649 Anemia, unspecified: Secondary | ICD-10-CM | POA: Diagnosis not present

## 2017-11-09 DIAGNOSIS — N183 Chronic kidney disease, stage 3 (moderate): Secondary | ICD-10-CM | POA: Diagnosis not present

## 2017-11-16 DIAGNOSIS — E538 Deficiency of other specified B group vitamins: Secondary | ICD-10-CM | POA: Diagnosis not present

## 2017-11-16 DIAGNOSIS — E7800 Pure hypercholesterolemia, unspecified: Secondary | ICD-10-CM | POA: Diagnosis not present

## 2017-11-16 DIAGNOSIS — I10 Essential (primary) hypertension: Secondary | ICD-10-CM | POA: Diagnosis not present

## 2017-11-16 DIAGNOSIS — Z23 Encounter for immunization: Secondary | ICD-10-CM | POA: Diagnosis not present

## 2017-11-16 DIAGNOSIS — E1122 Type 2 diabetes mellitus with diabetic chronic kidney disease: Secondary | ICD-10-CM | POA: Diagnosis not present

## 2017-11-16 DIAGNOSIS — F0280 Dementia in other diseases classified elsewhere without behavioral disturbance: Secondary | ICD-10-CM | POA: Diagnosis not present

## 2017-11-16 DIAGNOSIS — L989 Disorder of the skin and subcutaneous tissue, unspecified: Secondary | ICD-10-CM | POA: Diagnosis not present

## 2017-12-01 DIAGNOSIS — L0100 Impetigo, unspecified: Secondary | ICD-10-CM | POA: Diagnosis not present

## 2017-12-01 DIAGNOSIS — L570 Actinic keratosis: Secondary | ICD-10-CM | POA: Diagnosis not present

## 2017-12-01 DIAGNOSIS — L280 Lichen simplex chronicus: Secondary | ICD-10-CM | POA: Diagnosis not present

## 2017-12-01 DIAGNOSIS — L820 Inflamed seborrheic keratosis: Secondary | ICD-10-CM | POA: Diagnosis not present

## 2017-12-01 DIAGNOSIS — L0889 Other specified local infections of the skin and subcutaneous tissue: Secondary | ICD-10-CM | POA: Diagnosis not present

## 2017-12-01 DIAGNOSIS — L281 Prurigo nodularis: Secondary | ICD-10-CM | POA: Diagnosis not present

## 2017-12-01 DIAGNOSIS — L821 Other seborrheic keratosis: Secondary | ICD-10-CM | POA: Diagnosis not present

## 2017-12-16 DIAGNOSIS — L570 Actinic keratosis: Secondary | ICD-10-CM | POA: Diagnosis not present

## 2017-12-16 DIAGNOSIS — L280 Lichen simplex chronicus: Secondary | ICD-10-CM | POA: Diagnosis not present

## 2017-12-29 ENCOUNTER — Other Ambulatory Visit: Payer: Self-pay | Admitting: Cardiology

## 2017-12-29 MED ORDER — CLOPIDOGREL BISULFATE 75 MG PO TABS: 75.0000 mg | ORAL_TABLET | Freq: Every day | ORAL | 3 refills | Status: DC

## 2017-12-29 NOTE — Telephone Encounter (Signed)
Rx(s) sent to pharmacy electronically.  

## 2017-12-31 ENCOUNTER — Other Ambulatory Visit: Payer: Self-pay

## 2017-12-31 MED ORDER — CLOPIDOGREL BISULFATE 75 MG PO TABS: 75.0000 mg | ORAL_TABLET | Freq: Every day | ORAL | 3 refills | Status: DC

## 2018-01-06 ENCOUNTER — Telehealth: Payer: Self-pay | Admitting: Cardiology

## 2018-01-06 MED ORDER — ISOSORBIDE MONONITRATE ER 60 MG PO TB24: 60.0000 mg | ORAL_TABLET | Freq: Every day | ORAL | 3 refills | Status: DC

## 2018-01-06 NOTE — Telephone Encounter (Signed)
Returned call to patient, Isosorbide refill sent to [facility] mail order pharmacy.

## 2018-01-06 NOTE — Telephone Encounter (Signed)
New message   Pt c/o medication issue:  1. Name of Medication: isosorbide mononitrate (IMDUR) 60 MG 24 hr tablet  2. How are you currently taking this medication (dosage and times per day)? n/a  3. Are you having a reaction (difficulty breathing--STAT)? n/a 4. What is your medication issue? [employer] requesting new prescription for patient

## 2018-02-10 DIAGNOSIS — L280 Lichen simplex chronicus: Secondary | ICD-10-CM | POA: Diagnosis not present

## 2018-02-16 DIAGNOSIS — E1122 Type 2 diabetes mellitus with diabetic chronic kidney disease: Secondary | ICD-10-CM | POA: Diagnosis not present

## 2018-02-16 DIAGNOSIS — E538 Deficiency of other specified B group vitamins: Secondary | ICD-10-CM | POA: Diagnosis not present

## 2018-02-18 DIAGNOSIS — F419 Anxiety disorder, unspecified: Secondary | ICD-10-CM | POA: Diagnosis not present

## 2018-02-18 DIAGNOSIS — E1122 Type 2 diabetes mellitus with diabetic chronic kidney disease: Secondary | ICD-10-CM | POA: Diagnosis not present

## 2018-02-18 DIAGNOSIS — L280 Lichen simplex chronicus: Secondary | ICD-10-CM | POA: Diagnosis not present

## 2018-02-18 DIAGNOSIS — R413 Other amnesia: Secondary | ICD-10-CM | POA: Diagnosis not present

## 2018-02-18 DIAGNOSIS — Z6824 Body mass index (BMI) 24.0-24.9, adult: Secondary | ICD-10-CM | POA: Diagnosis not present

## 2018-02-18 DIAGNOSIS — N183 Chronic kidney disease, stage 3 (moderate): Secondary | ICD-10-CM | POA: Diagnosis not present

## 2018-03-09 ENCOUNTER — Encounter: Payer: Self-pay | Admitting: Neurology

## 2018-03-09 ENCOUNTER — Ambulatory Visit: Payer: Medicare HMO | Admitting: Neurology

## 2018-03-09 VITALS — BP 106/63 | HR 98 | Ht 66.0 in | Wt 140.0 lb

## 2018-03-09 DIAGNOSIS — G3184 Mild cognitive impairment, so stated: Secondary | ICD-10-CM

## 2018-03-09 DIAGNOSIS — L299 Pruritus, unspecified: Secondary | ICD-10-CM

## 2018-03-09 MED ORDER — GABAPENTIN 100 MG PO CAPS: 100.0000 mg | ORAL_CAPSULE | Freq: Three times a day (TID) | ORAL | 11 refills | Status: DC

## 2018-03-09 MED ORDER — MEMANTINE HCL-DONEPEZIL HCL ER 28-10 MG PO CP24: 1.0000 | ORAL_CAPSULE | Freq: Every day | ORAL | 4 refills | Status: DC

## 2018-03-09 NOTE — Progress Notes (Addendum)
PATIENT: Shawna Lynch DOB: May 05, 1935  Chief Complaint  Patient presents with  . Memory Loss    MMSE 26/30 - 8 animals.  She is here with her daughter, Shawna Lynch.  Feels memory is stable.  She is still living alone and driving short distances.  Her daughter lives across the street.     HISTORICAL  Shawna Lynch is a 83 years old female, accompanied by her daughter Shawna Lynch, seen in refer by her primary care PA for evaluation of memory loss, headache, initial evaluation was on September 03, 2017.  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, she was noted to have gradual onset memory loss since 2017, no longer able to attend her weekly get together with her friends regularly, her daughter has to take over financial part since 2017,  She also reported a history of long history of headaches, now she has different kind of headaches, pressure, symmetric, no significant light noise sensitivity,  She was started on Aricept and Namenda March 2017, tolerating the medication well, there was no significant improvement, he is also taking Zoloft 100 mg a day for depression,  Laboratory evaluation in 2018, CBC mild anemia hemoglobin of 11, BMP showed elevated creatinine 1.59  Personally reviewed CT head in November 2018, MRI of the brain 2017, generalized atrophy, supratentorium small vessel disease.  UPDATE March 09 2018: Laboratory evaluation in December 2018 showed normal negative vitamin B12, ESR, TSH, C-reactive protein, folic acid, RPR,  She is with her daughter at visit. She lives independently, she still drive short distance.  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 Penicillins Hives  . Sulfa Antibiotics Rash  . Codeine Rash    HOME MEDICATIONS: Current Outpatient Medications  Medication Sig Dispense Refill  . aspirin EC 81 MG EC tablet Take 1 tablet (81 mg total) by mouth daily. 30 tablet   . atorvastatin (LIPITOR) 80 MG tablet Take 1 tablet (80 mg total) by mouth daily at 6 PM. 90 tablet 1  . carvedilol (COREG) 25 MG tablet Take 12.5 mg by mouth 2 (two) times daily with a meal.    . cholecalciferol (VITAMIN D) 1000 units tablet Take 1,000 Units by mouth daily.    . clopidogrel (PLAVIX) 75 MG tablet Take 1 tablet (75 mg total) by mouth daily with breakfast. 90 tablet 3  . donepezil (ARICEPT) 10 MG tablet Take 10 mg by mouth at bedtime.    . Dulaglutide (TRULICITY [place]) Inject 6.80 mg into the skin once a week.    [name] FISH OIL-BORAGE-FLAX-SAFFLOWER PO Take 1 capsule by mouth daily.    . hydrOXYzine (ATARAX/VISTARIL) 25 MG tablet Take 25 mg by mouth 3 (three) times daily as needed.    . isosorbide mononitrate (IMDUR) 60 MG 24 hr tablet Take 1 tablet (60 mg total) by mouth daily. 90 tablet 3  . memantine (NAMENDA XR) 28 MG CP24 24 hr capsule Take  28 mg by mouth at bedtime.    . nitroGLYCERIN (NITROSTAT) 0.4 MG SL tablet Place 1 tablet (0.4 mg total) under the tongue every 5 (five) minutes x 3 doses as needed for chest pain. 25 tablet 2  . omega-3 acid ethyl esters (LOVAZA) 1 g capsule Take 1 capsule (1 g total) by mouth 2 (two) times daily. 180 capsule 3  . pantoprazole (PROTONIX) 40 MG tablet Take 1 tablet (40 mg total) by mouth daily. 90 tablet 0  . pioglitazone (ACTOS) 45 MG tablet Take 45 mg by mouth daily.    .  sertraline (ZOLOFT) 100 MG tablet Take 100 mg by mouth daily.    . vitamin B-12 (CYANOCOBALAMIN) 1000 MCG tablet Take 1,000 mcg by mouth daily.     No current facility-administered medications for this visit.     PAST MEDICAL HISTORY: Past Medical History:  Diagnosis Date  . Anxiety   . Arthritis    oa  . Coronary artery disease    04/06/17 PCI with DES to [facility], 90% in diag from LAD, nonobstructive disease in LAD, RCA. Normal EF.   [name] Dementia   . Depression   . Diabetes mellitus without complication ([place])   . Hypertension   . NSTEMI (non-ST elevated myocardial infarction) ([place])   . Ringing in ears   . TIA (transient ischemic attack)     PAST SURGICAL HISTORY: Past Surgical History:  Procedure Laterality Date  . ABDOMINAL HYSTERECTOMY    . CORONARY STENT INTERVENTION  04/06/2017  . CORONARY STENT INTERVENTION N/A 04/06/2017   Procedure: Coronary Stent Intervention;  Surgeon: Sherren Mocha, MD;  Location: [facility] [facility];  Service: Cardiovascular;  Laterality: N/A;  . LEFT HEART CATH AND CORONARY ANGIOGRAPHY N/A 04/06/2017   Procedure: Left Heart Cath and Coronary Angiography;  Surgeon: Sherren Mocha, MD;  Location: [place] [facility];  Service: Cardiovascular;  Laterality: N/A;    FAMILY HISTORY: Family History  Problem Relation Age of Onset  . CAD Father   . Cancer Sister   . Hearing loss Mother   . Hypertension Mother     SOCIAL HISTORY:  Social History   Socioeconomic History  . Marital status: Married    Spouse name: Not on file  . Number of children: 2  . Years of education: 46  . Highest education level: High school graduate  Occupational History  . Occupation: Retired  Scientific laboratory technician  . Financial resource strain: Not on file  . Food insecurity:    Worry: Not on file    Inability: Not on file  . Transportation needs:    Medical: Not on file    Non-medical: Not on file  Tobacco Use  . Smoking status: Former Research scientist (life sciences)  . Smokeless tobacco: Never Used    . Tobacco comment: quit "20 years ago"  Substance and Sexual Activity  . Alcohol use: No  . Drug use: No  . Sexual activity: Not on file  Lifestyle  . Physical activity:    Days per week: Not on file    Minutes per session: Not on file  . Stress: Not on file  Relationships  . Social connections:    Talks on phone: Not on file    Gets together: Not on file    Attends religious service: Not on file    Active member of club or organization: Not on file    Attends meetings of clubs or organizations: Not on file    Relationship status: Not on file  .  Intimate partner violence:    Fear of current or ex partner: Not on file    Emotionally abused: Not on file    Physically abused: Not on file    Forced sexual activity: Not on file  Other Topics Concern  . Not on file  Social History Narrative   Lives alone but daughter is right across the street.   Right-handed.   No caffeine use.     PHYSICAL EXAM   Vitals:   03/09/18 0849  BP: 106/63  Pulse: 98  Weight: 140 lb (63.5 kg)  Height: _0  (1.676 m)    Not recorded      Body mass index is 22.6 kg/m.  PHYSICAL EXAMNIATION:  Gen: NAD, conversant, well nourised, obese, well groomed                     Cardiovascular: Regular rate rhythm, no peripheral edema, warm, nontender. Eyes: Conjunctivae clear without exudates or hemorrhage Neck: Supple, no carotid bruits. Pulmonary: Clear to auscultation bilaterally   NEUROLOGICAL EXAM: MMSE - Mini Mental State Exam 03/09/2018 09/03/2017  Orientation to time 4 2  Orientation to Place 5 5  Registration 3 3  Attention/ Calculation 5 5  Recall 0 0  Language- name 2 objects 2 2  Language- repeat 1 1  Language- follow 3 step command 3 3  Language- read & follow direction 1 1  Write a sentence 1 1  Copy design 1 0  Total score 26 23   Animal naming 8.  CRANIAL NERVES: CN II: Visual fields are full to confrontation. Fundoscopic exam is normal with sharp discs and no  vascular changes. Pupils are round equal and briskly reactive to light. CN III, IV, VI: extraocular movement are normal. No ptosis. CN V: Facial sensation is intact to pinprick in all 3 divisions bilaterally. Corneal responses are intact.  CN VII: Face is symmetric with normal eye closure and smile. CN VIII: Hearing is normal to rubbing fingers CN IX, X: Palate elevates symmetrically. Phonation is normal. CN XI: Head turning and shoulder shrug are intact CN XII: Tongue is midline with normal movements and no atrophy.  MOTOR: There is no pronator drift of out-stretched arms. Muscle bulk and tone are normal. Muscle strength is normal.  REFLEXES: Reflexes are 2+ and symmetric at the biceps, triceps, knees, and ankles. Plantar responses are flexor.  SENSORY: Intact to light touch, pinprick, positional sensation and vibratory sensation are intact in fingers and toes.  COORDINATION: Rapid alternating movements and fine finger movements are intact. There is no dysmetria on finger-to-nose and heel-knee-shin.    GAIT/STANCE: Posture is normal. Gait is steady with normal steps, base, arm swing, and turning. Heel and toe walking are normal. Tandem gait is normal.  Romberg is absent.   DIAGNOSTIC DATA (LABS, IMAGING, TESTING) - I reviewed patient records, labs, notes, testing and imaging myself where available.   ASSESSMENT AND PLAN  Shawna Lynch is a 83 y.o. female   Mild cognitive impairment  Most consistent with central nervous system degenerative disorder,  MRI of the brain showed significant atrophy  Change to Namzaric one cap daily  Pruritis   Gabapentin 100 mg 3 times a day  Marcial Pacas, M.D. Ph.D. [address] [place], [place] [address] Ph: [phone] Fax: [phone]  CC: Referring Provider

## 2018-03-10 DIAGNOSIS — Z6824 Body mass index (BMI) 24.0-24.9, adult: Secondary | ICD-10-CM | POA: Diagnosis not present

## 2018-03-10 DIAGNOSIS — L280 Lichen simplex chronicus: Secondary | ICD-10-CM | POA: Diagnosis not present

## 2018-03-10 DIAGNOSIS — E1122 Type 2 diabetes mellitus with diabetic chronic kidney disease: Secondary | ICD-10-CM | POA: Diagnosis not present

## 2018-05-18 DIAGNOSIS — D044 Carcinoma in situ of skin of scalp and neck: Secondary | ICD-10-CM | POA: Diagnosis not present

## 2018-05-18 DIAGNOSIS — D485 Neoplasm of uncertain behavior of skin: Secondary | ICD-10-CM | POA: Diagnosis not present

## 2018-05-18 DIAGNOSIS — L280 Lichen simplex chronicus: Secondary | ICD-10-CM | POA: Diagnosis not present

## 2018-06-10 DIAGNOSIS — E785 Hyperlipidemia, unspecified: Secondary | ICD-10-CM | POA: Diagnosis not present

## 2018-06-10 DIAGNOSIS — I10 Essential (primary) hypertension: Secondary | ICD-10-CM | POA: Diagnosis not present

## 2018-06-10 DIAGNOSIS — Z9181 History of falling: Secondary | ICD-10-CM | POA: Diagnosis not present

## 2018-06-10 DIAGNOSIS — E1122 Type 2 diabetes mellitus with diabetic chronic kidney disease: Secondary | ICD-10-CM | POA: Diagnosis not present

## 2018-06-10 DIAGNOSIS — Z23 Encounter for immunization: Secondary | ICD-10-CM | POA: Diagnosis not present

## 2018-06-10 DIAGNOSIS — Z1339 Encounter for screening examination for other mental health and behavioral disorders: Secondary | ICD-10-CM | POA: Diagnosis not present

## 2018-06-10 DIAGNOSIS — Z0000 Encounter for general adult medical examination without abnormal findings: Secondary | ICD-10-CM | POA: Diagnosis not present

## 2018-06-10 DIAGNOSIS — Z1331 Encounter for screening for depression: Secondary | ICD-10-CM | POA: Diagnosis not present

## 2018-06-10 DIAGNOSIS — Z136 Encounter for screening for cardiovascular disorders: Secondary | ICD-10-CM | POA: Diagnosis not present

## 2018-06-29 DIAGNOSIS — D0462 Carcinoma in situ of skin of left upper limb, including shoulder: Secondary | ICD-10-CM | POA: Diagnosis not present

## 2018-06-29 DIAGNOSIS — L280 Lichen simplex chronicus: Secondary | ICD-10-CM | POA: Diagnosis not present

## 2018-06-29 DIAGNOSIS — Z85828 Personal history of other malignant neoplasm of skin: Secondary | ICD-10-CM | POA: Diagnosis not present

## 2018-09-09 DIAGNOSIS — E7800 Pure hypercholesterolemia, unspecified: Secondary | ICD-10-CM | POA: Diagnosis not present

## 2018-09-09 DIAGNOSIS — F0280 Dementia in other diseases classified elsewhere without behavioral disturbance: Secondary | ICD-10-CM | POA: Diagnosis not present

## 2018-09-09 DIAGNOSIS — Z6823 Body mass index (BMI) 23.0-23.9, adult: Secondary | ICD-10-CM | POA: Diagnosis not present

## 2018-09-09 DIAGNOSIS — E1122 Type 2 diabetes mellitus with diabetic chronic kidney disease: Secondary | ICD-10-CM | POA: Diagnosis not present

## 2018-09-09 DIAGNOSIS — I10 Essential (primary) hypertension: Secondary | ICD-10-CM | POA: Diagnosis not present

## 2018-09-16 DIAGNOSIS — E119 Type 2 diabetes mellitus without complications: Secondary | ICD-10-CM | POA: Diagnosis not present

## 2018-10-05 IMAGING — DX DG CHEST 2V
2 series · 2 of 2 positions shown · non-contrast
Comparison: None.

CLINICAL DATA: Acute left chest pain today.

EXAM:
CHEST  2 VIEW

[chest pa]
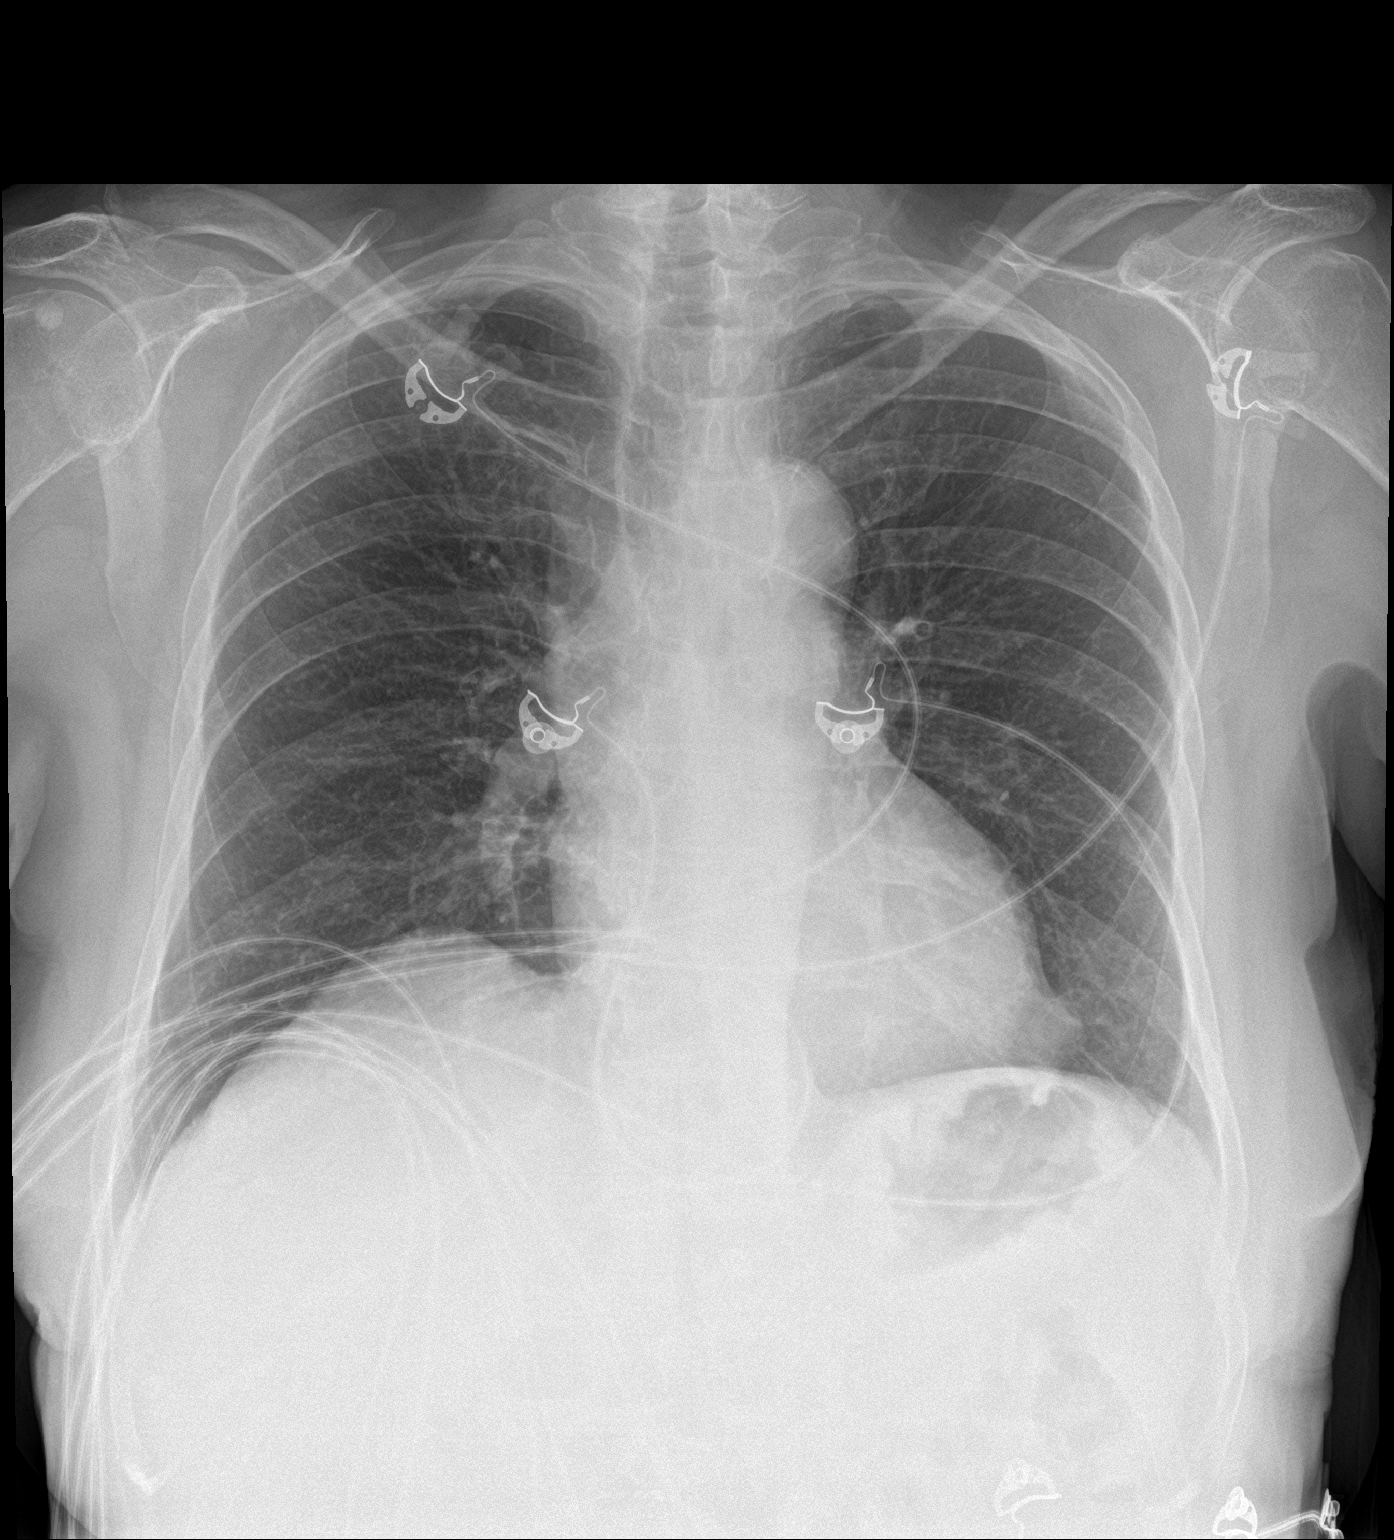

[chest lat]
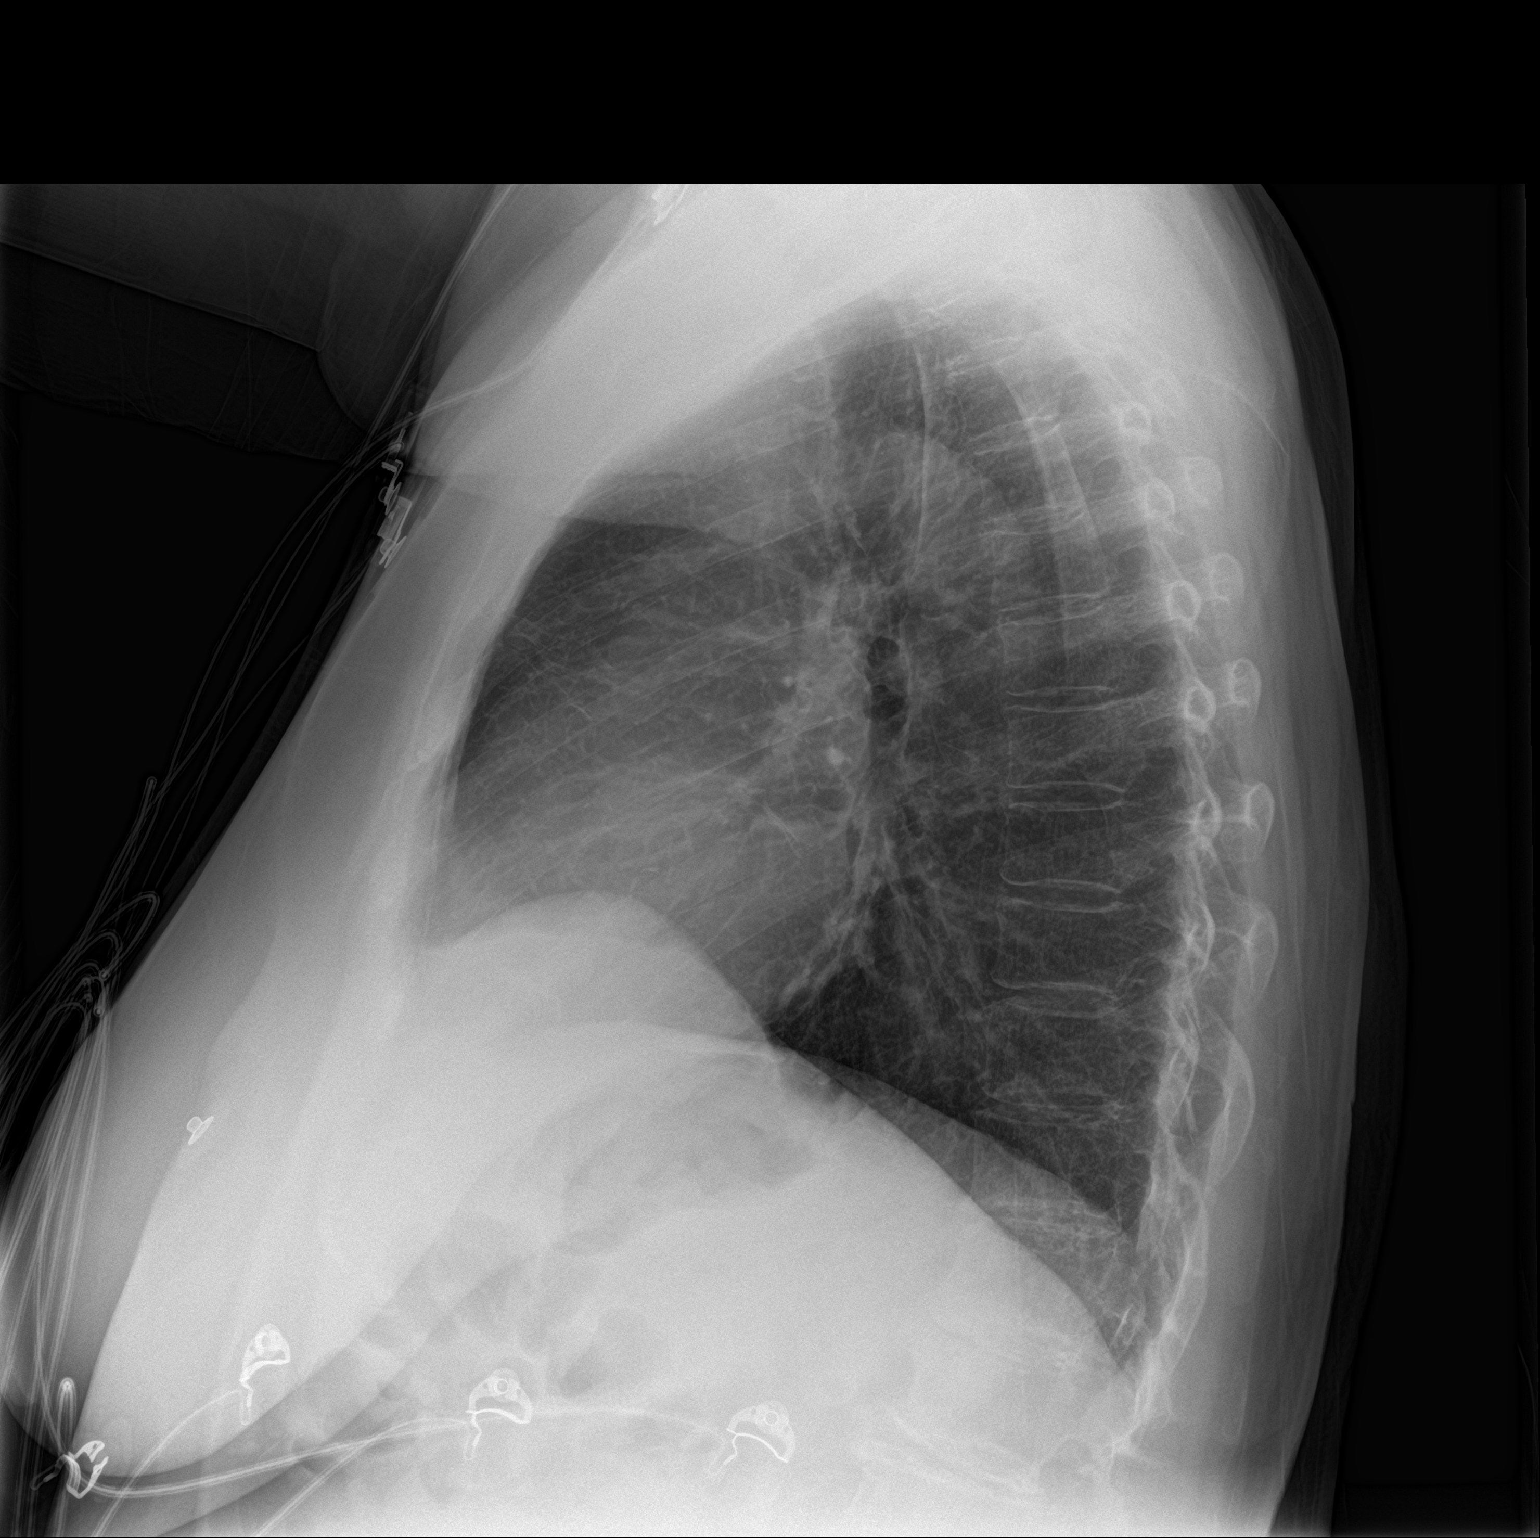

[2 of 2 positions shown; findings below may reference images not displayed]

FINDINGS: The cardiomediastinal silhouette is unremarkable.

There is no evidence of focal airspace disease, pulmonary edema,
suspicious pulmonary nodule/mass, pleural effusion, or pneumothorax.
No acute bony abnormalities are identified.
IMPRESSION: No active cardiopulmonary disease.

## 2018-10-05 NOTE — Progress Notes (Signed)
[facility]  PATIENT: Shawna Lynch DOB: 10-06-1934   REASON FOR VISIT: Mild cognitive impairment HISTORY FROM: Patient and daughter Shawna Lynch    HISTORY OF PRESENT ILLNESS: Shawna Lynch is a 84 years old female, accompanied by her daughter Shawna Lynch, seen in refer by her primary care PA for evaluation of memory loss, headache, initial evaluation was on September 03, 2017.  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, she was noted to have gradual onset memory loss since 2017, no longer able to attend her weekly get together with her friends regularly, her daughter has to take over financial part since 2017,  She also reported a history of long history of headaches, now she has different kind of headaches, pressure, symmetric, no significant light noise sensitivity,  She was started on Aricept and Namenda March 2017, tolerating the medication well, there was no significant improvement, he is also taking Zoloft 100 mg a day for depression,  Laboratory evaluation in 2018, CBC mild anemia hemoglobin of 11, BMP showed elevated creatinine 1.59  Personally reviewed CT head in November 2018, MRI of the brain 2017, generalized atrophy, supratentorium small vessel disease.  UPDATE March 09 2018:YYLaboratory evaluation in December 2018 showed normal negative vitamin B12, ESR, TSH, C-reactive protein, folic acid, RPR,  She is with her daughter at visit. She lives independently, she still drive short distance.  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  She is currently on Namzaric without side effects.  Memory score is stable she continues to live independently.  She can feed and dress and bathe herself.  She does her laundry.  Daughter checks on her frequently she continues to drive short distances and places where she knows.  She has not had any accidents.  She returns for reevaluation  REVIEW OF SYSTEMS: Full 14 system review of systems performed and notable only for those listed, all others are neg:  Constitutional: neg  Cardiovascular: neg Ear/Nose/Throat: neg  Skin: neg Eyes: neg Respiratory: neg Gastroitestinal: neg  Hematology/Lymphatic: Easy bruising Endocrine: neg Musculoskeletal:neg Allergy/Immunology: Memory loss  Neurological: neg Psychiatric: neg Sleep : neg   ALLERGIES: Allergies  Allergen Reactions  . Penicillins Hives  . Sulfa Antibiotics Rash  . Codeine Rash    HOME MEDICATIONS: Outpatient Medications Prior to Visit  Medication Sig Dispense Refill  . aspirin EC 81 MG EC tablet Take 1 tablet (81 mg total) by mouth daily. 30 tablet   . atorvastatin (LIPITOR) 80 MG tablet Take 1 tablet (80 mg total) by mouth daily at 6 PM. 90 tablet 1  . carvedilol (COREG) 25 MG tablet Take 12.5 mg by mouth 2 (two) times daily with a meal.    . cholecalciferol (VITAMIN D) 1000 units tablet Take 1,000 Units by mouth daily.    . clopidogrel (PLAVIX) 75 MG tablet Take 1 tablet (75 mg total) by mouth daily with breakfast. 90 tablet 3  . Dulaglutide (TRULICITY [place]) Inject 3.89 mg into the skin once a week.    [name] FISH OIL-BORAGE-FLAX-SAFFLOWER PO Take 1 capsule by mouth daily.    [name]  Memantine HCl-Donepezil HCl (NAMZARIC) 28-10 MG CP24 Take 1 capsule by mouth daily. 90 capsule 4  . nitroGLYCERIN (NITROSTAT) 0.4 MG SL tablet Place 1 tablet (0.4 mg total) under the tongue every 5 (five) minutes x 3 doses as needed for chest pain. 25 tablet 2  . omega-3 acid  ethyl esters (LOVAZA) 1 g capsule Take 1 capsule (1 g total) by mouth 2 (two) times daily. 180 capsule 3  . pantoprazole (PROTONIX) 40 MG tablet Take 1 tablet (40 mg total) by mouth daily. 90 tablet 0  . pioglitazone (ACTOS) 45 MG tablet Take 45 mg by mouth daily.    . sertraline (ZOLOFT) 100 MG tablet Take 100 mg by mouth daily.    . vitamin B-12 (CYANOCOBALAMIN) 1000 MCG tablet Take 1,000 mcg by mouth daily.    . isosorbide mononitrate (IMDUR) 60 MG 24 hr tablet Take 1 tablet (60 mg total) by mouth daily. 90 tablet 3  . gabapentin (NEURONTIN) 100 MG capsule Take 1 capsule (100 mg total) by mouth 3 (three) times daily. 90 capsule 11  . hydrOXYzine (ATARAX/VISTARIL) 25 MG tablet Take 25 mg by mouth 3 (three) times daily as needed.     No facility-administered medications prior to visit.     PAST MEDICAL HISTORY: Past Medical History:  Diagnosis Date  . Anxiety   . Arthritis    oa  . Coronary artery disease    04/06/17 PCI with DES to [facility], 90% in diag from LAD, nonobstructive disease in LAD, RCA. Normal EF.   [name] Dementia ([place])   . Depression   . Diabetes mellitus without complication ([place])   . Hypertension   . NSTEMI (non-ST elevated myocardial infarction) ([place])   . Ringing in ears   . TIA (transient ischemic attack)     PAST SURGICAL HISTORY: Past Surgical History:  Procedure Laterality Date  . ABDOMINAL HYSTERECTOMY    . CORONARY STENT INTERVENTION  04/06/2017  . CORONARY STENT INTERVENTION N/A 04/06/2017   Procedure: Coronary Stent Intervention;  Surgeon: Sherren Mocha, MD;  Location: [place] [facility];  Service: Cardiovascular;  Laterality: N/A;  . LEFT HEART CATH AND CORONARY ANGIOGRAPHY N/A 04/06/2017   Procedure: Left Heart Cath and Coronary Angiography;  Surgeon: Sherren Mocha, MD;  Location: [place] [facility];  Service: Cardiovascular;  Laterality: N/A;    FAMILY HISTORY: Family History  Problem Relation Age of Onset  . CAD Father   . Cancer Sister   . Hearing  loss Mother   . Hypertension Mother     SOCIAL HISTORY: Social History   Socioeconomic History  . Marital status: Married    Spouse name: Not on file  . Number of children: 2  . Years of education: 78  . Highest education level: High school graduate  Occupational History  . Occupation: Retired  Scientific laboratory technician  . Financial resource strain: Not on file  . Food insecurity:    Worry: Not on file    Inability: Not on file  . Transportation needs:    Medical: Not on file    Non-medical: Not on file  Tobacco Use  . Smoking status: Former Research scientist (life sciences)  . Smokeless tobacco: Never Used  . Tobacco comment: quit "20 years ago"  Substance and Sexual Activity  . Alcohol use: No  . Drug use: No  . Sexual activity: Not on file  Lifestyle  . Physical activity:    Days per week: Not on file    Minutes per session: Not on file  .  Stress: Not on file  Relationships  . Social connections:    Talks on phone: Not on file    Gets together: Not on file    Attends religious service: Not on file    Active member of club or organization: Not on file    Attends meetings of clubs or organizations: Not on file    Relationship status: Not on file  . Intimate partner violence:    Fear of current or ex partner: Not on file    Emotionally abused: Not on file    Physically abused: Not on file    Forced sexual activity: Not on file  Other Topics Concern  . Not on file  Social History Narrative   Lives alone but daughter is right across the street.   Right-handed.   No caffeine use.     PHYSICAL EXAM  Vitals:   10/07/18 0906  BP: (!) 171/81  Pulse: 88  Weight: 139 lb 9.6 oz (63.3 kg)  Height: '5\' 6"'  (1.676 m)   Body mass index is 22.53 kg/m.  Generalized: Well developed, in no acute distress  Head: normocephalic and atraumatic,. Oropharynx benign  Neck: Supple, Musculoskeletal: No deformity   Neurological examination   Mentation: Alert  MMSE - Mini Mental State Exam 10/07/2018 03/09/2018  09/03/2017  Not completed: (No Data) - -  Orientation to time '2 4 2  ' Orientation to Place '5 5 5  ' Registration '3 3 3  ' Attention/ Calculation '5 5 5  ' Recall 0 0 0  Language- name 2 objects '2 2 2  ' Language- repeat '1 1 1  ' Language- follow 3 step command '3 3 3  ' Language- follow 3 step command-comments She held the paper in the air once she folded it  - -  Language- read & follow direction '1 1 1  ' Write a sentence '1 1 1  ' Copy design 0 1 0  Total score '23 26 23   ' Follows all commands speech and language fluent.   Cranial nerve II-XII: Pupils were equal round reactive to light extraocular movements were full, visual field were full on confrontational test. Facial sensation and strength were normal. hearing was intact to finger rubbing bilaterally. Uvula tongue midline. head turning and shoulder shrug were normal and symmetric.Tongue protrusion into cheek strength was normal. Motor: normal bulk and tone, full strength in the BUE, BLE,  Sensory: normal and symmetric to light touch, in the upper and lower extremities Coordination: finger-nose-finger, heel-to-shin bilaterally, no dysmetria Reflexes: Symmetric upper and lower plantar responses were flexor bilaterally. Gait and Station: Rising up from seated position without assistance, normal stance,  moderate stride, good arm swing, smooth turning, able to perform tiptoe, and heel walking without difficulty. Tandem gait is mildly unsteady  DIAGNOSTIC DATA (LABS, IMAGING, TESTING) - I reviewed patient records, labs, notes, testing and imaging myself where available.  Lab Results  Component Value Date   WBC 5.5 08/18/2017   HGB 11.0 (L) 08/18/2017   HCT 34.2 (L) 08/18/2017   MCV 91.9 08/18/2017   PLT 194 08/18/2017      Component Value Date/Time   NA 141 08/18/2017 0852   NA 139 12/17/2012 1305   K 3.9 08/18/2017 0852   K 4.0 12/17/2012 1305   CL 106 08/18/2017 0852   CL 102 12/17/2012 1305   CO2 26 08/18/2017 0852   CO2 29 12/17/2012 1305    GLUCOSE 141 (H) 08/18/2017 0852   GLUCOSE 151 (H) 12/17/2012 1305   BUN 19 08/18/2017 7416  BUN 15 12/17/2012 1305   CREATININE 1.59 (H) 08/18/2017 0852   CREATININE 1.00 12/17/2012 1305   CALCIUM 9.7 08/18/2017 0852   CALCIUM 9.2 12/17/2012 1305   PROT 8.1 12/17/2012 1305   ALBUMIN 3.7 12/17/2012 1305   AST 23 12/17/2012 1305   ALT 17 12/17/2012 1305   ALKPHOS 54 12/17/2012 1305   BILITOT 0.4 12/17/2012 1305   GFRNONAA 30 (L) 08/18/2017 0852   GFRNONAA 55 (L) 12/17/2012 1305   GFRAA 34 (L) 08/18/2017 0852   GFRAA >60 12/17/2012 1305    Lab Results  Component Value Date   HGBA1C 7.7 (H) 04/03/2017   Lab Results  Component Value Date   SQZYTMMI19 471 09/03/2017   Lab Results  Component Value Date   TSH 2.510 09/03/2017      ASSESSMENT AND PLAN  Shawna Lynch is a 84 y.o. female   with mild cognitive impairment most consistent with central nervous system degenerative disorder.  MRI of the brain showed significant atrophy.  MMSE is stable Continue Namzaric 1 cap daily does not need refills Exercises to stimulate memory Follow up in 6 months Dennie Bible, [facility], [facility], APRN  [facility] [address] [place], [place] [address] [phone]

## 2018-10-07 ENCOUNTER — Ambulatory Visit: Payer: Medicare HMO | Admitting: Nurse Practitioner

## 2018-10-07 ENCOUNTER — Encounter: Payer: Self-pay | Admitting: Nurse Practitioner

## 2018-10-07 VITALS — BP 171/81 | HR 88 | Ht 66.0 in | Wt 139.6 lb

## 2018-10-07 DIAGNOSIS — G3184 Mild cognitive impairment, so stated: Secondary | ICD-10-CM

## 2018-10-07 NOTE — Progress Notes (Signed)
I have reviewed and agreed above plan. 

## 2018-10-07 NOTE — Patient Instructions (Signed)
MMSE is stable Continue Namzaric 1 cap daily  Follow up in 6 months

## 2018-12-13 DIAGNOSIS — N183 Chronic kidney disease, stage 3 (moderate): Secondary | ICD-10-CM | POA: Diagnosis not present

## 2018-12-13 DIAGNOSIS — E7800 Pure hypercholesterolemia, unspecified: Secondary | ICD-10-CM | POA: Diagnosis not present

## 2018-12-13 DIAGNOSIS — F0280 Dementia in other diseases classified elsewhere without behavioral disturbance: Secondary | ICD-10-CM | POA: Diagnosis not present

## 2018-12-13 DIAGNOSIS — I10 Essential (primary) hypertension: Secondary | ICD-10-CM | POA: Diagnosis not present

## 2018-12-13 DIAGNOSIS — E1122 Type 2 diabetes mellitus with diabetic chronic kidney disease: Secondary | ICD-10-CM | POA: Diagnosis not present

## 2018-12-13 DIAGNOSIS — D649 Anemia, unspecified: Secondary | ICD-10-CM | POA: Diagnosis not present

## 2018-12-13 DIAGNOSIS — L280 Lichen simplex chronicus: Secondary | ICD-10-CM | POA: Diagnosis not present

## 2018-12-13 DIAGNOSIS — I25118 Atherosclerotic heart disease of native coronary artery with other forms of angina pectoris: Secondary | ICD-10-CM | POA: Diagnosis not present

## 2018-12-13 DIAGNOSIS — Z6823 Body mass index (BMI) 23.0-23.9, adult: Secondary | ICD-10-CM | POA: Diagnosis not present

## 2019-01-12 ENCOUNTER — Other Ambulatory Visit: Payer: Self-pay | Admitting: Cardiology

## 2019-01-12 NOTE — Telephone Encounter (Signed)
Isosorbide and clopidogrel refilled.

## 2019-02-04 ENCOUNTER — Telehealth: Payer: Self-pay | Admitting: Cardiology

## 2019-02-04 NOTE — Telephone Encounter (Signed)
LMTCB to schedule follow up visit with Dr. Percival Spanish, called from recall list.

## 2019-02-09 DIAGNOSIS — M25531 Pain in right wrist: Secondary | ICD-10-CM | POA: Diagnosis not present

## 2019-02-09 DIAGNOSIS — S56912A Strain of unspecified muscles, fascia and tendons at forearm level, left arm, initial encounter: Secondary | ICD-10-CM | POA: Diagnosis not present

## 2019-02-19 IMAGING — CT CT HEAD W/O CM
4 of 5 series · 16 of 47 positions shown, 18 images · non-contrast
Comparison: Brain MRI November 28, 2015 and head CT December 17, 2012

CLINICAL DATA: Dementia.  Non pulsatile tinnitus.

EXAM:
CT HEAD WITHOUT CONTRAST
TECHNIQUE: Contiguous axial images were obtained from the base of the skull
through the vertex without intravenous contrast.

[Series 2: head wo f_0.5 · axial · 0.44mm/px · z∈[+1324,+1449]mm · 8 of 33 slices shown, 10 images]
[im 4/33  brain]
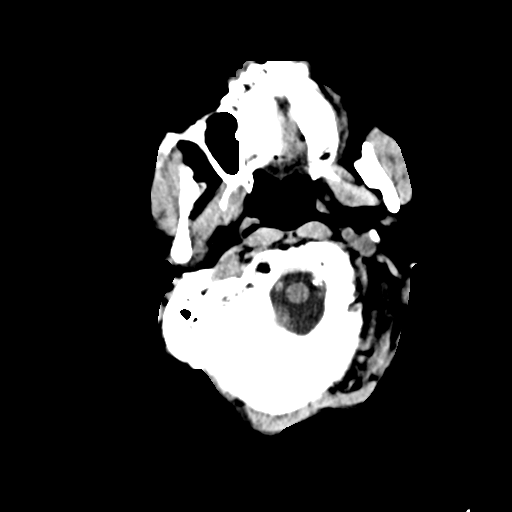
[im 4/33  bone]
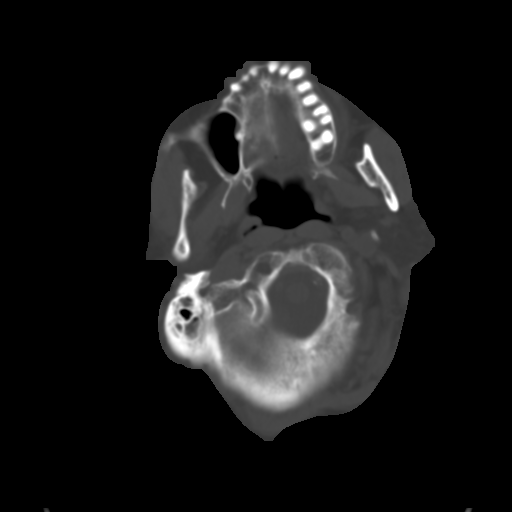
[im 8/33  brain]
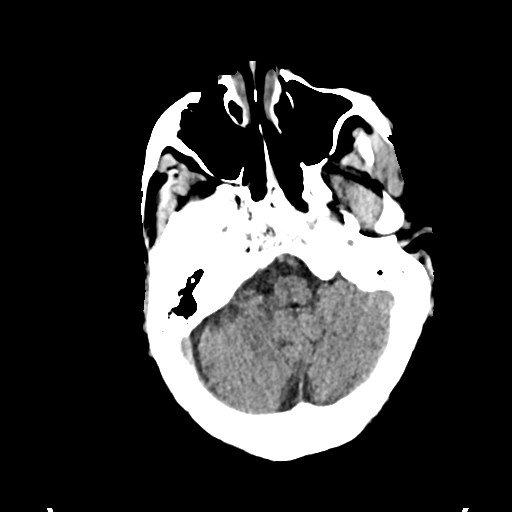
[im 11/33  brain]
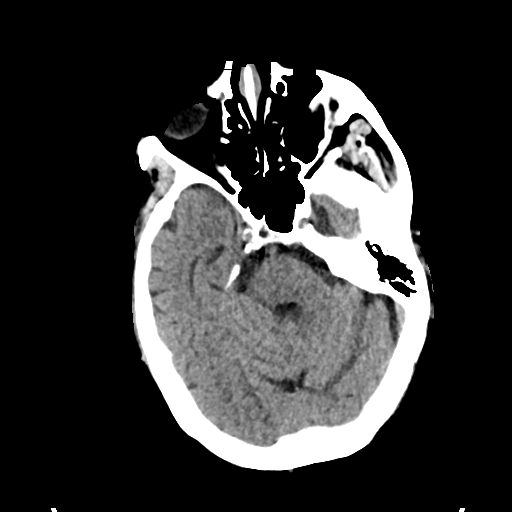
[im 15/33  brain]
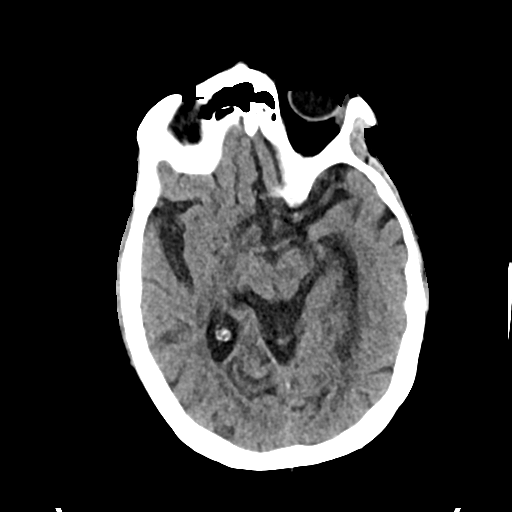
[im 18/33  brain]
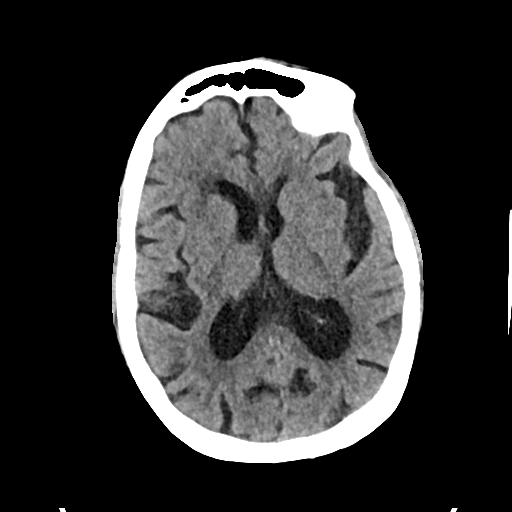
[im 18/33  bone]
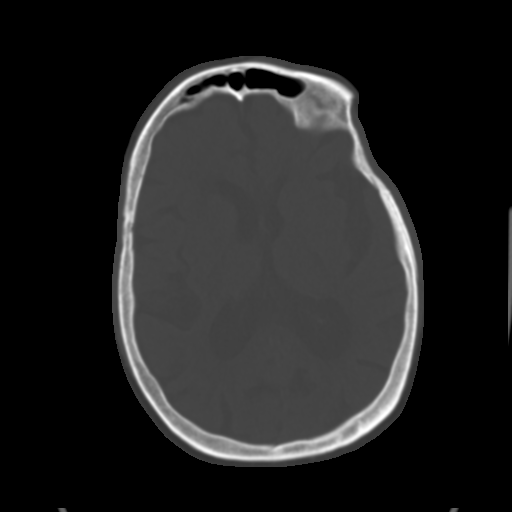
[im 22/33  brain]
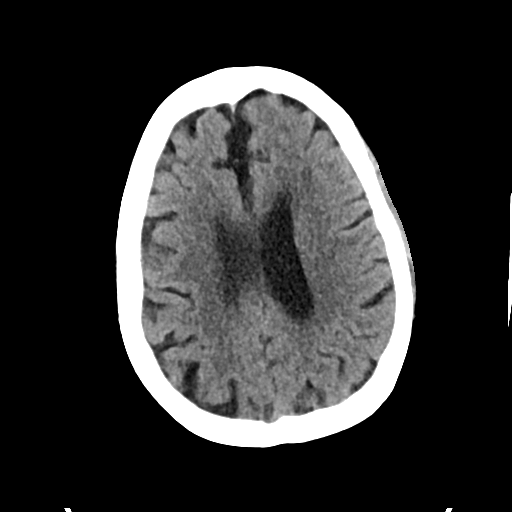
[im 25/33  brain]
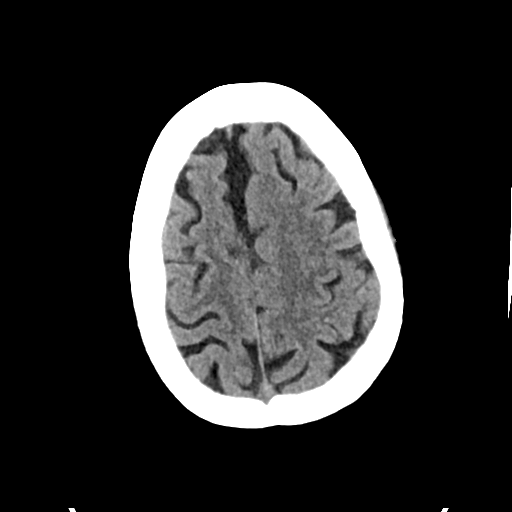
[im 29/33  brain]
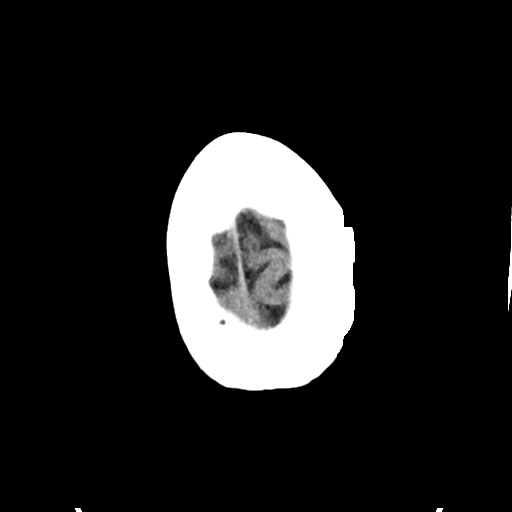

[Series 3: head bone a_80kv · axial · 0.44mm/px · z∈[+1324,+1344]mm · 2 of 33 slices shown]
[im 4/33  bone]
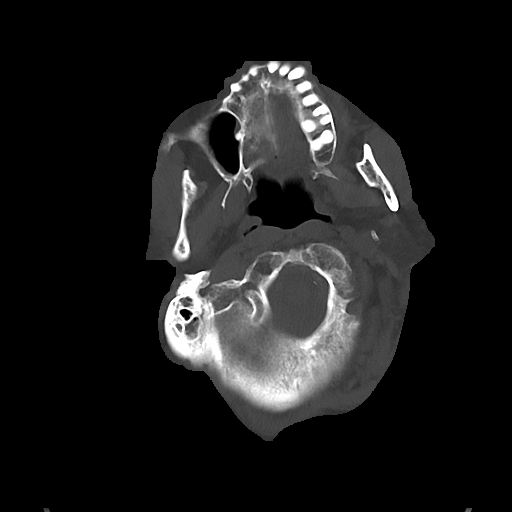
[im 8/33  bone]
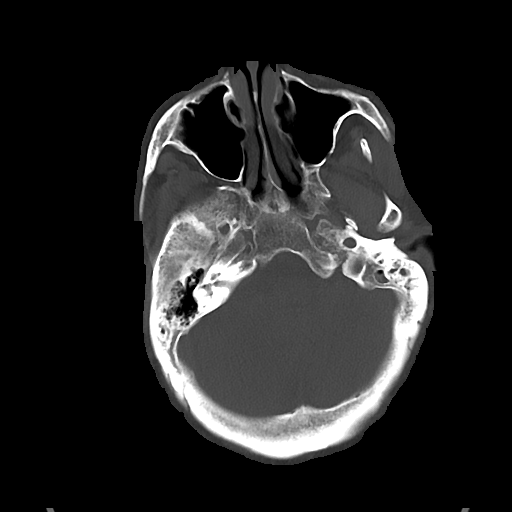

[Series 8: cor soft f_0.5 · coronal · 0.32mm/px · 3 of 69 slices shown]
[im 23/69  brain]
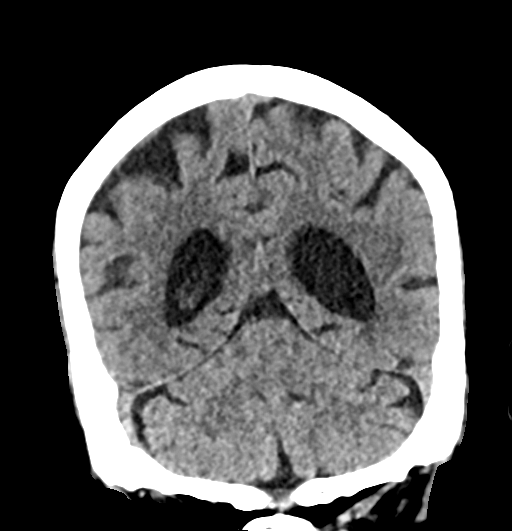
[im 31/69  brain]
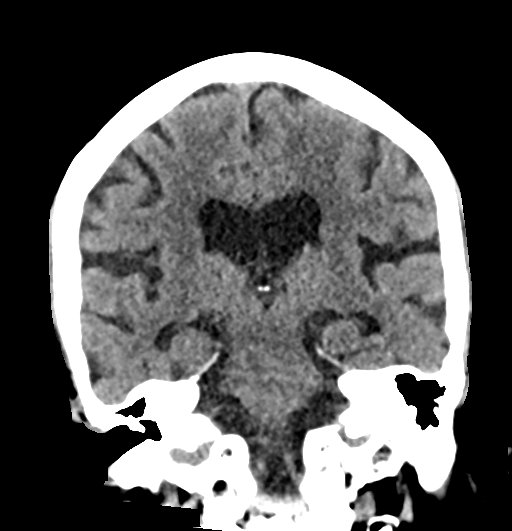
[im 38/69  brain]
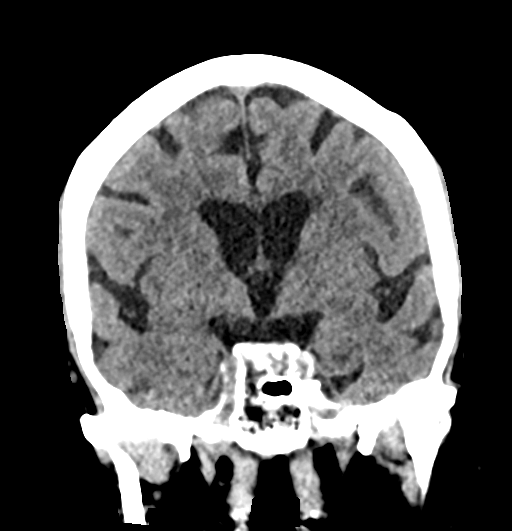

[Series 9: sag soft f_0.5 · sagittal · 0.35mm/px · 3 of 55 slices shown]
[im 19/55  brain]
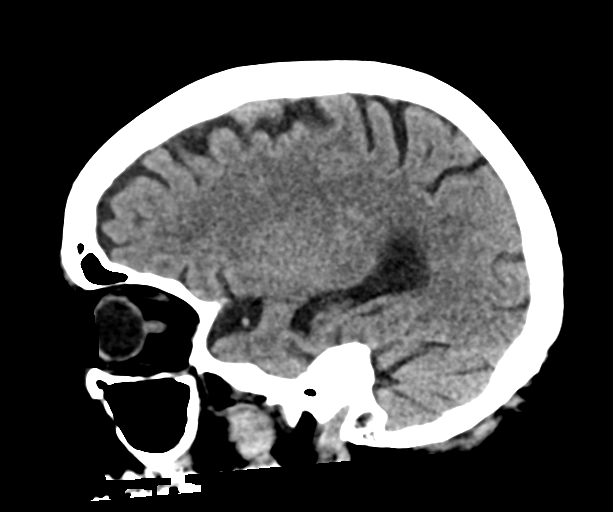
[im 28/55  brain]
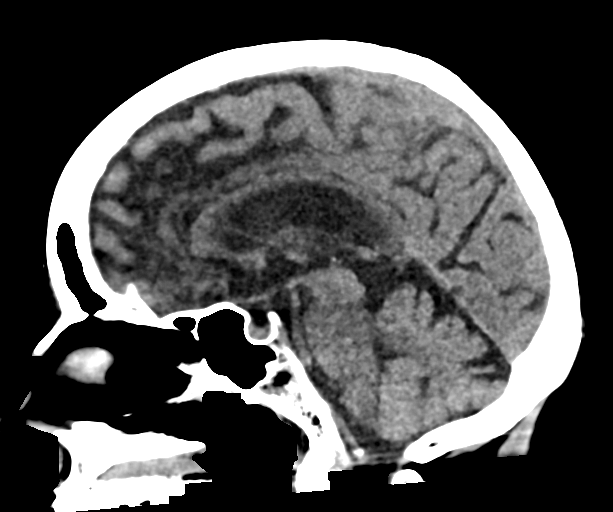
[im 37/55  brain]
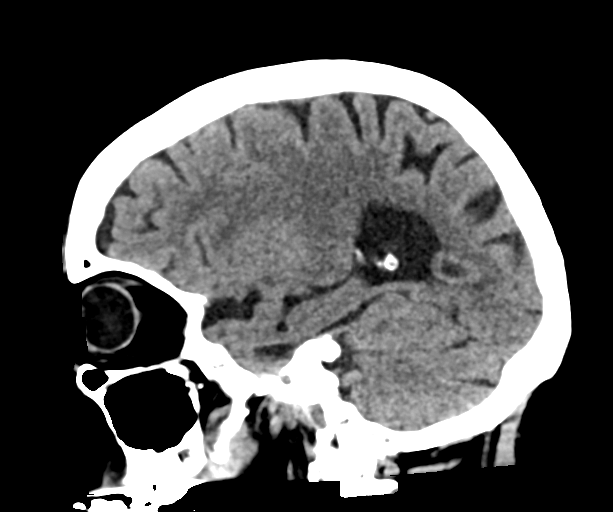

[16 of 47 positions shown; findings below may reference images not displayed]

FINDINGS: Brain: There is mild diffuse atrophy. There is no intracranial mass,
hemorrhage, extra-axial fluid collection, or midline shift. There is
patchy small vessel disease in the centra semiovale bilaterally.
There are prior lacunar type infarcts in the anterior limbs of the
external and internal capsules on the left as well as in the right
internal capsule anteriorly. There is small vessel disease in each
lateral thalamus region. No acute infarct is demonstrable on this
study.

Vascular: No hyperdense vessel appreciable. There is calcification
in each carotid siphon region.

Skull: Bony calvarium appears intact.

Sinuses/Orbits: Mucosal thickening is noted in several ethmoid air
cells bilaterally. Other paranasal sinuses are clear. Orbits appear
symmetric bilaterally.

Other: There is inferior mastoid disease on both sides without
air-fluid level. More superior mastoids appear clear bilaterally.
IMPRESSION: Atrophy with supratentorial small vessel disease. No acute infarct
evident. No mass, hemorrhage, or extra-axial fluid collection.

Foci of arterial vascular calcification noted. Mucosal thickening
noted in several ethmoid air cells. Inferior mastoid disease
bilaterally.

## 2019-03-29 DIAGNOSIS — E1122 Type 2 diabetes mellitus with diabetic chronic kidney disease: Secondary | ICD-10-CM | POA: Diagnosis not present

## 2019-03-29 DIAGNOSIS — E7800 Pure hypercholesterolemia, unspecified: Secondary | ICD-10-CM | POA: Diagnosis not present

## 2019-03-29 DIAGNOSIS — D649 Anemia, unspecified: Secondary | ICD-10-CM | POA: Diagnosis not present

## 2019-03-29 DIAGNOSIS — Z6823 Body mass index (BMI) 23.0-23.9, adult: Secondary | ICD-10-CM | POA: Diagnosis not present

## 2019-03-29 DIAGNOSIS — F0280 Dementia in other diseases classified elsewhere without behavioral disturbance: Secondary | ICD-10-CM | POA: Diagnosis not present

## 2019-03-29 DIAGNOSIS — N183 Chronic kidney disease, stage 3 (moderate): Secondary | ICD-10-CM | POA: Diagnosis not present

## 2019-03-29 DIAGNOSIS — I10 Essential (primary) hypertension: Secondary | ICD-10-CM | POA: Diagnosis not present

## 2019-03-29 DIAGNOSIS — I25118 Atherosclerotic heart disease of native coronary artery with other forms of angina pectoris: Secondary | ICD-10-CM | POA: Diagnosis not present

## 2019-03-29 DIAGNOSIS — L280 Lichen simplex chronicus: Secondary | ICD-10-CM | POA: Diagnosis not present

## 2019-03-30 ENCOUNTER — Telehealth: Payer: Self-pay | Admitting: *Deleted

## 2019-03-30 NOTE — Telephone Encounter (Signed)
Mrs. Shawna Lynch, will have her daughter call to schedule her appointment.

## 2019-04-06 NOTE — Progress Notes (Signed)
PATIENT: Shawna Lynch DOB: 18-Sep-1935  REASON FOR VISIT: follow up HISTORY FROM: patient  HISTORY OF PRESENT ILLNESS: Today 04/07/19  HISTORY  Shawna Demartini Wrightis a 84 years old female, accompanied by her daughter Shawna Lynch, seen in refer by her primary care PA for evaluation of memory loss, headache, initial evaluation was on September 03, 2017.  I reviewed and summarized the referring note, she has past medical history of hypertension, diabetes, hyperlipidemia, coronary artery disease, memory loss, taking Namendaxr28 mg, donepezil 10 mg, since March 2018,  Retired from Psychologist, educational job, lives across the street from her daughter by herself, she was noted to have gradual onset memory loss since 2017, no longer able to attend her weekly get together with her friends regularly, her daughter has to take over financial part since 2017,  She also reported a history of long history of headaches, now she has different kind of headaches, pressure, symmetric, no significant light noise sensitivity,  She was started on Aricept and Namenda March 2017, tolerating the medication well, there was no significant improvement, he is also taking Zoloft 100 mg a day for depression,  Laboratory evaluation in 2018, CBC mild anemia hemoglobin of 11, BMP showed elevated creatinine 1.59  Personally reviewed CT head in November 2018, MRI of the brain 2017, generalized atrophy, supratentorium small vessel disease.  UPDATE March 09 2018:YYLaboratory evaluation in December 2018 showed normal negative vitamin B12, ESR, TSH, C-reactive protein, folic acid, RPR,  She is with her daughter at visit. 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  She is currently on Namzaric without side effects.  Memory score is stable she continues to live independently.  She can feed and dress and bathe herself.  She does her laundry.  Daughter checks on her frequently she continues to drive short distances and places where she knows.  She has not had any accidents.  She returns for reevaluation  Update April 07, 2019 Shawna Lynch: Last MMSE was 23/30, she remains on Namzaric daily. She thinks her memory is stable. She lives alone. She is able to do her ADLs. Daughter is across the street. She drives a car okay, only short distances. Needs help with housework. She likes to eat out, not much cooking. She has a good appetite. Her daughter puts her medications in pill box. Overall health has been good. She lives to watch TV, crossword puzzles, reads the paper. She has been depressed, her medication was adjusted, switched from Zoloft to Lexapro. She did have a fall, sprain to left wrist, wears a brace.     REVIEW OF SYSTEMS: Out of a complete 14 system review of symptoms, the patient complains only of the following symptoms, and all other reviewed systems are negative.  Memory loss  ALLERGIES: Allergies  Allergen Reactions  . Penicillins Hives  . Sulfa Antibiotics Rash  . Codeine Rash    HOME MEDICATIONS: Outpatient Medications Prior to Visit  Medication Sig Dispense Refill  . aspirin EC 81 MG EC tablet Take 1 tablet (81 mg total) by mouth daily. 30 tablet   . atorvastatin (LIPITOR) 80 MG tablet Take 1 tablet (80 mg total) by mouth daily at 6 PM. 90 tablet 1  . carvedilol (COREG) 25 MG tablet Take 12.5 mg by  mouth 2 (two) times daily with a meal.    . cholecalciferol (VITAMIN D) 1000 units tablet Take 1,000 Units by mouth daily.    . clopidogrel (PLAVIX) 75 MG tablet TAKE 1 TABLET EVERY DAY WITH BREAKFAST 90 tablet 1  . Dulaglutide (TRULICITY [place]) Inject 1.75 mg into the skin once a week.    . escitalopram (LEXAPRO) 10 MG tablet Take  10 mg by mouth daily.    [name] FISH OIL-BORAGE-FLAX-SAFFLOWER PO Take 1 capsule by mouth daily.    . isosorbide mononitrate (IMDUR) 60 MG 24 hr tablet TAKE 1 TABLET EVERY DAY 90 tablet 1  . nitroGLYCERIN (NITROSTAT) 0.4 MG SL tablet Place 1 tablet (0.4 mg total) under the tongue every 5 (five) minutes x 3 doses as needed for chest pain. 25 tablet 2  . omega-3 acid ethyl esters (LOVAZA) 1 g capsule Take 1 capsule (1 g total) by mouth 2 (two) times daily. 180 capsule 3  . pantoprazole (PROTONIX) 40 MG tablet Take 1 tablet (40 mg total) by mouth daily. 90 tablet 0  . pioglitazone (ACTOS) 45 MG tablet Take 45 mg by mouth daily.    . vitamin B-12 (CYANOCOBALAMIN) 1000 MCG tablet Take 1,000 mcg by mouth daily.    . Memantine HCl-Donepezil HCl (NAMZARIC) 28-10 MG CP24 Take 1 capsule by mouth daily. 90 capsule 4  . sertraline (ZOLOFT) 100 MG tablet Take 100 mg by mouth daily.     No facility-administered medications prior to visit.     PAST MEDICAL HISTORY: Past Medical History:  Diagnosis Date  . Anxiety   . Arthritis    oa  . Coronary artery disease    04/06/17 PCI with DES to [facility], 90% in diag from LAD, nonobstructive disease in LAD, RCA. Normal EF.   [name] Dementia ([place])   . Depression   . Diabetes mellitus without complication ([place])   . Hypertension   . NSTEMI (non-ST elevated myocardial infarction) ([place])   . Ringing in ears   . TIA (transient ischemic attack)     PAST SURGICAL HISTORY: Past Surgical History:  Procedure Laterality Date  . ABDOMINAL HYSTERECTOMY    . CORONARY STENT INTERVENTION  04/06/2017  . CORONARY STENT INTERVENTION N/A 04/06/2017   Procedure: Coronary Stent Intervention;  Surgeon: Sherren Mocha, MD;  Location: [place] [facility];  Service: Cardiovascular;  Laterality: N/A;  . LEFT HEART CATH AND CORONARY ANGIOGRAPHY N/A 04/06/2017   Procedure: Left Heart Cath and Coronary Angiography;  Surgeon: Sherren Mocha, MD;  Location: [place] [facility];  Service: Cardiovascular;   Laterality: N/A;    FAMILY HISTORY: Family History  Problem Relation Age of Onset  . CAD Father   . Cancer Sister   . Hearing loss Mother   . Hypertension Mother     SOCIAL HISTORY: Social History   Socioeconomic History  . Marital status: Married    Spouse name: Not on file  . Number of children: 2  . Years of education: 89  . Highest education level: High school graduate  Occupational History  . Occupation: Retired  Scientific laboratory technician  . Financial resource strain: Not on file  . Food insecurity    Worry: Not on file    Inability: Not on file  . Transportation needs    Medical: Not on file    Non-medical: Not on file  Tobacco Use  . Smoking status: Former Research scientist (life sciences)  . Smokeless tobacco: Never Used  . Tobacco comment: quit "20 years ago"  Substance and Sexual  Activity  . Alcohol use: No  . Drug use: No  . Sexual activity: Not on file  Lifestyle  . Physical activity    Days per week: Not on file    Minutes per session: Not on file  . Stress: Not on file  Relationships  . Social Herbalist on phone: Not on file    Gets together: Not on file    Attends religious service: Not on file    Active member of club or organization: Not on file    Attends meetings of clubs or organizations: Not on file    Relationship status: Not on file  . Intimate partner violence    Fear of current or ex partner: Not on file    Emotionally abused: Not on file    Physically abused: Not on file    Forced sexual activity: Not on file  Other Topics Concern  . Not on file  Social History Narrative   Lives alone but daughter is right across the street.   Right-handed.   No caffeine use.      PHYSICAL EXAM  Vitals:   04/07/19 1029  BP: 129/84  Pulse: 91  Temp: 97.7 F (36.5 C)  Weight: 114 lb (51.7 kg)  Height: '5\' 6"'  (1.676 m)   Body mass index is 18.4 kg/m.  Generalized: Well developed, in no acute distress  MMSE - Mini Mental State Exam 04/07/2019 10/07/2018 03/09/2018   Not completed: - (No Data) -  Orientation to time '3 2 4  ' Orientation to Place '4 5 5  ' Registration '3 3 3  ' Attention/ Calculation '5 5 5  ' Recall 0 0 0  Language- name 2 objects '2 2 2  ' Language- repeat '1 1 1  ' Language- follow 3 step command '3 3 3  ' Language- follow 3 step command-comments - She held the paper in the air once she folded it  -  Language- read & follow direction '1 1 1  ' Write a sentence '1 1 1  ' Copy design 1 0 1  Copy design-comments 8 - -  Total score '24 23 26    ' Neurological examination  Mentation: Alert oriented to time, place, history taking, daughter provides assistance with history. Follows all commands speech and language fluent Cranial nerve II-XII: Pupils were equal round reactive to light. Extraocular movements were full, visual field were full on confrontational test. Facial sensation and strength were normal. Uvula tongue midline. Head turning and shoulder shrug  were normal and symmetric. Motor: The motor testing reveals 5 over 5 strength of all 4 extremities. Good symmetric motor tone is noted throughout.  Sensory: Sensory testing is intact to soft touch on all 4 extremities. No evidence of extinction is noted.  Coordination: Cerebellar testing reveals good finger-nose-finger and heel-to-shin bilaterally.  Gait and station: Gait is normal. Tandem gait is normal.  Reflexes: Deep tendon reflexes are symmetric and normal bilaterally.   DIAGNOSTIC DATA (LABS, IMAGING, TESTING) - I reviewed patient records, labs, notes, testing and imaging myself where available.  Lab Results  Component Value Date   WBC 5.5 08/18/2017   HGB 11.0 (L) 08/18/2017   HCT 34.2 (L) 08/18/2017   MCV 91.9 08/18/2017   PLT 194 08/18/2017      Component Value Date/Time   NA 141 08/18/2017 0852   NA 139 12/17/2012 1305   K 3.9 08/18/2017 0852   K 4.0 12/17/2012 1305   CL 106 08/18/2017 0852   CL 102 12/17/2012 1305  CO2 26 08/18/2017 0852   CO2 29 12/17/2012 1305   GLUCOSE 141 (H)  08/18/2017 0852   GLUCOSE 151 (H) 12/17/2012 1305   BUN 19 08/18/2017 0852   BUN 15 12/17/2012 1305   CREATININE 1.59 (H) 08/18/2017 0852   CREATININE 1.00 12/17/2012 1305   CALCIUM 9.7 08/18/2017 0852   CALCIUM 9.2 12/17/2012 1305   PROT 8.1 12/17/2012 1305   ALBUMIN 3.7 12/17/2012 1305   AST 23 12/17/2012 1305   ALT 17 12/17/2012 1305   ALKPHOS 54 12/17/2012 1305   BILITOT 0.4 12/17/2012 1305   GFRNONAA 30 (L) 08/18/2017 0852   GFRNONAA 55 (L) 12/17/2012 1305   GFRAA 34 (L) 08/18/2017 0852   GFRAA >60 12/17/2012 1305   No results found for: CHOL, HDL, LDLCALC, LDLDIRECT, TRIG, CHOLHDL Lab Results  Component Value Date   HGBA1C 7.7 (H) 04/03/2017   Lab Results  Component Value Date   ESLPNPYY51 102 09/03/2017   Lab Results  Component Value Date   TSH 2.510 09/03/2017      ASSESSMENT AND PLAN 84 y.o. year old female  has a past medical history of Anxiety, Arthritis, Coronary artery disease, Dementia ([place]), Depression, Diabetes mellitus without complication ([place]), Hypertension, NSTEMI (non-ST elevated myocardial infarction) ([place]), Ringing in ears, and TIA (transient ischemic attack). here with:  1. Memory loss  -MMSE 24/30 today, stable -Most consistent with central nervous system degenerative disorder -MRI of the brain showed significant atrophy -We will continue Namzaric daily, I have sent a refill -Encouraged exercise, drinking plenty of water -Follow-up in 6 months or sooner if needed    I spent 15 minutes with the patient. 50% of this time was spent discussing her plan of care   Evangeline Dakin, DNP 04/07/2019, 10:48 AM [facility] [address] [place], [place] [address] [phone]

## 2019-04-07 ENCOUNTER — Ambulatory Visit (INDEPENDENT_AMBULATORY_CARE_PROVIDER_SITE_OTHER): Payer: Medicare HMO | Admitting: Neurology

## 2019-04-07 ENCOUNTER — Encounter: Payer: Self-pay | Admitting: Neurology

## 2019-04-07 ENCOUNTER — Other Ambulatory Visit: Payer: Self-pay

## 2019-04-07 VITALS — BP 129/84 | HR 91 | Temp 97.7°F | Ht 66.0 in | Wt 114.0 lb

## 2019-04-07 DIAGNOSIS — G3184 Mild cognitive impairment, so stated: Secondary | ICD-10-CM | POA: Diagnosis not present

## 2019-04-07 MED ORDER — NAMZARIC 28-10 MG PO CP24: 1.0000 | ORAL_CAPSULE | Freq: Every day | ORAL | 4 refills | Status: DC

## 2019-04-07 NOTE — Patient Instructions (Signed)
You look great today! It was a pleasure to meet you both!

## 2019-04-11 NOTE — Progress Notes (Signed)
I have reviewed and agreed above plan. 

## 2019-04-29 DIAGNOSIS — I10 Essential (primary) hypertension: Secondary | ICD-10-CM | POA: Diagnosis not present

## 2019-04-29 DIAGNOSIS — T1490XA Injury, unspecified, initial encounter: Secondary | ICD-10-CM | POA: Diagnosis not present

## 2019-04-29 DIAGNOSIS — Z743 Need for continuous supervision: Secondary | ICD-10-CM | POA: Diagnosis not present

## 2019-04-29 DIAGNOSIS — M542 Cervicalgia: Secondary | ICD-10-CM | POA: Diagnosis not present

## 2019-04-29 DIAGNOSIS — M25531 Pain in right wrist: Secondary | ICD-10-CM | POA: Diagnosis not present

## 2019-04-29 DIAGNOSIS — S52612A Displaced fracture of left ulna styloid process, initial encounter for closed fracture: Secondary | ICD-10-CM | POA: Diagnosis not present

## 2019-04-29 DIAGNOSIS — R102 Pelvic and perineal pain: Secondary | ICD-10-CM | POA: Diagnosis not present

## 2019-04-29 DIAGNOSIS — Z23 Encounter for immunization: Secondary | ICD-10-CM | POA: Diagnosis not present

## 2019-04-29 DIAGNOSIS — Z7982 Long term (current) use of aspirin: Secondary | ICD-10-CM | POA: Diagnosis not present

## 2019-04-29 DIAGNOSIS — S52592A Other fractures of lower end of left radius, initial encounter for closed fracture: Secondary | ICD-10-CM | POA: Diagnosis not present

## 2019-04-29 DIAGNOSIS — Z87891 Personal history of nicotine dependence: Secondary | ICD-10-CM | POA: Diagnosis not present

## 2019-04-29 DIAGNOSIS — S52502A Unspecified fracture of the lower end of left radius, initial encounter for closed fracture: Secondary | ICD-10-CM | POA: Diagnosis not present

## 2019-04-29 DIAGNOSIS — S01112A Laceration without foreign body of left eyelid and periocular area, initial encounter: Secondary | ICD-10-CM | POA: Diagnosis not present

## 2019-04-29 DIAGNOSIS — J984 Other disorders of lung: Secondary | ICD-10-CM | POA: Diagnosis not present

## 2019-04-29 DIAGNOSIS — S5292XA Unspecified fracture of left forearm, initial encounter for closed fracture: Secondary | ICD-10-CM | POA: Diagnosis not present

## 2019-04-29 DIAGNOSIS — F0390 Unspecified dementia without behavioral disturbance: Secondary | ICD-10-CM | POA: Diagnosis not present

## 2019-04-29 DIAGNOSIS — W19XXXA Unspecified fall, initial encounter: Secondary | ICD-10-CM | POA: Diagnosis not present

## 2019-04-29 DIAGNOSIS — S0181XA Laceration without foreign body of other part of head, initial encounter: Secondary | ICD-10-CM | POA: Diagnosis not present

## 2019-04-29 DIAGNOSIS — R51 Headache: Secondary | ICD-10-CM | POA: Diagnosis not present

## 2019-04-29 DIAGNOSIS — I252 Old myocardial infarction: Secondary | ICD-10-CM | POA: Diagnosis not present

## 2019-05-02 DIAGNOSIS — S52532A Colles' fracture of left radius, initial encounter for closed fracture: Secondary | ICD-10-CM | POA: Diagnosis not present

## 2019-05-05 DIAGNOSIS — S01112S Laceration without foreign body of left eyelid and periocular area, sequela: Secondary | ICD-10-CM | POA: Diagnosis not present

## 2019-05-05 DIAGNOSIS — Z6824 Body mass index (BMI) 24.0-24.9, adult: Secondary | ICD-10-CM | POA: Diagnosis not present

## 2019-05-05 DIAGNOSIS — R296 Repeated falls: Secondary | ICD-10-CM | POA: Diagnosis not present

## 2019-05-12 DIAGNOSIS — S52532A Colles' fracture of left radius, initial encounter for closed fracture: Secondary | ICD-10-CM | POA: Diagnosis not present

## 2019-05-27 DIAGNOSIS — S52532A Colles' fracture of left radius, initial encounter for closed fracture: Secondary | ICD-10-CM | POA: Diagnosis not present

## 2019-06-02 DIAGNOSIS — N183 Chronic kidney disease, stage 3 (moderate): Secondary | ICD-10-CM | POA: Diagnosis not present

## 2019-06-02 DIAGNOSIS — E1122 Type 2 diabetes mellitus with diabetic chronic kidney disease: Secondary | ICD-10-CM | POA: Diagnosis not present

## 2019-06-02 DIAGNOSIS — R102 Pelvic and perineal pain: Secondary | ICD-10-CM | POA: Diagnosis not present

## 2019-06-02 DIAGNOSIS — Z9181 History of falling: Secondary | ICD-10-CM | POA: Diagnosis not present

## 2019-06-02 DIAGNOSIS — M5126 Other intervertebral disc displacement, lumbar region: Secondary | ICD-10-CM | POA: Diagnosis not present

## 2019-06-02 DIAGNOSIS — M5136 Other intervertebral disc degeneration, lumbar region: Secondary | ICD-10-CM | POA: Diagnosis not present

## 2019-06-02 DIAGNOSIS — Z6823 Body mass index (BMI) 23.0-23.9, adult: Secondary | ICD-10-CM | POA: Diagnosis not present

## 2019-06-02 DIAGNOSIS — M25552 Pain in left hip: Secondary | ICD-10-CM | POA: Diagnosis not present

## 2019-06-02 DIAGNOSIS — M4316 Spondylolisthesis, lumbar region: Secondary | ICD-10-CM | POA: Diagnosis not present

## 2019-06-02 DIAGNOSIS — R296 Repeated falls: Secondary | ICD-10-CM | POA: Diagnosis not present

## 2019-06-02 DIAGNOSIS — M47816 Spondylosis without myelopathy or radiculopathy, lumbar region: Secondary | ICD-10-CM | POA: Diagnosis not present

## 2019-06-12 DIAGNOSIS — N1831 Chronic kidney disease, stage 3a: Secondary | ICD-10-CM | POA: Diagnosis not present

## 2019-06-12 DIAGNOSIS — I1310 Hypertensive heart and chronic kidney disease without heart failure, with stage 1 through stage 4 chronic kidney disease, or unspecified chronic kidney disease: Secondary | ICD-10-CM | POA: Diagnosis not present

## 2019-06-12 DIAGNOSIS — F0280 Dementia in other diseases classified elsewhere without behavioral disturbance: Secondary | ICD-10-CM | POA: Diagnosis not present

## 2019-06-12 DIAGNOSIS — D631 Anemia in chronic kidney disease: Secondary | ICD-10-CM | POA: Diagnosis not present

## 2019-06-12 DIAGNOSIS — N183 Chronic kidney disease, stage 3 (moderate): Secondary | ICD-10-CM | POA: Diagnosis not present

## 2019-06-12 DIAGNOSIS — F419 Anxiety disorder, unspecified: Secondary | ICD-10-CM | POA: Diagnosis not present

## 2019-06-12 DIAGNOSIS — E1122 Type 2 diabetes mellitus with diabetic chronic kidney disease: Secondary | ICD-10-CM | POA: Diagnosis not present

## 2019-06-12 DIAGNOSIS — M810 Age-related osteoporosis without current pathological fracture: Secondary | ICD-10-CM | POA: Diagnosis not present

## 2019-06-12 DIAGNOSIS — K219 Gastro-esophageal reflux disease without esophagitis: Secondary | ICD-10-CM | POA: Diagnosis not present

## 2019-06-13 DIAGNOSIS — S52532A Colles' fracture of left radius, initial encounter for closed fracture: Secondary | ICD-10-CM | POA: Diagnosis not present

## 2019-06-14 DIAGNOSIS — D631 Anemia in chronic kidney disease: Secondary | ICD-10-CM | POA: Diagnosis not present

## 2019-06-14 DIAGNOSIS — F0280 Dementia in other diseases classified elsewhere without behavioral disturbance: Secondary | ICD-10-CM | POA: Diagnosis not present

## 2019-06-14 DIAGNOSIS — I1310 Hypertensive heart and chronic kidney disease without heart failure, with stage 1 through stage 4 chronic kidney disease, or unspecified chronic kidney disease: Secondary | ICD-10-CM | POA: Diagnosis not present

## 2019-06-14 DIAGNOSIS — E1122 Type 2 diabetes mellitus with diabetic chronic kidney disease: Secondary | ICD-10-CM | POA: Diagnosis not present

## 2019-06-14 DIAGNOSIS — K219 Gastro-esophageal reflux disease without esophagitis: Secondary | ICD-10-CM | POA: Diagnosis not present

## 2019-06-14 DIAGNOSIS — F419 Anxiety disorder, unspecified: Secondary | ICD-10-CM | POA: Diagnosis not present

## 2019-06-14 DIAGNOSIS — N1831 Chronic kidney disease, stage 3a: Secondary | ICD-10-CM | POA: Diagnosis not present

## 2019-06-14 DIAGNOSIS — M810 Age-related osteoporosis without current pathological fracture: Secondary | ICD-10-CM | POA: Diagnosis not present

## 2019-06-16 DIAGNOSIS — I1310 Hypertensive heart and chronic kidney disease without heart failure, with stage 1 through stage 4 chronic kidney disease, or unspecified chronic kidney disease: Secondary | ICD-10-CM | POA: Diagnosis not present

## 2019-06-16 DIAGNOSIS — F419 Anxiety disorder, unspecified: Secondary | ICD-10-CM | POA: Diagnosis not present

## 2019-06-16 DIAGNOSIS — Z9181 History of falling: Secondary | ICD-10-CM | POA: Diagnosis not present

## 2019-06-16 DIAGNOSIS — D631 Anemia in chronic kidney disease: Secondary | ICD-10-CM | POA: Diagnosis not present

## 2019-06-16 DIAGNOSIS — Z1331 Encounter for screening for depression: Secondary | ICD-10-CM | POA: Diagnosis not present

## 2019-06-16 DIAGNOSIS — E1122 Type 2 diabetes mellitus with diabetic chronic kidney disease: Secondary | ICD-10-CM | POA: Diagnosis not present

## 2019-06-16 DIAGNOSIS — Z0000 Encounter for general adult medical examination without abnormal findings: Secondary | ICD-10-CM | POA: Diagnosis not present

## 2019-06-16 DIAGNOSIS — E785 Hyperlipidemia, unspecified: Secondary | ICD-10-CM | POA: Diagnosis not present

## 2019-06-16 DIAGNOSIS — N1831 Chronic kidney disease, stage 3a: Secondary | ICD-10-CM | POA: Diagnosis not present

## 2019-06-16 DIAGNOSIS — M810 Age-related osteoporosis without current pathological fracture: Secondary | ICD-10-CM | POA: Diagnosis not present

## 2019-06-16 DIAGNOSIS — F0280 Dementia in other diseases classified elsewhere without behavioral disturbance: Secondary | ICD-10-CM | POA: Diagnosis not present

## 2019-06-16 DIAGNOSIS — K219 Gastro-esophageal reflux disease without esophagitis: Secondary | ICD-10-CM | POA: Diagnosis not present

## 2019-06-17 DIAGNOSIS — E1122 Type 2 diabetes mellitus with diabetic chronic kidney disease: Secondary | ICD-10-CM | POA: Diagnosis not present

## 2019-06-17 DIAGNOSIS — M810 Age-related osteoporosis without current pathological fracture: Secondary | ICD-10-CM | POA: Diagnosis not present

## 2019-06-17 DIAGNOSIS — F0280 Dementia in other diseases classified elsewhere without behavioral disturbance: Secondary | ICD-10-CM | POA: Diagnosis not present

## 2019-06-17 DIAGNOSIS — D631 Anemia in chronic kidney disease: Secondary | ICD-10-CM | POA: Diagnosis not present

## 2019-06-17 DIAGNOSIS — N1831 Chronic kidney disease, stage 3a: Secondary | ICD-10-CM | POA: Diagnosis not present

## 2019-06-17 DIAGNOSIS — F419 Anxiety disorder, unspecified: Secondary | ICD-10-CM | POA: Diagnosis not present

## 2019-06-17 DIAGNOSIS — I1310 Hypertensive heart and chronic kidney disease without heart failure, with stage 1 through stage 4 chronic kidney disease, or unspecified chronic kidney disease: Secondary | ICD-10-CM | POA: Diagnosis not present

## 2019-06-17 DIAGNOSIS — K219 Gastro-esophageal reflux disease without esophagitis: Secondary | ICD-10-CM | POA: Diagnosis not present

## 2019-06-20 DIAGNOSIS — I1310 Hypertensive heart and chronic kidney disease without heart failure, with stage 1 through stage 4 chronic kidney disease, or unspecified chronic kidney disease: Secondary | ICD-10-CM | POA: Diagnosis not present

## 2019-06-20 DIAGNOSIS — D631 Anemia in chronic kidney disease: Secondary | ICD-10-CM | POA: Diagnosis not present

## 2019-06-20 DIAGNOSIS — K219 Gastro-esophageal reflux disease without esophagitis: Secondary | ICD-10-CM | POA: Diagnosis not present

## 2019-06-20 DIAGNOSIS — F419 Anxiety disorder, unspecified: Secondary | ICD-10-CM | POA: Diagnosis not present

## 2019-06-20 DIAGNOSIS — F0280 Dementia in other diseases classified elsewhere without behavioral disturbance: Secondary | ICD-10-CM | POA: Diagnosis not present

## 2019-06-20 DIAGNOSIS — N1831 Chronic kidney disease, stage 3a: Secondary | ICD-10-CM | POA: Diagnosis not present

## 2019-06-20 DIAGNOSIS — M810 Age-related osteoporosis without current pathological fracture: Secondary | ICD-10-CM | POA: Diagnosis not present

## 2019-06-20 DIAGNOSIS — E1122 Type 2 diabetes mellitus with diabetic chronic kidney disease: Secondary | ICD-10-CM | POA: Diagnosis not present

## 2019-06-22 DIAGNOSIS — N1831 Chronic kidney disease, stage 3a: Secondary | ICD-10-CM | POA: Diagnosis not present

## 2019-06-22 DIAGNOSIS — D631 Anemia in chronic kidney disease: Secondary | ICD-10-CM | POA: Diagnosis not present

## 2019-06-22 DIAGNOSIS — E1122 Type 2 diabetes mellitus with diabetic chronic kidney disease: Secondary | ICD-10-CM | POA: Diagnosis not present

## 2019-06-22 DIAGNOSIS — I1310 Hypertensive heart and chronic kidney disease without heart failure, with stage 1 through stage 4 chronic kidney disease, or unspecified chronic kidney disease: Secondary | ICD-10-CM | POA: Diagnosis not present

## 2019-06-22 DIAGNOSIS — F419 Anxiety disorder, unspecified: Secondary | ICD-10-CM | POA: Diagnosis not present

## 2019-06-22 DIAGNOSIS — K219 Gastro-esophageal reflux disease without esophagitis: Secondary | ICD-10-CM | POA: Diagnosis not present

## 2019-06-22 DIAGNOSIS — F0280 Dementia in other diseases classified elsewhere without behavioral disturbance: Secondary | ICD-10-CM | POA: Diagnosis not present

## 2019-06-22 DIAGNOSIS — M810 Age-related osteoporosis without current pathological fracture: Secondary | ICD-10-CM | POA: Diagnosis not present

## 2019-06-24 DIAGNOSIS — K219 Gastro-esophageal reflux disease without esophagitis: Secondary | ICD-10-CM | POA: Diagnosis not present

## 2019-06-24 DIAGNOSIS — N1831 Chronic kidney disease, stage 3a: Secondary | ICD-10-CM | POA: Diagnosis not present

## 2019-06-24 DIAGNOSIS — F0280 Dementia in other diseases classified elsewhere without behavioral disturbance: Secondary | ICD-10-CM | POA: Diagnosis not present

## 2019-06-24 DIAGNOSIS — F419 Anxiety disorder, unspecified: Secondary | ICD-10-CM | POA: Diagnosis not present

## 2019-06-24 DIAGNOSIS — I1310 Hypertensive heart and chronic kidney disease without heart failure, with stage 1 through stage 4 chronic kidney disease, or unspecified chronic kidney disease: Secondary | ICD-10-CM | POA: Diagnosis not present

## 2019-06-24 DIAGNOSIS — M810 Age-related osteoporosis without current pathological fracture: Secondary | ICD-10-CM | POA: Diagnosis not present

## 2019-06-24 DIAGNOSIS — E1122 Type 2 diabetes mellitus with diabetic chronic kidney disease: Secondary | ICD-10-CM | POA: Diagnosis not present

## 2019-06-24 DIAGNOSIS — D631 Anemia in chronic kidney disease: Secondary | ICD-10-CM | POA: Diagnosis not present

## 2019-06-27 DIAGNOSIS — K219 Gastro-esophageal reflux disease without esophagitis: Secondary | ICD-10-CM | POA: Diagnosis not present

## 2019-06-27 DIAGNOSIS — E1122 Type 2 diabetes mellitus with diabetic chronic kidney disease: Secondary | ICD-10-CM | POA: Diagnosis not present

## 2019-06-27 DIAGNOSIS — N1831 Chronic kidney disease, stage 3a: Secondary | ICD-10-CM | POA: Diagnosis not present

## 2019-06-27 DIAGNOSIS — M810 Age-related osteoporosis without current pathological fracture: Secondary | ICD-10-CM | POA: Diagnosis not present

## 2019-06-27 DIAGNOSIS — F419 Anxiety disorder, unspecified: Secondary | ICD-10-CM | POA: Diagnosis not present

## 2019-06-27 DIAGNOSIS — F0280 Dementia in other diseases classified elsewhere without behavioral disturbance: Secondary | ICD-10-CM | POA: Diagnosis not present

## 2019-06-27 DIAGNOSIS — D631 Anemia in chronic kidney disease: Secondary | ICD-10-CM | POA: Diagnosis not present

## 2019-06-27 DIAGNOSIS — I1310 Hypertensive heart and chronic kidney disease without heart failure, with stage 1 through stage 4 chronic kidney disease, or unspecified chronic kidney disease: Secondary | ICD-10-CM | POA: Diagnosis not present

## 2019-06-28 ENCOUNTER — Ambulatory Visit (INDEPENDENT_AMBULATORY_CARE_PROVIDER_SITE_OTHER): Payer: Medicare HMO | Admitting: Physical Medicine and Rehabilitation

## 2019-06-28 ENCOUNTER — Telehealth: Payer: Self-pay | Admitting: Physical Medicine and Rehabilitation

## 2019-06-28 ENCOUNTER — Encounter: Payer: Self-pay | Admitting: Physical Medicine and Rehabilitation

## 2019-06-28 DIAGNOSIS — G894 Chronic pain syndrome: Secondary | ICD-10-CM | POA: Diagnosis not present

## 2019-06-28 DIAGNOSIS — M48062 Spinal stenosis, lumbar region with neurogenic claudication: Secondary | ICD-10-CM | POA: Diagnosis not present

## 2019-06-28 DIAGNOSIS — F0150 Vascular dementia without behavioral disturbance: Secondary | ICD-10-CM

## 2019-06-28 DIAGNOSIS — M47816 Spondylosis without myelopathy or radiculopathy, lumbar region: Secondary | ICD-10-CM | POA: Diagnosis not present

## 2019-06-28 DIAGNOSIS — M5416 Radiculopathy, lumbar region: Secondary | ICD-10-CM | POA: Diagnosis not present

## 2019-06-28 NOTE — Progress Notes (Signed)
Shawna Lynch - 84 y.o. female MRN [id number]  Date of birth: 05/12/1935  Office Visit Note: Visit Date: 06/28/2019 PCP: Isaias Sakai, DO Referred by: Alonna Buckler*  Subjective: Chief Complaint  Patient presents with   Lower Back - Pain   Right Hip - Pain   Left Hip - Pain   Left Leg - Pain   Right Leg - Pain   HPI: Shawna Lynch is a 84 y.o. female who comes in today As a new patient evaluation and management for chronic worsening severe low back pain and bilateral radicular type leg pain with difficulty ambulating.  Her daughter is present with her today that provides the majority of the history.  I have actually seen Shawna Lynch in the past and the last time I saw her was in 2015 and we completed facet joint blocks at that time and prior to that we had completed epidural injection.  She has MRI from 2014 which is reviewed below and reviewed with the patient and her daughter today.  She is not really had a lot of care for her lower back as she has been having a lot of issues with heart disease and cognitive decline and multiple medical issues.  Patient does report a recent fall and she was evaluated by healthcare providers at [facility] and x-rays were obtained of the lumbar spine and left hip.  There were no fractures or dislocation noted on these x-rays.  She is also been followed by [facility] by Dr. Sabra Heck or may be an assistant for Colles' fracture on the right.  She has been having a lot of pain in the spine and legs even prior to the fall.  She denies any groin pain.  She denies any focal weakness or foot drop.  There is been no real changes in bowel or bladder issues.  Patient does not really answer a lot of questions today and this is really mainly gathered through her daughter.  She does ambulate with a rolling walker with a forward flexed spine.  They do comment that she gets some relief with forward flexion of the spine.  She has had no other red flag type  complaints.  She has lost a lot of weight that is been progressive for a while.  She has no specific night pain.  She has had no recent MRI.  She is on anticoagulation.  She does have type 2 diabetes.  Review of Systems  Constitutional: Negative for chills, fever, malaise/fatigue and weight loss.  HENT: Negative for hearing loss and sinus pain.   Eyes: Negative for blurred vision, double vision and photophobia.  Respiratory: Negative for cough and shortness of breath.   Cardiovascular: Negative for chest pain, palpitations and leg swelling.  Gastrointestinal: Negative for abdominal pain, nausea and vomiting.  Genitourinary: Negative for flank pain.  Musculoskeletal: Positive for back pain and joint pain. Negative for myalgias.       Bilateral leg pain  Skin: Negative for itching and rash.  Neurological: Negative for tremors, focal weakness and weakness.  Endo/Heme/Allergies: Negative.   Psychiatric/Behavioral: Positive for memory loss. Negative for depression.  All other systems reviewed and are negative.  Otherwise per HPI.  Assessment & Plan: Visit Diagnoses:  1. Spinal stenosis of lumbar region with neurogenic claudication   2. Lumbar radiculopathy   3. Spondylosis without myelopathy or radiculopathy, lumbar region   4. Chronic pain syndrome   5. Vascular dementia without behavioral disturbance ([place])  Plan: Findings:  Chronic worsening low back pain and bilateral radicular leg pain surely related to pretty severe multifactorial stenosis at L3-4 and L4-5.  No red flag complaints today to warrant MRI imaging of the spine or even advanced imaging.  More recent x-rays were performed by [facility] after a fall.  Patient's had multiple falls.  She is being treated with right Colles' fracture as well.  On exam she has good strength.  I think epidural injection at L4 or L3 from a transforaminal approach is definitely the right next step to see if we get her some pain relief and hopefully get her  moving better and keeping stronger.  We went over the risk and benefits of this.  She is complicated by the dementia and type 2 diabetes as well as being on chronic anticoagulation which we really do not have to stop for this injection.  All questions were answered we did go over the spine model and they do want proceed with injection and I think best the first step.  She would do good with continued physical therapy and strengthening.    Meds & Orders: No orders of the defined types were placed in this encounter.  No orders of the defined types were placed in this encounter.   Follow-up: Return for Bilateral L3 or L4 transforaminal epidural steroid injection.   Procedures: No procedures performed  No notes on file   Clinical History: MRI LUMBAR SPINE WITHOUT CONTRAST May 13, 2013   Technique: Multiplanar and multiecho pulse sequences of the lumbar spine were obtained without intravenous contrast.   Comparison: None   Findings: The sagittal MR images demonstrate advanced degenerative lumbar spondylosis with significant multilevel disc disease and facet disease. There is a mild left convex lumbar scoliosis. The vertebral bodies demonstrate normal marrow signal except for significant endplate reactive changes and scattered hemangiomas. The last full intervertebral disc space is labeled L5-S1 and the conus medullaris terminates at L1.   No significant paraspinal or retroperitoneal process is identified.   L1-2: Advanced degenerative disc disease. There is a broad-based right paracentral and foraminal disc protrusion along with significant osteophytic spurring contributing to moderate right lateral recess and foraminal stenosis.   L2-3: Broad-based right paracentral and foraminal disc protrusion along with osteophytic spurring contributing to significant right lateral recess stenosis and right foraminal stenosis. There is also mild spinal stenosis.   L3-4: Broad-based disc  protrusion, osteophytic ridging, short pedicles and advanced facet disease contribute to moderately severe spinal stenosis and severe bilateral lateral recess stenosis. Moderate, right greater than left, foraminal stenosis.   L4-5: Severe degenerative disc disease with a central disc protrusion, osteophytic spurring, short pedicles and severe facet disease contributing to moderately severe to severe spinal and bilateral lateral recess stenosis and moderately severe left foraminal stenosis.   L5-S1: Advanced disc disease and facet disease with a bulging degenerated annulus and osteophytic spurring. No significant spinal stenosis. There is mild bilateral foraminal stenosis, left greater than right.   IMPRESSION:   1. Severe degenerative lumbar spondylosis with multilevel disc disease and facet disease. 2. Multilevel multifactorial spinal, lateral recess and foraminal stenosis with specific findings as discussed above at the individual levels. L3-4 and L4-5 on are most significant.   She reports that she has quit smoking. She has never used smokeless tobacco. No results for input(s): HGBA1C, LABURIC in the last 8760 hours.  Objective:  VS:  HT:     WT:    BMI:      BP:  HR: bpm   TEMP: ( )   RESP:  Physical Exam Vitals signs and nursing note reviewed.  Constitutional:      General: She is not in acute distress.    Appearance: Normal appearance. She is well-developed.     Comments: Thin appearing  HENT:     Head: Normocephalic and atraumatic.     Nose: Nose normal.     Mouth/Throat:     Mouth: Mucous membranes are moist.     Pharynx: Oropharynx is clear.  Eyes:     Conjunctiva/sclera: Conjunctivae normal.     Pupils: Pupils are equal, round, and reactive to light.  Neck:     Musculoskeletal: Normal range of motion and neck supple.  Cardiovascular:     Rate and Rhythm: Regular rhythm.  Pulmonary:     Effort: Pulmonary effort is normal. No respiratory distress.  Abdominal:      General: There is no distension.     Palpations: Abdomen is soft.     Tenderness: There is no guarding.  Musculoskeletal:     Right lower leg: No edema.     Left lower leg: No edema.     Comments: Patient slow to arise from a seated position she does have pain with extension she cannot extend fully.  She does ambulate with a forward flexed lumbar spine.  She has pain with rotation.  She has no pain with hip rotation.  She has good distal strength without clonus.  Skin:    General: Skin is warm and dry.     Findings: No erythema or rash.  Neurological:     General: No focal deficit present.     Mental Status: She is alert and oriented to person, place, and time.     Motor: No abnormal muscle tone.     Coordination: Coordination normal.     Gait: Gait abnormal.  Psychiatric:     Comments: Patient was able to follow commands and did not appropriately to some questions but was really not very verbal in terms of answering questions.     Ortho Exam Imaging: No results found.  Past Medical/Family/Surgical/Social History: Medications & Allergies reviewed per EMR, new medications updated. Patient Active Problem List   Diagnosis Date Noted   Mild cognitive impairment 03/09/2018   Pruritus 03/09/2018   CAD S/P percutaneous coronary angioplasty 04/27/2017   Dementia ([place]) 04/27/2017   Dyslipidemia 04/07/2017   Non-insulin treated type 2 diabetes mellitus ([place]) 04/06/2017   Essential hypertension 04/06/2017   History of non-ST elevation myocardial infarction (NSTEMI) 04/03/2017   Past Medical History:  Diagnosis Date   Anxiety    Arthritis    oa   Coronary artery disease    04/06/17 PCI with DES to [facility], 90% in diag from LAD, nonobstructive disease in LAD, RCA. Normal EF.    Dementia ([place])    Depression    Diabetes mellitus without complication ([place])    Hypertension    NSTEMI (non-ST elevated myocardial infarction) ([place])    Ringing in ears    TIA (transient  ischemic attack)    Family History  Problem Relation Age of Onset   CAD Father    Cancer Sister    Hearing loss Mother    Hypertension Mother    Past Surgical History:  Procedure Laterality Date   ABDOMINAL HYSTERECTOMY     CORONARY STENT INTERVENTION  04/06/2017   CORONARY STENT INTERVENTION N/A 04/06/2017   Procedure: Coronary Stent Intervention;  Surgeon: Sherren Mocha, MD;  Location: [place] [facility];  Service: Cardiovascular;  Laterality: N/A;   LEFT HEART CATH AND CORONARY ANGIOGRAPHY N/A 04/06/2017   Procedure: Left Heart Cath and Coronary Angiography;  Surgeon: Sherren Mocha, MD;  Location: [place] [facility];  Service: Cardiovascular;  Laterality: N/A;   Social History   Occupational History   Occupation: Retired  Tobacco Use   Smoking status: Former Smoker   Smokeless tobacco: Never Used   Tobacco comment: quit "20 years ago"  Substance and Sexual Activity   Alcohol use: No   Drug use: No   Sexual activity: Not on file

## 2019-06-28 NOTE — Progress Notes (Signed)
  Numeric Pain Rating Scale and Functional Assessment Average Pain 9   In the last MONTH (on 0-10 scale) has pain interfered with the following?  1. General activity like being  able to carry out your everyday physical activities such as walking, climbing stairs, carrying groceries, or moving a chair?  Rating(10)

## 2019-06-28 NOTE — Telephone Encounter (Signed)
Your authorization request is approved. Your interim reference number for this approval is: [id number] Auth No / Request ID  Status Auto-Approved Decision Approved Effective Date 06/28/2019 Expiration Date 08/12/2019 Decision Date 06/28/2019

## 2019-06-29 DIAGNOSIS — M810 Age-related osteoporosis without current pathological fracture: Secondary | ICD-10-CM | POA: Diagnosis not present

## 2019-06-29 DIAGNOSIS — N1831 Chronic kidney disease, stage 3a: Secondary | ICD-10-CM | POA: Diagnosis not present

## 2019-06-29 DIAGNOSIS — F419 Anxiety disorder, unspecified: Secondary | ICD-10-CM | POA: Diagnosis not present

## 2019-06-29 DIAGNOSIS — F0280 Dementia in other diseases classified elsewhere without behavioral disturbance: Secondary | ICD-10-CM | POA: Diagnosis not present

## 2019-06-29 DIAGNOSIS — D631 Anemia in chronic kidney disease: Secondary | ICD-10-CM | POA: Diagnosis not present

## 2019-06-29 DIAGNOSIS — K219 Gastro-esophageal reflux disease without esophagitis: Secondary | ICD-10-CM | POA: Diagnosis not present

## 2019-06-29 DIAGNOSIS — E1122 Type 2 diabetes mellitus with diabetic chronic kidney disease: Secondary | ICD-10-CM | POA: Diagnosis not present

## 2019-06-29 DIAGNOSIS — I1310 Hypertensive heart and chronic kidney disease without heart failure, with stage 1 through stage 4 chronic kidney disease, or unspecified chronic kidney disease: Secondary | ICD-10-CM | POA: Diagnosis not present

## 2019-06-30 ENCOUNTER — Ambulatory Visit: Payer: Self-pay

## 2019-06-30 ENCOUNTER — Encounter: Payer: Self-pay | Admitting: Physical Medicine and Rehabilitation

## 2019-06-30 ENCOUNTER — Ambulatory Visit (INDEPENDENT_AMBULATORY_CARE_PROVIDER_SITE_OTHER): Payer: Medicare HMO | Admitting: Physical Medicine and Rehabilitation

## 2019-06-30 VITALS — BP 122/78 | HR 86

## 2019-06-30 DIAGNOSIS — M48062 Spinal stenosis, lumbar region with neurogenic claudication: Secondary | ICD-10-CM | POA: Diagnosis not present

## 2019-06-30 DIAGNOSIS — M5416 Radiculopathy, lumbar region: Secondary | ICD-10-CM

## 2019-06-30 MED ORDER — BETAMETHASONE SOD PHOS & ACET 6 (3-3) MG/ML IJ SUSP
12.0000 mg | Freq: Once | INTRAMUSCULAR | Status: AC
  Administered 2019-06-30: 12 mg

## 2019-06-30 NOTE — Progress Notes (Signed)
 .  Numeric Pain Rating Scale and Functional Assessment Average Pain 5   In the last MONTH (on 0-10 scale) has pain interfered with the following?  1. General activity like being  able to carry out your everyday physical activities such as walking, climbing stairs, carrying groceries, or moving a chair?  Rating(6)   +Driver, +BT(plavix, ok for inj), -Dye Allergies.

## 2019-07-05 DIAGNOSIS — E1122 Type 2 diabetes mellitus with diabetic chronic kidney disease: Secondary | ICD-10-CM | POA: Diagnosis not present

## 2019-07-05 DIAGNOSIS — N183 Chronic kidney disease, stage 3 unspecified: Secondary | ICD-10-CM | POA: Diagnosis not present

## 2019-07-05 DIAGNOSIS — I25118 Atherosclerotic heart disease of native coronary artery with other forms of angina pectoris: Secondary | ICD-10-CM | POA: Diagnosis not present

## 2019-07-05 DIAGNOSIS — E7800 Pure hypercholesterolemia, unspecified: Secondary | ICD-10-CM | POA: Diagnosis not present

## 2019-07-05 DIAGNOSIS — K219 Gastro-esophageal reflux disease without esophagitis: Secondary | ICD-10-CM | POA: Diagnosis not present

## 2019-07-05 DIAGNOSIS — N1832 Chronic kidney disease, stage 3b: Secondary | ICD-10-CM | POA: Diagnosis not present

## 2019-07-05 DIAGNOSIS — F0280 Dementia in other diseases classified elsewhere without behavioral disturbance: Secondary | ICD-10-CM | POA: Diagnosis not present

## 2019-07-05 DIAGNOSIS — M810 Age-related osteoporosis without current pathological fracture: Secondary | ICD-10-CM | POA: Diagnosis not present

## 2019-07-05 DIAGNOSIS — F419 Anxiety disorder, unspecified: Secondary | ICD-10-CM | POA: Diagnosis not present

## 2019-07-05 DIAGNOSIS — I1310 Hypertensive heart and chronic kidney disease without heart failure, with stage 1 through stage 4 chronic kidney disease, or unspecified chronic kidney disease: Secondary | ICD-10-CM | POA: Diagnosis not present

## 2019-07-05 DIAGNOSIS — F329 Major depressive disorder, single episode, unspecified: Secondary | ICD-10-CM | POA: Diagnosis not present

## 2019-07-05 DIAGNOSIS — N1831 Chronic kidney disease, stage 3a: Secondary | ICD-10-CM | POA: Diagnosis not present

## 2019-07-05 DIAGNOSIS — D631 Anemia in chronic kidney disease: Secondary | ICD-10-CM | POA: Diagnosis not present

## 2019-07-05 DIAGNOSIS — I10 Essential (primary) hypertension: Secondary | ICD-10-CM | POA: Diagnosis not present

## 2019-07-06 DIAGNOSIS — I1310 Hypertensive heart and chronic kidney disease without heart failure, with stage 1 through stage 4 chronic kidney disease, or unspecified chronic kidney disease: Secondary | ICD-10-CM | POA: Diagnosis not present

## 2019-07-06 DIAGNOSIS — F419 Anxiety disorder, unspecified: Secondary | ICD-10-CM | POA: Diagnosis not present

## 2019-07-06 DIAGNOSIS — E1122 Type 2 diabetes mellitus with diabetic chronic kidney disease: Secondary | ICD-10-CM | POA: Diagnosis not present

## 2019-07-06 DIAGNOSIS — F0280 Dementia in other diseases classified elsewhere without behavioral disturbance: Secondary | ICD-10-CM | POA: Diagnosis not present

## 2019-07-06 DIAGNOSIS — K219 Gastro-esophageal reflux disease without esophagitis: Secondary | ICD-10-CM | POA: Diagnosis not present

## 2019-07-06 DIAGNOSIS — M810 Age-related osteoporosis without current pathological fracture: Secondary | ICD-10-CM | POA: Diagnosis not present

## 2019-07-06 DIAGNOSIS — N1831 Chronic kidney disease, stage 3a: Secondary | ICD-10-CM | POA: Diagnosis not present

## 2019-07-06 DIAGNOSIS — D631 Anemia in chronic kidney disease: Secondary | ICD-10-CM | POA: Diagnosis not present

## 2019-07-07 DIAGNOSIS — E1122 Type 2 diabetes mellitus with diabetic chronic kidney disease: Secondary | ICD-10-CM | POA: Diagnosis not present

## 2019-07-07 DIAGNOSIS — F0280 Dementia in other diseases classified elsewhere without behavioral disturbance: Secondary | ICD-10-CM | POA: Diagnosis not present

## 2019-07-07 DIAGNOSIS — D631 Anemia in chronic kidney disease: Secondary | ICD-10-CM | POA: Diagnosis not present

## 2019-07-07 DIAGNOSIS — M810 Age-related osteoporosis without current pathological fracture: Secondary | ICD-10-CM | POA: Diagnosis not present

## 2019-07-07 DIAGNOSIS — F419 Anxiety disorder, unspecified: Secondary | ICD-10-CM | POA: Diagnosis not present

## 2019-07-07 DIAGNOSIS — I1310 Hypertensive heart and chronic kidney disease without heart failure, with stage 1 through stage 4 chronic kidney disease, or unspecified chronic kidney disease: Secondary | ICD-10-CM | POA: Diagnosis not present

## 2019-07-07 DIAGNOSIS — K219 Gastro-esophageal reflux disease without esophagitis: Secondary | ICD-10-CM | POA: Diagnosis not present

## 2019-07-07 DIAGNOSIS — N1831 Chronic kidney disease, stage 3a: Secondary | ICD-10-CM | POA: Diagnosis not present

## 2019-07-11 DIAGNOSIS — I1310 Hypertensive heart and chronic kidney disease without heart failure, with stage 1 through stage 4 chronic kidney disease, or unspecified chronic kidney disease: Secondary | ICD-10-CM | POA: Diagnosis not present

## 2019-07-11 DIAGNOSIS — F419 Anxiety disorder, unspecified: Secondary | ICD-10-CM | POA: Diagnosis not present

## 2019-07-11 DIAGNOSIS — D631 Anemia in chronic kidney disease: Secondary | ICD-10-CM | POA: Diagnosis not present

## 2019-07-11 DIAGNOSIS — F0280 Dementia in other diseases classified elsewhere without behavioral disturbance: Secondary | ICD-10-CM | POA: Diagnosis not present

## 2019-07-11 DIAGNOSIS — N1831 Chronic kidney disease, stage 3a: Secondary | ICD-10-CM | POA: Diagnosis not present

## 2019-07-11 DIAGNOSIS — K219 Gastro-esophageal reflux disease without esophagitis: Secondary | ICD-10-CM | POA: Diagnosis not present

## 2019-07-11 DIAGNOSIS — M810 Age-related osteoporosis without current pathological fracture: Secondary | ICD-10-CM | POA: Diagnosis not present

## 2019-07-11 DIAGNOSIS — E1122 Type 2 diabetes mellitus with diabetic chronic kidney disease: Secondary | ICD-10-CM | POA: Diagnosis not present

## 2019-07-18 DIAGNOSIS — D631 Anemia in chronic kidney disease: Secondary | ICD-10-CM | POA: Diagnosis not present

## 2019-07-18 DIAGNOSIS — N1831 Chronic kidney disease, stage 3a: Secondary | ICD-10-CM | POA: Diagnosis not present

## 2019-07-18 DIAGNOSIS — F0280 Dementia in other diseases classified elsewhere without behavioral disturbance: Secondary | ICD-10-CM | POA: Diagnosis not present

## 2019-07-18 DIAGNOSIS — E1122 Type 2 diabetes mellitus with diabetic chronic kidney disease: Secondary | ICD-10-CM | POA: Diagnosis not present

## 2019-07-18 DIAGNOSIS — F419 Anxiety disorder, unspecified: Secondary | ICD-10-CM | POA: Diagnosis not present

## 2019-07-18 DIAGNOSIS — I1310 Hypertensive heart and chronic kidney disease without heart failure, with stage 1 through stage 4 chronic kidney disease, or unspecified chronic kidney disease: Secondary | ICD-10-CM | POA: Diagnosis not present

## 2019-07-18 DIAGNOSIS — K219 Gastro-esophageal reflux disease without esophagitis: Secondary | ICD-10-CM | POA: Diagnosis not present

## 2019-07-18 DIAGNOSIS — M810 Age-related osteoporosis without current pathological fracture: Secondary | ICD-10-CM | POA: Diagnosis not present

## 2019-07-21 DIAGNOSIS — I1310 Hypertensive heart and chronic kidney disease without heart failure, with stage 1 through stage 4 chronic kidney disease, or unspecified chronic kidney disease: Secondary | ICD-10-CM | POA: Diagnosis not present

## 2019-07-21 DIAGNOSIS — M810 Age-related osteoporosis without current pathological fracture: Secondary | ICD-10-CM | POA: Diagnosis not present

## 2019-07-21 DIAGNOSIS — E1122 Type 2 diabetes mellitus with diabetic chronic kidney disease: Secondary | ICD-10-CM | POA: Diagnosis not present

## 2019-07-21 DIAGNOSIS — K219 Gastro-esophageal reflux disease without esophagitis: Secondary | ICD-10-CM | POA: Diagnosis not present

## 2019-07-21 DIAGNOSIS — N1831 Chronic kidney disease, stage 3a: Secondary | ICD-10-CM | POA: Diagnosis not present

## 2019-07-21 DIAGNOSIS — D631 Anemia in chronic kidney disease: Secondary | ICD-10-CM | POA: Diagnosis not present

## 2019-07-21 DIAGNOSIS — F0280 Dementia in other diseases classified elsewhere without behavioral disturbance: Secondary | ICD-10-CM | POA: Diagnosis not present

## 2019-07-21 DIAGNOSIS — F419 Anxiety disorder, unspecified: Secondary | ICD-10-CM | POA: Diagnosis not present

## 2019-07-26 NOTE — Progress Notes (Signed)
[name] - 84 y.o. female MRN [id number]  Date of birth: [birth year]  Office Visit Note: Visit Date: 06/30/2019 PCP: Isaias Sakai, DO Referred by: Alonna Buckler*  Subjective: Chief Complaint  Patient presents with  . Lower Back - Pain   HPI:  ROBBYE Lynch is a 84 y.o. female who comes in today For planned bilateral L4 transforaminal epidural steroid injection.  Please see our prior evaluation and management note for further details and justification.  ROS Otherwise per HPI.  Assessment & Plan: Visit Diagnoses:  1. Lumbar radiculopathy   2. Spinal stenosis of lumbar region with neurogenic claudication     Plan: No additional findings.   Meds & Orders:  Meds ordered this encounter  Medications  . betamethasone acetate-betamethasone sodium phosphate (CELESTONE) injection 12 mg    Orders Placed This Encounter  Procedures  . XR C-ARM NO REPORT  . Epidural Steroid injection    Follow-up: No follow-ups on file.   Procedures: No procedures performed  Lumbosacral Transforaminal Epidural Steroid Injection - Sub-Pedicular Approach with Fluoroscopic Guidance  Patient: Shawna Lynch      Date of Birth: Sep 16, 1935 MRN: [id number] PCP: Isaias Sakai, DO      Visit Date: 06/30/2019   Universal Protocol:    Date/Time: 06/30/2019  Consent Given By: the patient  Position: PRONE  Additional Comments: Vital signs were monitored before and after the procedure. Patient was prepped and draped in the usual sterile fashion. The correct patient, procedure, and site was verified.   Injection Procedure Details:  Procedure Site One Meds Administered:  Meds ordered this encounter  Medications  . betamethasone acetate-betamethasone sodium phosphate (CELESTONE) injection 12 mg    Laterality: Bilateral  Location/Site:  L4-L5  Needle size: 22 G  Needle type: Spinal  Needle Placement: Transforaminal  Findings:    -Comments: Excellent  flow of contrast along the nerve and into the epidural space.  Procedure Details: After squaring off the end-plates to get a true AP view, the C-arm was positioned so that an oblique view of the foramen as noted above was visualized. The target area is just inferior to the "nose of the scotty dog" or sub pedicular. The soft tissues overlying this structure were infiltrated with 2-3 ml. of 1% Lidocaine without Epinephrine.  The spinal needle was inserted toward the target using a "trajectory" view along the fluoroscope beam.  Under AP and lateral visualization, the needle was advanced so it did not puncture dura and was located close the 6 O'Clock position of the pedical in AP tracterory. Biplanar projections were used to confirm position. Aspiration was confirmed to be negative for CSF and/or blood. A 1-2 ml. volume of Isovue-250 was injected and flow of contrast was noted at each level. Radiographs were obtained for documentation purposes.   After attaining the desired flow of contrast documented above, a 0.5 to 1.0 ml test dose of 0.25% Marcaine was injected into each respective transforaminal space.  The patient was observed for 90 seconds post injection.  After no sensory deficits were reported, and normal lower extremity motor function was noted,   the above injectate was administered so that equal amounts of the injectate were placed at each foramen (level) into the transforaminal epidural space.   Additional Comments:  The patient tolerated the procedure well Dressing: 2 x 2 sterile gauze and Band-Aid    Post-procedure details: Patient was observed during the procedure. Post-procedure instructions were reviewed.  Patient left the clinic in  stable condition.     Clinical History: MRI LUMBAR SPINE WITHOUT CONTRAST May 13, 2013   Technique: Multiplanar and multiecho pulse sequences of the lumbar spine were obtained without intravenous contrast.   Comparison: None   Findings: The  sagittal MR images demonstrate advanced degenerative lumbar spondylosis with significant multilevel disc disease and facet disease. There is a mild left convex lumbar scoliosis. The vertebral bodies demonstrate normal marrow signal except for significant endplate reactive changes and scattered hemangiomas. The last full intervertebral disc space is labeled L5-S1 and the conus medullaris terminates at L1.   No significant paraspinal or retroperitoneal process is identified.   L1-2: Advanced degenerative disc disease. There is a broad-based right paracentral and foraminal disc protrusion along with significant osteophytic spurring contributing to moderate right lateral recess and foraminal stenosis.   L2-3: Broad-based right paracentral and foraminal disc protrusion along with osteophytic spurring contributing to significant right lateral recess stenosis and right foraminal stenosis. There is also mild spinal stenosis.   L3-4: Broad-based disc protrusion, osteophytic ridging, short pedicles and advanced facet disease contribute to moderately severe spinal stenosis and severe bilateral lateral recess stenosis. Moderate, right greater than left, foraminal stenosis.   L4-5: Severe degenerative disc disease with a central disc protrusion, osteophytic spurring, short pedicles and severe facet disease contributing to moderately severe to severe spinal and bilateral lateral recess stenosis and moderately severe left foraminal stenosis.   L5-S1: Advanced disc disease and facet disease with a bulging degenerated annulus and osteophytic spurring. No significant spinal stenosis. There is mild bilateral foraminal stenosis, left greater than right.   IMPRESSION:   1. Severe degenerative lumbar spondylosis with multilevel disc disease and facet disease. 2. Multilevel multifactorial spinal, lateral recess and foraminal stenosis with specific findings as discussed above at the individual  levels. L3-4 and L4-5 on are most significant.     Objective:  VS:  HT:    WT:   BMI:     BP:122/78  HR:86bpm  TEMP: ( )  RESP:  Physical Exam  Ortho Exam Imaging: No results found.

## 2019-07-26 NOTE — Procedures (Signed)
Lumbosacral Transforaminal Epidural Steroid Injection - Sub-Pedicular Approach with Fluoroscopic Guidance  Patient: Shawna Lynch      Date of Birth: Jun 09, 1935 MRN: [id number] PCP: Isaias Sakai, DO      Visit Date: 06/30/2019   Universal Protocol:    Date/Time: 06/30/2019  Consent Given By: the patient  Position: PRONE  Additional Comments: Vital signs were monitored before and after the procedure. Patient was prepped and draped in the usual sterile fashion. The correct patient, procedure, and site was verified.   Injection Procedure Details:  Procedure Site One Meds Administered:  Meds ordered this encounter  Medications  . betamethasone acetate-betamethasone sodium phosphate (CELESTONE) injection 12 mg    Laterality: Bilateral  Location/Site:  L4-L5  Needle size: 22 G  Needle type: Spinal  Needle Placement: Transforaminal  Findings:    -Comments: Excellent flow of contrast along the nerve and into the epidural space.  Procedure Details: After squaring off the end-plates to get a true AP view, the C-arm was positioned so that an oblique view of the foramen as noted above was visualized. The target area is just inferior to the "nose of the scotty dog" or sub pedicular. The soft tissues overlying this structure were infiltrated with 2-3 ml. of 1% Lidocaine without Epinephrine.  The spinal needle was inserted toward the target using a "trajectory" view along the fluoroscope beam.  Under AP and lateral visualization, the needle was advanced so it did not puncture dura and was located close the 6 O'Clock position of the pedical in AP tracterory. Biplanar projections were used to confirm position. Aspiration was confirmed to be negative for CSF and/or blood. A 1-2 ml. volume of Isovue-250 was injected and flow of contrast was noted at each level. Radiographs were obtained for documentation purposes.   After attaining the desired flow of contrast documented  above, a 0.5 to 1.0 ml test dose of 0.25% Marcaine was injected into each respective transforaminal space.  The patient was observed for 90 seconds post injection.  After no sensory deficits were reported, and normal lower extremity motor function was noted,   the above injectate was administered so that equal amounts of the injectate were placed at each foramen (level) into the transforaminal epidural space.   Additional Comments:  The patient tolerated the procedure well Dressing: 2 x 2 sterile gauze and Band-Aid    Post-procedure details: Patient was observed during the procedure. Post-procedure instructions were reviewed.  Patient left the clinic in stable condition.

## 2019-07-27 DIAGNOSIS — F419 Anxiety disorder, unspecified: Secondary | ICD-10-CM | POA: Diagnosis not present

## 2019-07-27 DIAGNOSIS — K219 Gastro-esophageal reflux disease without esophagitis: Secondary | ICD-10-CM | POA: Diagnosis not present

## 2019-07-27 DIAGNOSIS — N1831 Chronic kidney disease, stage 3a: Secondary | ICD-10-CM | POA: Diagnosis not present

## 2019-07-27 DIAGNOSIS — D631 Anemia in chronic kidney disease: Secondary | ICD-10-CM | POA: Diagnosis not present

## 2019-07-27 DIAGNOSIS — F0280 Dementia in other diseases classified elsewhere without behavioral disturbance: Secondary | ICD-10-CM | POA: Diagnosis not present

## 2019-07-27 DIAGNOSIS — I1310 Hypertensive heart and chronic kidney disease without heart failure, with stage 1 through stage 4 chronic kidney disease, or unspecified chronic kidney disease: Secondary | ICD-10-CM | POA: Diagnosis not present

## 2019-07-27 DIAGNOSIS — E1122 Type 2 diabetes mellitus with diabetic chronic kidney disease: Secondary | ICD-10-CM | POA: Diagnosis not present

## 2019-07-27 DIAGNOSIS — M810 Age-related osteoporosis without current pathological fracture: Secondary | ICD-10-CM | POA: Diagnosis not present

## 2019-08-03 DIAGNOSIS — N1831 Chronic kidney disease, stage 3a: Secondary | ICD-10-CM | POA: Diagnosis not present

## 2019-08-03 DIAGNOSIS — E1122 Type 2 diabetes mellitus with diabetic chronic kidney disease: Secondary | ICD-10-CM | POA: Diagnosis not present

## 2019-08-03 DIAGNOSIS — I1310 Hypertensive heart and chronic kidney disease without heart failure, with stage 1 through stage 4 chronic kidney disease, or unspecified chronic kidney disease: Secondary | ICD-10-CM | POA: Diagnosis not present

## 2019-08-03 DIAGNOSIS — F419 Anxiety disorder, unspecified: Secondary | ICD-10-CM | POA: Diagnosis not present

## 2019-08-03 DIAGNOSIS — K219 Gastro-esophageal reflux disease without esophagitis: Secondary | ICD-10-CM | POA: Diagnosis not present

## 2019-08-03 DIAGNOSIS — D631 Anemia in chronic kidney disease: Secondary | ICD-10-CM | POA: Diagnosis not present

## 2019-08-03 DIAGNOSIS — F0280 Dementia in other diseases classified elsewhere without behavioral disturbance: Secondary | ICD-10-CM | POA: Diagnosis not present

## 2019-08-03 DIAGNOSIS — M810 Age-related osteoporosis without current pathological fracture: Secondary | ICD-10-CM | POA: Diagnosis not present

## 2019-08-09 DIAGNOSIS — K219 Gastro-esophageal reflux disease without esophagitis: Secondary | ICD-10-CM | POA: Diagnosis not present

## 2019-08-09 DIAGNOSIS — M810 Age-related osteoporosis without current pathological fracture: Secondary | ICD-10-CM | POA: Diagnosis not present

## 2019-08-09 DIAGNOSIS — N1831 Chronic kidney disease, stage 3a: Secondary | ICD-10-CM | POA: Diagnosis not present

## 2019-08-09 DIAGNOSIS — E1122 Type 2 diabetes mellitus with diabetic chronic kidney disease: Secondary | ICD-10-CM | POA: Diagnosis not present

## 2019-08-09 DIAGNOSIS — F0280 Dementia in other diseases classified elsewhere without behavioral disturbance: Secondary | ICD-10-CM | POA: Diagnosis not present

## 2019-08-09 DIAGNOSIS — D631 Anemia in chronic kidney disease: Secondary | ICD-10-CM | POA: Diagnosis not present

## 2019-08-09 DIAGNOSIS — I1310 Hypertensive heart and chronic kidney disease without heart failure, with stage 1 through stage 4 chronic kidney disease, or unspecified chronic kidney disease: Secondary | ICD-10-CM | POA: Diagnosis not present

## 2019-08-09 DIAGNOSIS — F419 Anxiety disorder, unspecified: Secondary | ICD-10-CM | POA: Diagnosis not present

## 2019-10-10 DIAGNOSIS — F419 Anxiety disorder, unspecified: Secondary | ICD-10-CM | POA: Diagnosis not present

## 2019-10-10 DIAGNOSIS — F329 Major depressive disorder, single episode, unspecified: Secondary | ICD-10-CM | POA: Diagnosis not present

## 2019-10-10 DIAGNOSIS — F0280 Dementia in other diseases classified elsewhere without behavioral disturbance: Secondary | ICD-10-CM | POA: Diagnosis not present

## 2019-10-10 DIAGNOSIS — E1122 Type 2 diabetes mellitus with diabetic chronic kidney disease: Secondary | ICD-10-CM | POA: Diagnosis not present

## 2019-10-10 DIAGNOSIS — N1832 Chronic kidney disease, stage 3b: Secondary | ICD-10-CM | POA: Diagnosis not present

## 2019-10-10 DIAGNOSIS — I10 Essential (primary) hypertension: Secondary | ICD-10-CM | POA: Diagnosis not present

## 2019-10-10 DIAGNOSIS — N183 Chronic kidney disease, stage 3 unspecified: Secondary | ICD-10-CM | POA: Diagnosis not present

## 2019-10-10 DIAGNOSIS — I25118 Atherosclerotic heart disease of native coronary artery with other forms of angina pectoris: Secondary | ICD-10-CM | POA: Diagnosis not present

## 2019-10-10 DIAGNOSIS — E7800 Pure hypercholesterolemia, unspecified: Secondary | ICD-10-CM | POA: Diagnosis not present

## 2019-10-10 DIAGNOSIS — E559 Vitamin D deficiency, unspecified: Secondary | ICD-10-CM | POA: Diagnosis not present

## 2019-10-12 NOTE — Progress Notes (Signed)
PATIENT: Shawna Lynch DOB: [birth year]  REASON FOR VISIT: follow up HISTORY FROM: patient  HISTORY OF PRESENT ILLNESS: Today 10/13/19  HISTORY HISTORY  Shawna Arrey Wrightis a 85 years old female, accompanied by her daughter Shawna Lynch, seen in refer by her primary care PA for evaluation of memory loss, headache, initial evaluation was on September 03, 2017.  I reviewed and summarized the referring note, she has past medical history of hypertension, diabetes, hyperlipidemia, coronary artery disease, memory loss, taking Namendaxr28 mg, donepezil 10 mg, since March 2018,  Retired from Psychologist, educational job, lives across the street from her daughter by herself, she was noted to have gradual onset memory loss since 2017, no longer able to attend her weekly get together with her friends regularly, her daughter has to take over financial part since 2017,  She also reported a history of long history of headaches, now she has different kind of headaches, pressure, symmetric, no significant light noise sensitivity,  She was started on Aricept and Namenda March 2017, tolerating the medication well, there was no significant improvement, he is also taking Zoloft 100 mg a day for depression,  Laboratory evaluation in 2018, CBC mild anemia hemoglobin of 11, BMP showed elevated creatinine 1.59  Personally reviewed CT head in November 2018, MRI of the brain 2017, generalized atrophy, supratentorium small vessel disease.  UPDATE March 09 2018:YYLaboratory evaluation in December 2018 showed normal negative vitamin B12, ESR, TSH, C-reactive protein, folic acid, RPR,  She is with her daughter at visit. 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 She is currently on Namzaric without side effects. Memory score is stable she continues to live independently. She can feed and dress and bathe herself. She does her laundry. Daughter checks on her frequently she continues to drive short distances and places where she knows. She has not had any accidents. She returns for reevaluation  Update April 07, 2019 SS: Last MMSE was 23/30, she remains on Namzaric daily. She thinks her memory is stable. She lives alone. She is able to do her ADLs. Daughter is across the street. She drives a car okay, only short distances. Needs help with housework. She likes to eat out, not much cooking. She has a good appetite. Her daughter puts her medications in pill box. Overall health has been good. She lives to watch TV, crossword puzzles, reads the paper. She has been depressed, her medication was adjusted, switched from Zoloft to Lexapro. She did have a fall, sprain to left wrist, wears a brace.    Update October 13, 2019 SS: Here with her daughter, she continues to live alone, but her daughter lives across the road.  She enjoys laying in the bed, resting, she admits to being "lazy", her daughter does her housework, Medical sales representative, she could do it, but doesn't want to.  She does not do any cooking, her daughter keeps food in the house, not much appetite, she does remain depressed, is on Lexapro 10 mg.  She sleeps well at night, she has not been driving.  In the last year she has lost 6 pounds, sometimes forgets to eat, daughter has noticed decline in memory, forgets to flush the toilet at times. She misses being able to get out, is getting her first Covid vaccine tonight.  REVIEW OF SYSTEMS: Out of a complete 14 system review of symptoms, the patient complains only of the following symptoms, and all other reviewed systems are negative.  Memory loss  ALLERGIES: Allergies  Allergen Reactions  . Penicillins Hives  . Sulfa Antibiotics  Rash  . Codeine Rash    HOME MEDICATIONS: Outpatient Medications Prior to Visit  Medication Sig Dispense Refill  . aspirin EC 81 MG EC tablet Take 1 tablet (81 mg total) by mouth daily. 30 tablet   . atorvastatin (LIPITOR) 80 MG tablet Take 1 tablet (80 mg total) by mouth daily at 6 PM. 90 tablet 1  . carvedilol (COREG) 25 MG tablet Take 12.5 mg by mouth 2 (two) times daily with a meal.    . cholecalciferol (VITAMIN D) 1000 units tablet Take 1,000 Units by mouth daily.    . clopidogrel (PLAVIX) 75 MG tablet TAKE 1 TABLET EVERY DAY WITH BREAKFAST 90 tablet 1  . Dulaglutide (TRULICITY [place]) Inject 9.29 mg into the skin once a week.    . escitalopram (LEXAPRO) 10 MG tablet Take 10 mg by mouth daily.    [name] FISH OIL-BORAGE-FLAX-SAFFLOWER PO Take 1 capsule by mouth daily.    . isosorbide mononitrate (IMDUR) 60 MG 24 hr tablet TAKE 1 TABLET EVERY DAY 90 tablet 1  . Memantine HCl-Donepezil HCl (NAMZARIC) 28-10 MG CP24 Take 1 capsule by mouth daily. 90 capsule 4  . metFORMIN (GLUCOPHAGE) 500 MG tablet Take 500 mg by mouth 2 (two) times daily.    . nitroGLYCERIN (NITROSTAT) 0.4 MG SL tablet Place 1 tablet (0.4 mg total) under the tongue every 5 (five) minutes x 3 doses as needed for chest pain. 25 tablet 2  . omega-3 acid ethyl esters (LOVAZA) 1 g capsule Take 1 capsule (1 g total) by mouth 2 (two) times daily. 180 capsule 3  . pioglitazone (ACTOS) 45 MG tablet Take 45 mg by mouth daily.    . traMADol (ULTRAM) 50 MG tablet Take 50 mg by mouth as needed.    . vitamin B-12 (CYANOCOBALAMIN) 1000 MCG tablet Take 1,000 mcg by mouth daily.    . pantoprazole (PROTONIX) 40 MG tablet Take 1 tablet (40 mg total) by mouth daily. 90 tablet 0   No facility-administered medications prior to visit.    PAST MEDICAL HISTORY: Past Medical History:  Diagnosis Date  . Anxiety   . Arthritis    oa  . Coronary artery disease    04/06/17 PCI with DES to [facility], 90% in diag from LAD, nonobstructive disease in LAD, RCA.  Normal EF.   [name] Dementia ([place])   . Depression   . Diabetes mellitus without complication ([place])   . Hypertension   . NSTEMI (non-ST elevated myocardial infarction) ([place])   . Ringing in ears   . TIA (transient ischemic attack)     PAST SURGICAL HISTORY: Past Surgical History:  Procedure Laterality Date  . ABDOMINAL HYSTERECTOMY    . CORONARY STENT INTERVENTION  04/06/2017  . CORONARY STENT INTERVENTION N/A 04/06/2017   Procedure: Coronary Stent Intervention;  Surgeon: Sherren Mocha, MD;  Location: [place] [facility];  Service: Cardiovascular;  Laterality: N/A;  . LEFT HEART CATH AND CORONARY ANGIOGRAPHY N/A 04/06/2017   Procedure: Left Heart Cath and Coronary Angiography;  Surgeon: Sherren Mocha, MD;  Location: [place] [facility];  Service: Cardiovascular;  Laterality: N/A;    FAMILY HISTORY: Family History  Problem Relation Age of Onset  . CAD Father   . Cancer Sister   .  Hearing loss Mother   . Hypertension Mother     SOCIAL HISTORY: Social History   Socioeconomic History  . Marital status: Married    Spouse name: Not on file  . Number of children: 2  . Years of education: 61  . Highest education level: High school graduate  Occupational History  . Occupation: Retired  Tobacco Use  . Smoking status: Former Research scientist (life sciences)  . Smokeless tobacco: Never Used  . Tobacco comment: quit "20 years ago"  Substance and Sexual Activity  . Alcohol use: No  . Drug use: No  . Sexual activity: Not on file  Other Topics Concern  . Not on file  Social History Narrative   Lives alone but daughter is right across the street.   Right-handed.   No caffeine use.   Social Determinants of Health   Financial Resource Strain:   . Difficulty of Paying Living Expenses: Not on file  Food Insecurity:   . Worried About Charity fundraiser in the Last Year: Not on file  . Ran Out of Food in the Last Year: Not on file  Transportation Needs:   . Lack of Transportation (Medical): Not on file  .  Lack of Transportation (Non-Medical): Not on file  Physical Activity:   . Days of Exercise per Week: Not on file  . Minutes of Exercise per Session: Not on file  Stress:   . Feeling of Stress : Not on file  Social Connections:   . Frequency of Communication with Friends and Family: Not on file  . Frequency of Social Gatherings with Friends and Family: Not on file  . Attends Religious Services: Not on file  . Active Member of Clubs or Organizations: Not on file  . Attends Archivist Meetings: Not on file  . Marital Status: Not on file  Intimate Partner Violence:   . Fear of Current or Ex-Partner: Not on file  . Emotionally Abused: Not on file  . Physically Abused: Not on file  . Sexually Abused: Not on file   PHYSICAL EXAM  Vitals:   10/13/19 0932  BP: 118/64  Pulse: 98  Temp: 97.6 F (36.4 C)  TempSrc: Oral  Weight: 132 lb 9.6 oz (60.1 kg)  Height: '5\' 6"'  (1.676 m)   Body mass index is 21.4 kg/m.  Generalized: Well developed, in no acute distress  MMSE - Mini Mental State Exam 10/13/2019 04/07/2019 10/07/2018  Not completed: (No Data) - (No Data)  Orientation to time 0 3 2  Orientation to Place '3 4 5  ' Registration '3 3 3  ' Attention/ Calculation 0 5 5  Recall 0 0 0  Language- name 2 objects '2 2 2  ' Language- repeat 0 1 1  Language- repeat-comments she said ifs ands or buts - -  Language- follow 3 step command '3 3 3  ' Language- follow 3 step command-comments - - She held the paper in the air once she folded it   Language- read & follow direction '1 1 1  ' Write a sentence '1 1 1  ' Copy design 1 1 0  Copy design-comments - 8 -  Total score '14 24 23    ' Neurological examination  Mentation: Alert, oriented, participatory, engaged, most of history is provided by daughter. Follows all commands speech and language fluent Cranial nerve II-XII: Pupils were equal round reactive to light. Extraocular movements were full, visual field were full on confrontational test. Facial  sensation and strength were normal.  Head turning and shoulder  shrug  were normal and symmetric. Motor: The motor testing reveals 5 over 5 strength of all 4 extremities. Good symmetric motor tone is noted throughout.  Sensory: Sensory testing is intact to soft touch on all 4 extremities. No evidence of extinction is noted.  Coordination: Cerebellar testing reveals good finger-nose-finger and heel-to-shin bilaterally.  Gait and station: Gait is mildly unsteady. Reflexes: Deep tendon reflexes are symmetric and normal bilaterally.   DIAGNOSTIC DATA (LABS, IMAGING, TESTING) - I reviewed patient records, labs, notes, testing and imaging myself where available.  Lab Results  Component Value Date   WBC 5.5 08/18/2017   HGB 11.0 (L) 08/18/2017   HCT 34.2 (L) 08/18/2017   MCV 91.9 08/18/2017   PLT 194 08/18/2017      Component Value Date/Time   NA 141 08/18/2017 0852   NA 139 12/17/2012 1305   K 3.9 08/18/2017 0852   K 4.0 12/17/2012 1305   CL 106 08/18/2017 0852   CL 102 12/17/2012 1305   CO2 26 08/18/2017 0852   CO2 29 12/17/2012 1305   GLUCOSE 141 (H) 08/18/2017 0852   GLUCOSE 151 (H) 12/17/2012 1305   BUN 19 08/18/2017 0852   BUN 15 12/17/2012 1305   CREATININE 1.59 (H) 08/18/2017 0852   CREATININE 1.00 12/17/2012 1305   CALCIUM 9.7 08/18/2017 0852   CALCIUM 9.2 12/17/2012 1305   PROT 8.1 12/17/2012 1305   ALBUMIN 3.7 12/17/2012 1305   AST 23 12/17/2012 1305   ALT 17 12/17/2012 1305   ALKPHOS 54 12/17/2012 1305   BILITOT 0.4 12/17/2012 1305   GFRNONAA 30 (L) 08/18/2017 0852   GFRNONAA 55 (L) 12/17/2012 1305   GFRAA 34 (L) 08/18/2017 0852   GFRAA >60 12/17/2012 1305   No results found for: CHOL, HDL, LDLCALC, LDLDIRECT, TRIG, CHOLHDL Lab Results  Component Value Date   HGBA1C 7.7 (H) 04/03/2017   Lab Results  Component Value Date   NVBTYOMA00 459 09/03/2017   Lab Results  Component Value Date   TSH 2.510 09/03/2017    ASSESSMENT AND PLAN 85 y.o. year old female   has a past medical history of Anxiety, Arthritis, Coronary artery disease, Dementia ([place]), Depression, Diabetes mellitus without complication ([place]), Hypertension, NSTEMI (non-ST elevated myocardial infarction) ([place]), Ringing in ears, and TIA (transient ischemic attack). here with:  1.  Memory loss -Decline in memory score since last seen, 14/30, also 6 pound weight loss -Continue Namzaric, but will monitor weight, may have to stop Aricept, have encouraged her to consider ensure or boost  -Does have underlying depression, is treated by PCP with Lexapro 10 mg daily, considered Lynch increase, at max Lynch for elderly patients, has tried Zoloft, considered Celexa, but has cardiac issues (AFIB), if does not improve, let us know, may consider switch to Effexor or even Remeron (to help with appetite) -I recommended she no longer drive a car -Follow-up in 6 months or sooner if needed  I spent 15 minutes with the patient. 50% of this time was spent discussing her plan of care.  Butler Denmark, AGNP-C, DNP 10/13/2019, 9:43 AM [facility] [address] [place], [place] [address] [phone]

## 2019-10-13 ENCOUNTER — Ambulatory Visit: Payer: Medicare HMO | Admitting: Neurology

## 2019-10-13 ENCOUNTER — Other Ambulatory Visit: Payer: Self-pay

## 2019-10-13 ENCOUNTER — Telehealth: Payer: Self-pay | Admitting: *Deleted

## 2019-10-13 ENCOUNTER — Encounter: Payer: Self-pay | Admitting: Neurology

## 2019-10-13 VITALS — BP 118/64 | HR 98 | Temp 97.6°F | Ht 66.0 in | Wt 132.6 lb

## 2019-10-13 DIAGNOSIS — G3184 Mild cognitive impairment, so stated: Secondary | ICD-10-CM

## 2019-10-13 DIAGNOSIS — F0150 Vascular dementia without behavioral disturbance: Secondary | ICD-10-CM | POA: Diagnosis not present

## 2019-10-13 MED ORDER — ESCITALOPRAM OXALATE 20 MG PO TABS: 20.0000 mg | ORAL_TABLET | Freq: Every day | ORAL | 1 refills | Status: DC

## 2019-10-13 NOTE — Patient Instructions (Addendum)
Continue Namzaric, but will monitor weight, in the last year 6 lbs weight loss   Try to eat 3 meals a day, also try Ensure or Boost   No driving at this point   Return in 6 months

## 2019-10-13 NOTE — Telephone Encounter (Signed)
LMVM for pharmacy [facility] to cancel [facility] 20mg  prescription that was done today.  Proceed with 10mg  tablets.

## 2020-02-07 DIAGNOSIS — F419 Anxiety disorder, unspecified: Secondary | ICD-10-CM | POA: Diagnosis not present

## 2020-02-07 DIAGNOSIS — I25118 Atherosclerotic heart disease of native coronary artery with other forms of angina pectoris: Secondary | ICD-10-CM | POA: Diagnosis not present

## 2020-02-07 DIAGNOSIS — F0280 Dementia in other diseases classified elsewhere without behavioral disturbance: Secondary | ICD-10-CM | POA: Diagnosis not present

## 2020-02-07 DIAGNOSIS — E538 Deficiency of other specified B group vitamins: Secondary | ICD-10-CM | POA: Diagnosis not present

## 2020-02-07 DIAGNOSIS — Z139 Encounter for screening, unspecified: Secondary | ICD-10-CM | POA: Diagnosis not present

## 2020-02-07 DIAGNOSIS — E7800 Pure hypercholesterolemia, unspecified: Secondary | ICD-10-CM | POA: Diagnosis not present

## 2020-02-07 DIAGNOSIS — M1830 Unilateral post-traumatic osteoarthritis of first carpometacarpal joint, unspecified hand: Secondary | ICD-10-CM | POA: Diagnosis not present

## 2020-02-07 DIAGNOSIS — E559 Vitamin D deficiency, unspecified: Secondary | ICD-10-CM | POA: Diagnosis not present

## 2020-02-07 DIAGNOSIS — E1122 Type 2 diabetes mellitus with diabetic chronic kidney disease: Secondary | ICD-10-CM | POA: Diagnosis not present

## 2020-02-07 DIAGNOSIS — I10 Essential (primary) hypertension: Secondary | ICD-10-CM | POA: Diagnosis not present

## 2020-02-07 DIAGNOSIS — F329 Major depressive disorder, single episode, unspecified: Secondary | ICD-10-CM | POA: Diagnosis not present

## 2020-04-17 ENCOUNTER — Ambulatory Visit: Payer: Medicare HMO | Admitting: Neurology

## 2020-04-17 ENCOUNTER — Encounter: Payer: Self-pay | Admitting: Neurology

## 2020-04-17 VITALS — BP 122/84 | HR 83 | Ht 66.0 in | Wt 135.0 lb

## 2020-04-17 DIAGNOSIS — F0150 Vascular dementia without behavioral disturbance: Secondary | ICD-10-CM

## 2020-04-17 DIAGNOSIS — G3184 Mild cognitive impairment, so stated: Secondary | ICD-10-CM

## 2020-04-17 MED ORDER — NAMZARIC 28-10 MG PO CP24: 1.0000 | ORAL_CAPSULE | Freq: Every day | ORAL | 4 refills | Status: DC

## 2020-04-17 NOTE — Patient Instructions (Signed)
Continue current medications  Continue follow-up with primary doctor  Return here as needed

## 2020-04-17 NOTE — Progress Notes (Signed)
PATIENT: Shawna Lynch DOB: Apr 08, 1935  REASON FOR VISIT: follow up HISTORY FROM: patient  HISTORY OF PRESENT ILLNESS: Today 04/17/20  HISTORY  Shawna Steichen Wrightis a 85 years old female, accompanied by her daughter [name], seen in refer by her primary care PA for evaluation of memory loss, headache, initial evaluation was on September 03, 2017.  I reviewed and summarized the referring note, she has past medical history of hypertension, diabetes, hyperlipidemia, coronary artery disease, memory loss, taking Namendaxr28 mg, donepezil 10 mg, since March 2018,  Retired from Psychologist, educational job, lives across the street from her daughter by herself, she was noted to have gradual onset memory loss since 2017, no longer able to attend her weekly get together with her friends regularly, her daughter has to take over financial part since 2017,  She also reported a history of long history of headaches, now she has different kind of headaches, pressure, symmetric, no significant light noise sensitivity,  She was started on Aricept and Namenda March 2017, tolerating the medication well, there was no significant improvement, he is also taking Zoloft 100 mg a day for depression,  Laboratory evaluation in 2018, CBC mild anemia hemoglobin of 11, BMP showed elevated creatinine 1.59  Personally reviewed CT head in November 2018, MRI of the brain 2017, generalized atrophy, supratentorium small vessel disease.  UPDATE March 09 2018:YYLaboratory evaluation in December 2018 showed normal negative vitamin B12, ESR, TSH, C-reactive protein, folic acid, RPR,  She is with her daughter at visit. 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 She is currently on Namzaric without side effects. Memory score is stable she continues to live independently. She can feed and dress and bathe herself. She does her laundry. Daughter checks on her frequently she continues to drive short distances and places where she knows. She has not had any accidents. She returns for reevaluation  Update April 07, 2019 RS:WNIO MMSE was 23/30,she remains on [place] daily. She thinks her memory is stable. She lives alone. She is able to do her ADLs. Daughter is across the street. She drives a car okay, only short distances. Needs help with housework. She likes to eat out, not much cooking. She has a good appetite. Her daughter puts her medications in pill box. Overall health has been good. She lives to watch TV, crossword puzzles, reads the paper. She has been depressed, her medication was adjusted, switched from Zoloft to Lexapro. She did have a fall, sprain to left wrist, wears a brace.  Update October 13, 2019 SS: Here with her daughter, she continues to live alone, but her daughter lives across the road.  She enjoys laying in the bed, resting, she admits to being "lazy", her daughter does her housework, Medical sales representative, she could do it, but doesn't want to.  She does not do any cooking, her daughter keeps food in the house, not much appetite, she does remain depressed, is on Lexapro 10 mg.  She sleeps well at night, she has not been driving.  In the last year she has lost 6 pounds, sometimes forgets to eat, daughter has noticed decline in memory, forgets to flush the toilet at times. She misses being able to get out, is getting her first Covid vaccine tonight.  Update April 17, 2020 SS:  Here today for follow-up accompanied by her daughter, continues to live at home, her daughter lives across the street, she now has a caregiver in the home m-f 9-5 for supervision.  Memory continues to have gradual decline, has repetitive questioning.  Her  daughter increased the Lexapro to 20 mg daily, has noted significant improvement in mood.  Her daughter does the household activities, and provides meals.  Weight is stable, is up 3 lbs at 135.  Is not very active, watches TV.  No longer drives. She thinks she is doing well, "I am not going down without a fight".  Remains on Namzaric.  REVIEW OF SYSTEMS: Out of a complete 14 system review of symptoms, the patient complains only of the following symptoms, and all other reviewed systems are negative.  Memory loss  ALLERGIES: Allergies  Allergen Reactions  . Penicillins Hives  . Sulfa Antibiotics Rash  . Codeine Rash    HOME MEDICATIONS: Outpatient Medications Prior to Visit  Medication Sig Dispense Refill  . aspirin EC 81 MG EC tablet Take 1 tablet (81 mg total) by mouth daily. 30 tablet   . atorvastatin (LIPITOR) 80 MG tablet Take 1 tablet (80 mg total) by mouth daily at 6 PM. 90 tablet 1  . carvedilol (COREG) 25 MG tablet Take 12.5 mg by mouth 2 (two) times daily with a meal.    . cholecalciferol (VITAMIN D) 1000 units tablet Take 1,000 Units by mouth daily.    . clopidogrel (PLAVIX) 75 MG tablet TAKE 1 TABLET EVERY DAY WITH BREAKFAST 90 tablet 1  . Dulaglutide (TRULICITY [place]) Inject 3 mg into the skin once a week.     [name] FISH OIL-BORAGE-FLAX-SAFFLOWER PO Take 1 capsule by mouth daily.    . isosorbide mononitrate (IMDUR) 60 MG 24 hr tablet TAKE 1 TABLET EVERY DAY 90 tablet 1  . Memantine HCl-Donepezil HCl (NAMZARIC) 28-10 MG CP24 Take 1 capsule by mouth daily. 90 capsule 4  . metFORMIN (GLUCOPHAGE) 500 MG tablet Take 500 mg by mouth 2 (two) times daily.    . nitroGLYCERIN (NITROSTAT) 0.4 MG SL tablet Place 1 tablet (0.4 mg total) under the tongue every 5 (five) minutes x 3 doses as needed for chest pain. 25 tablet 2  . omega-3 acid ethyl esters (LOVAZA) 1 g capsule Take 1 capsule (1 g total) by mouth 2 (two) times daily. 180 capsule 3  . pioglitazone (ACTOS) 45 MG tablet Take 45 mg by  mouth daily.    . traMADol (ULTRAM) 50 MG tablet Take 50 mg by mouth as needed.    . vitamin B-12 (CYANOCOBALAMIN) 1000 MCG tablet Take 1,000 mcg by mouth daily.     No facility-administered medications prior to visit.    PAST MEDICAL HISTORY: Past Medical History:  Diagnosis Date  . Anxiety   . Arthritis    oa  . Coronary artery disease    04/06/17 PCI with DES to [facility], 90% in diag from LAD, nonobstructive disease in LAD, RCA. Normal EF.   [name] Dementia ([place])   . Depression   . Diabetes mellitus without complication ([place])   . Hypertension   . NSTEMI (non-ST elevated myocardial infarction) ([place])   . Ringing in ears   . TIA (transient ischemic attack)     PAST SURGICAL HISTORY: Past Surgical History:  Procedure Laterality Date  . ABDOMINAL HYSTERECTOMY    . CORONARY STENT INTERVENTION  04/06/2017  . CORONARY STENT INTERVENTION N/A 04/06/2017   Procedure: Coronary Stent Intervention;  Surgeon: Burt Knack,  Legrand Como, MD;  Location: [facility];  Service: Cardiovascular;  Laterality: N/A;  . LEFT HEART CATH AND CORONARY ANGIOGRAPHY N/A 04/06/2017   Procedure: Left Heart Cath and Coronary Angiography;  Surgeon: Sherren Mocha, MD;  Location: [facility];  Service: Cardiovascular;  Laterality: N/A;    FAMILY HISTORY: Family History  Problem Relation Age of Onset  . CAD Father   . Cancer Sister   . Hearing loss Mother   . Hypertension Mother     SOCIAL HISTORY: Social History   Socioeconomic History  . Marital status: Married    Spouse name: Not on file  . Number of children: 2  . Years of education: 10  . Highest education level: High school graduate  Occupational History  . Occupation: Retired  Tobacco Use  . Smoking status: Former Research scientist (life sciences)  . Smokeless tobacco: Never Used  . Tobacco comment: quit "20 years ago"  Vaping Use  . Vaping Use: Never used  Substance and Sexual Activity  . Alcohol use: No  . Drug use: No  . Sexual activity: Not on file  Other Topics  Concern  . Not on file  Social History Narrative   Lives alone but daughter is right across the street.   Right-handed.   No caffeine use.   Social Determinants of Health   Financial Resource Strain:   . Difficulty of Paying Living Expenses:   Food Insecurity:   . Worried About Charity fundraiser in the Last Year:   . Arboriculturist in the Last Year:   Transportation Needs:   . Film/video editor (Medical):   [name] Lack of Transportation (Non-Medical):   Physical Activity:   . Days of Exercise per Week:   . Minutes of Exercise per Session:   Stress:   . Feeling of Stress :   Social Connections:   . Frequency of Communication with Friends and Family:   . Frequency of Social Gatherings with Friends and Family:   . Attends Religious Services:   . Active Member of Clubs or Organizations:   . Attends Archivist Meetings:   [name] Marital Status:   Intimate Partner Violence:   . Fear of Current or Ex-Partner:   . Emotionally Abused:   [name] Physically Abused:   . Sexually Abused:    PHYSICAL EXAM  Vitals:   04/17/20 1042  BP: 122/84  Pulse: 83  Weight: 135 lb (61.2 kg)  Height: '5\' 6"'  (1.676 m)   Body mass index is 21.79 kg/m.  Generalized: Well developed, in no acute distress  MMSE - Mini Mental State Exam 04/17/2020 10/13/2019 04/07/2019  Not completed: - (No Data) -  Orientation to time 1 0 3  Orientation to Place '3 3 4  ' Registration '3 3 3  ' Attention/ Calculation 0 0 5  Recall 0 0 0  Language- name 2 objects '2 2 2  ' Language- repeat 1 0 1  Language- repeat-comments - she said ifs ands or buts -  Language- follow 3 step command '3 3 3  ' Language- follow 3 step command-comments - - -  Language- read & follow direction '1 1 1  ' Write a sentence '1 1 1  ' Copy design 0 1 1  Copy design-comments - - 8  Total score '15 14 24    ' Neurological examination  Mentation: Alert oriented to time, place, most history is provided by her daughter. Follows all commands speech and  language fluent Cranial nerve II-XII: Pupils were equal  round reactive to light. Extraocular movements were full, visual field were full on confrontational test. Facial sensation and strength were normal. Head turning and shoulder shrug  were normal and symmetric. Motor: The motor testing reveals 5 over 5 strength of all 4 extremities. Good symmetric motor tone is noted throughout.  Sensory: Sensory testing is intact to soft touch on all 4 extremities. No evidence of extinction is noted.  Coordination: Cerebellar testing reveals good finger-nose-finger and heel-to-shin bilaterally.  Gait and station: Gait is normal.  Reflexes: Deep tendon reflexes are symmetric and normal bilaterally.   DIAGNOSTIC DATA (LABS, IMAGING, TESTING) - I reviewed patient records, labs, notes, testing and imaging myself where available.  Lab Results  Component Value Date   WBC 5.5 08/18/2017   HGB 11.0 (L) 08/18/2017   HCT 34.2 (L) 08/18/2017   MCV 91.9 08/18/2017   PLT 194 08/18/2017      Component Value Date/Time   NA 141 08/18/2017 0852   NA 139 12/17/2012 1305   K 3.9 08/18/2017 0852   K 4.0 12/17/2012 1305   CL 106 08/18/2017 0852   CL 102 12/17/2012 1305   CO2 26 08/18/2017 0852   CO2 29 12/17/2012 1305   GLUCOSE 141 (H) 08/18/2017 0852   GLUCOSE 151 (H) 12/17/2012 1305   BUN 19 08/18/2017 0852   BUN 15 12/17/2012 1305   CREATININE 1.59 (H) 08/18/2017 0852   CREATININE 1.00 12/17/2012 1305   CALCIUM 9.7 08/18/2017 0852   CALCIUM 9.2 12/17/2012 1305   PROT 8.1 12/17/2012 1305   ALBUMIN 3.7 12/17/2012 1305   AST 23 12/17/2012 1305   ALT 17 12/17/2012 1305   ALKPHOS 54 12/17/2012 1305   BILITOT 0.4 12/17/2012 1305   GFRNONAA 30 (L) 08/18/2017 0852   GFRNONAA 55 (L) 12/17/2012 1305   GFRAA 34 (L) 08/18/2017 0852   GFRAA >60 12/17/2012 1305   No results found for: CHOL, HDL, LDLCALC, LDLDIRECT, TRIG, CHOLHDL Lab Results  Component Value Date   HGBA1C 7.7 (H) 04/03/2017   Lab Results   Component Value Date   WCBJSEGB15 176 09/03/2017   Lab Results  Component Value Date   TSH 2.510 09/03/2017    ASSESSMENT AND PLAN 85 y.o. year old female  has a past medical history of Anxiety, Arthritis, Coronary artery disease, Dementia ([place]), Depression, Diabetes mellitus without complication ([place]), Hypertension, NSTEMI (non-ST elevated myocardial infarction) ([place]), Ringing in ears, and TIA (transient ischemic attack). here with:  1.  Dementia -Memory overall stable, MMSE 15/30 -Continue Namzaric, weight is stable, refill sent for 1 year -Has underlying depression, daughter increased Lynch of Lexapro to 20 mg daily, reports significant improvement -Continue follow-up with PCP, likely require more supervision, follow-up at this office on an as-needed basis  I spent 30 minutes of face-to-face and non-face-to-face time with patient.  This included previsit chart review, lab review, study review, order entry, electronic health record documentation, patient education.  Butler Denmark, AGNP-C, DNP 04/17/2020, 10:52 AM [facility] [address], [place], [place] [address] [phone]

## 2020-06-13 DIAGNOSIS — I25118 Atherosclerotic heart disease of native coronary artery with other forms of angina pectoris: Secondary | ICD-10-CM | POA: Diagnosis not present

## 2020-06-13 DIAGNOSIS — Z23 Encounter for immunization: Secondary | ICD-10-CM | POA: Diagnosis not present

## 2020-06-13 DIAGNOSIS — E1122 Type 2 diabetes mellitus with diabetic chronic kidney disease: Secondary | ICD-10-CM | POA: Diagnosis not present

## 2020-06-13 DIAGNOSIS — E7800 Pure hypercholesterolemia, unspecified: Secondary | ICD-10-CM | POA: Diagnosis not present

## 2020-06-13 DIAGNOSIS — N183 Chronic kidney disease, stage 3 unspecified: Secondary | ICD-10-CM | POA: Diagnosis not present

## 2020-06-13 DIAGNOSIS — F419 Anxiety disorder, unspecified: Secondary | ICD-10-CM | POA: Diagnosis not present

## 2020-06-13 DIAGNOSIS — R350 Frequency of micturition: Secondary | ICD-10-CM | POA: Diagnosis not present

## 2020-06-13 DIAGNOSIS — D649 Anemia, unspecified: Secondary | ICD-10-CM | POA: Diagnosis not present

## 2020-06-13 DIAGNOSIS — F0280 Dementia in other diseases classified elsewhere without behavioral disturbance: Secondary | ICD-10-CM | POA: Diagnosis not present

## 2020-06-13 DIAGNOSIS — N1832 Chronic kidney disease, stage 3b: Secondary | ICD-10-CM | POA: Diagnosis not present

## 2020-06-13 DIAGNOSIS — E559 Vitamin D deficiency, unspecified: Secondary | ICD-10-CM | POA: Diagnosis not present

## 2020-06-13 DIAGNOSIS — I10 Essential (primary) hypertension: Secondary | ICD-10-CM | POA: Diagnosis not present

## 2020-06-13 DIAGNOSIS — E538 Deficiency of other specified B group vitamins: Secondary | ICD-10-CM | POA: Diagnosis not present

## 2020-06-18 DIAGNOSIS — Z9181 History of falling: Secondary | ICD-10-CM | POA: Diagnosis not present

## 2020-06-18 DIAGNOSIS — Z0000 Encounter for general adult medical examination without abnormal findings: Secondary | ICD-10-CM | POA: Diagnosis not present

## 2020-06-18 DIAGNOSIS — Z1331 Encounter for screening for depression: Secondary | ICD-10-CM | POA: Diagnosis not present

## 2020-06-18 DIAGNOSIS — E785 Hyperlipidemia, unspecified: Secondary | ICD-10-CM | POA: Diagnosis not present

## 2020-07-11 ENCOUNTER — Other Ambulatory Visit: Payer: Self-pay | Admitting: Neurology

## 2020-08-13 ENCOUNTER — Observation Stay (HOSPITAL_COMMUNITY)
Admission: EM | Admit: 2020-08-13 | Discharge: 2020-08-14 | Disposition: A | Discharge status: Home / Self Care | Payer: Medicare HMO | Attending: Family Medicine | Admitting: Family Medicine

## 2020-08-13 ENCOUNTER — Other Ambulatory Visit: Payer: Self-pay

## 2020-08-13 ENCOUNTER — Emergency Department (HOSPITAL_COMMUNITY): Payer: Medicare HMO

## 2020-08-13 DIAGNOSIS — Z7902 Long term (current) use of antithrombotics/antiplatelets: Secondary | ICD-10-CM | POA: Insufficient documentation

## 2020-08-13 DIAGNOSIS — Z7982 Long term (current) use of aspirin: Secondary | ICD-10-CM | POA: Insufficient documentation

## 2020-08-13 DIAGNOSIS — F0390 Unspecified dementia without behavioral disturbance: Secondary | ICD-10-CM | POA: Insufficient documentation

## 2020-08-13 DIAGNOSIS — Z20822 Contact with and (suspected) exposure to covid-19: Secondary | ICD-10-CM | POA: Diagnosis not present

## 2020-08-13 DIAGNOSIS — E1159 Type 2 diabetes mellitus with other circulatory complications: Secondary | ICD-10-CM | POA: Insufficient documentation

## 2020-08-13 DIAGNOSIS — I2510 Atherosclerotic heart disease of native coronary artery without angina pectoris: Secondary | ICD-10-CM | POA: Diagnosis not present

## 2020-08-13 DIAGNOSIS — G459 Transient cerebral ischemic attack, unspecified: Principal | ICD-10-CM | POA: Diagnosis present

## 2020-08-13 DIAGNOSIS — E1165 Type 2 diabetes mellitus with hyperglycemia: Secondary | ICD-10-CM | POA: Insufficient documentation

## 2020-08-13 DIAGNOSIS — Z79899 Other long term (current) drug therapy: Secondary | ICD-10-CM | POA: Diagnosis not present

## 2020-08-13 DIAGNOSIS — R4781 Slurred speech: Secondary | ICD-10-CM | POA: Diagnosis not present

## 2020-08-13 DIAGNOSIS — Z8673 Personal history of transient ischemic attack (TIA), and cerebral infarction without residual deficits: Secondary | ICD-10-CM | POA: Diagnosis not present

## 2020-08-13 DIAGNOSIS — Z955 Presence of coronary angioplasty implant and graft: Secondary | ICD-10-CM | POA: Diagnosis not present

## 2020-08-13 DIAGNOSIS — Z87891 Personal history of nicotine dependence: Secondary | ICD-10-CM | POA: Diagnosis not present

## 2020-08-13 DIAGNOSIS — Z794 Long term (current) use of insulin: Secondary | ICD-10-CM | POA: Insufficient documentation

## 2020-08-13 DIAGNOSIS — I252 Old myocardial infarction: Secondary | ICD-10-CM | POA: Insufficient documentation

## 2020-08-13 DIAGNOSIS — R531 Weakness: Secondary | ICD-10-CM | POA: Diagnosis not present

## 2020-08-13 DIAGNOSIS — R404 Transient alteration of awareness: Secondary | ICD-10-CM | POA: Diagnosis not present

## 2020-08-13 DIAGNOSIS — I10 Essential (primary) hypertension: Secondary | ICD-10-CM | POA: Insufficient documentation

## 2020-08-13 DIAGNOSIS — R29810 Facial weakness: Secondary | ICD-10-CM | POA: Diagnosis not present

## 2020-08-13 LAB — GLUCOSE, CAPILLARY: Glucose-Capillary: 162 mg/dL — ABNORMAL HIGH (ref 70–99)

## 2020-08-13 LAB — ETHANOL: Alcohol, Ethyl (B): <10 mg/dL (ref <10)

## 2020-08-13 LAB — I-STAT CHEM 8, ED
BUN: 14 mg/dL (ref 8–23)
Calcium, Ion: 1.14 mmol/L — ABNORMAL LOW (ref 1.15–1.40)
Chloride: 98 mmol/L (ref 98–111)
Creatinine, Ser: 1.2 mg/dL — ABNORMAL HIGH (ref 0.44–1.00)
Glucose, Bld: 300 mg/dL — ABNORMAL HIGH (ref 70–99)
HCT: 37 % (ref 36.0–46.0)
Hemoglobin: 12.6 g/dL (ref 12.0–15.0)
Potassium: 3.9 mmol/L (ref 3.5–5.1)
Sodium: 137 mmol/L (ref 135–145)
TCO2: 24 mmol/L (ref 22–32)

## 2020-08-13 LAB — CBC
HCT: 38.9 % (ref 36.0–46.0)
Hemoglobin: 12.9 g/dL (ref 12.0–15.0)
MCH: 30.6 pg (ref 26.0–34.0)
MCHC: 33.2 g/dL (ref 30.0–36.0)
MCV: 92.4 fL (ref 80.0–100.0)
Platelets: 205 10*3/uL (ref 150–400)
RBC: 4.21 MIL/uL (ref 3.87–5.11)
RDW: 12.3 % (ref 11.5–15.5)
WBC: 8.4 10*3/uL (ref 4.0–10.5)
nRBC: 0 % (ref 0.0–0.2)

## 2020-08-13 LAB — COMPREHENSIVE METABOLIC PANEL
ALT: 29 U/L (ref 0–44)
AST: 30 U/L (ref 15–41)
Albumin: 3.5 g/dL (ref 3.5–5.0)
Alkaline Phosphatase: 49 U/L (ref 38–126)
Anion gap: 12 (ref 5–15)
BUN: 12 mg/dL (ref 8–23)
CO2: 26 mmol/L (ref 22–32)
Calcium: 9.3 mg/dL (ref 8.9–10.3)
Chloride: 99 mmol/L (ref 98–111)
Creatinine, Ser: 1.24 mg/dL — ABNORMAL HIGH (ref 0.44–1.00)
GFR, Estimated: 43 mL/min — ABNORMAL LOW (ref >60)
Glucose, Bld: 296 mg/dL — ABNORMAL HIGH (ref 70–99)
Potassium: 3.9 mmol/L (ref 3.5–5.1)
Sodium: 137 mmol/L (ref 135–145)
Total Bilirubin: 0.4 mg/dL (ref 0.3–1.2)
Total Protein: 6.5 g/dL (ref 6.5–8.1)

## 2020-08-13 LAB — DIFFERENTIAL
Abs Immature Granulocytes: 0.03 10*3/uL (ref 0.00–0.07)
Basophils Absolute: 0.1 10*3/uL (ref 0.0–0.1)
Basophils Relative: 1 %
Eosinophils Absolute: 0.2 10*3/uL (ref 0.0–0.5)
Eosinophils Relative: 2 %
Immature Granulocytes: 0 %
Lymphocytes Relative: 25 %
Lymphs Abs: 2.1 10*3/uL (ref 0.7–4.0)
Monocytes Absolute: 0.8 10*3/uL (ref 0.1–1.0)
Monocytes Relative: 9 %
Neutro Abs: 5.3 10*3/uL (ref 1.7–7.7)
Neutrophils Relative %: 63 %

## 2020-08-13 LAB — HEMOGLOBIN A1C
Hgb A1c MFr Bld: 8.9 % — ABNORMAL HIGH (ref 4.8–5.6)
Mean Plasma Glucose: 208.73 mg/dL

## 2020-08-13 LAB — LIPID PANEL
Cholesterol: 109 mg/dL (ref 0–200)
HDL: 44 mg/dL (ref >40)
LDL Cholesterol: 23 mg/dL (ref 0–99)
Total CHOL/HDL Ratio: 2.5 RATIO
Triglycerides: 211 mg/dL — ABNORMAL HIGH (ref <150)
VLDL: 42 mg/dL — ABNORMAL HIGH (ref 0–40)

## 2020-08-13 LAB — PROTIME-INR
INR: 1 (ref 0.8–1.2)
Prothrombin Time: 13.1 seconds (ref 11.4–15.2)

## 2020-08-13 LAB — URINALYSIS, ROUTINE W REFLEX MICROSCOPIC
Bilirubin Urine: NEGATIVE
Glucose, UA: 150 mg/dL — AB
Hgb urine dipstick: NEGATIVE
Ketones, ur: NEGATIVE mg/dL
Nitrite: POSITIVE — AB
Protein, ur: NEGATIVE mg/dL
Specific Gravity, Urine: 1.016 (ref 1.005–1.030)
WBC, UA: >50 WBC/hpf — ABNORMAL HIGH (ref 0–5)
pH: 6 (ref 5.0–8.0)

## 2020-08-13 LAB — CBG MONITORING, ED
Glucose-Capillary: 249 mg/dL — ABNORMAL HIGH (ref 70–99)
Glucose-Capillary: 171 mg/dL — ABNORMAL HIGH (ref 70–99)

## 2020-08-13 LAB — RESPIRATORY PANEL BY RT PCR (FLU A&B, COVID)
Influenza A by PCR: NEGATIVE
Influenza B by PCR: NEGATIVE
SARS Coronavirus 2 by RT PCR: NEGATIVE

## 2020-08-13 LAB — RAPID URINE DRUG SCREEN, HOSP PERFORMED
Amphetamines: NOT DETECTED
Barbiturates: NOT DETECTED
Benzodiazepines: NOT DETECTED
Cocaine: NOT DETECTED
Opiates: NOT DETECTED
Tetrahydrocannabinol: NOT DETECTED

## 2020-08-13 LAB — APTT: aPTT: 25 seconds (ref 24–36)

## 2020-08-13 MED ORDER — ASPIRIN 81 MG PO CHEW
81.0000 mg | CHEWABLE_TABLET | Freq: Every day | ORAL | Status: DC
  Administered 2020-08-13 – 2020-08-14 (×2): 81 mg via ORAL
  Filled 2020-08-13 (×2): qty 1

## 2020-08-13 MED ORDER — OMEGA-3-ACID ETHYL ESTERS 1 G PO CAPS
1.0000 g | ORAL_CAPSULE | Freq: Two times a day (BID) | ORAL | Status: DC
  Administered 2020-08-13 – 2020-08-14 (×2): 1 g via ORAL
  Filled 2020-08-13 (×3): qty 1

## 2020-08-13 MED ORDER — ESCITALOPRAM OXALATE 10 MG PO TABS
10.0000 mg | ORAL_TABLET | Freq: Every day | ORAL | Status: DC
  Administered 2020-08-14: 10 mg via ORAL
  Filled 2020-08-13: qty 1

## 2020-08-13 MED ORDER — CLOPIDOGREL BISULFATE 75 MG PO TABS
75.0000 mg | ORAL_TABLET | Freq: Every day | ORAL | Status: DC
  Administered 2020-08-14: 75 mg via ORAL
  Filled 2020-08-13: qty 1

## 2020-08-13 MED ORDER — INSULIN GLARGINE 100 UNIT/ML ~~LOC~~ SOLN
10.0000 [IU] | Freq: Every day | SUBCUTANEOUS | Status: DC
  Filled 2020-08-13: qty 0.1

## 2020-08-13 MED ORDER — METFORMIN HCL 500 MG PO TABS: 500.0000 mg | ORAL_TABLET | Freq: Every day | ORAL | Status: DC

## 2020-08-13 MED ORDER — INSULIN GLARGINE 100 UNIT/ML ~~LOC~~ SOLN
5.0000 [IU] | Freq: Every day | SUBCUTANEOUS | Status: DC
  Administered 2020-08-13: 5 [IU] via SUBCUTANEOUS
  Filled 2020-08-13 (×4): qty 0.05

## 2020-08-13 MED ORDER — ACETAMINOPHEN 325 MG PO TABS: 650.0000 mg | ORAL_TABLET | Freq: Four times a day (QID) | ORAL | Status: DC | PRN

## 2020-08-13 MED ORDER — ACETAMINOPHEN 650 MG RE SUPP: 650.0000 mg | Freq: Four times a day (QID) | RECTAL | Status: DC | PRN

## 2020-08-13 MED ORDER — DULAGLUTIDE 3 MG/0.5ML ~~LOC~~ SOAJ
3.0000 mg | SUBCUTANEOUS | Status: DC
Start: 2020-08-13 — End: 2020-08-13

## 2020-08-13 MED ORDER — ATORVASTATIN CALCIUM 80 MG PO TABS
80.0000 mg | ORAL_TABLET | Freq: Every day | ORAL | Status: DC
  Administered 2020-08-13 – 2020-08-14 (×2): 80 mg via ORAL
  Filled 2020-08-13 (×2): qty 1

## 2020-08-13 MED ORDER — VITAMIN D 25 MCG (1000 UNIT) PO TABS
1000.0000 [IU] | ORAL_TABLET | Freq: Every day | ORAL | Status: DC
  Administered 2020-08-14: 1000 [IU] via ORAL
  Filled 2020-08-13: qty 1

## 2020-08-13 MED ORDER — VITAMIN B-12 1000 MCG PO TABS
1000.0000 ug | ORAL_TABLET | Freq: Every day | ORAL | Status: DC
  Administered 2020-08-14: 1000 ug via ORAL
  Filled 2020-08-13: qty 1

## 2020-08-13 MED ORDER — INSULIN ASPART 100 UNIT/ML ~~LOC~~ SOLN
0.0000 [IU] | Freq: Three times a day (TID) | SUBCUTANEOUS | Status: DC
  Administered 2020-08-13: 2 [IU] via SUBCUTANEOUS
  Administered 2020-08-14: 1 [IU] via SUBCUTANEOUS
  Administered 2020-08-14: 2 [IU] via SUBCUTANEOUS
  Administered 2020-08-14: 3 [IU] via SUBCUTANEOUS

## 2020-08-13 NOTE — Progress Notes (Signed)
Called by teaching service to see if Plavix 75mg  po qd and ASA 81mg  po qd could be restarted now. NP spoke with Dr. Cheral Marker and yes it can be restarted. This was called back to Dr. Manus Rudd.  Clance Boll, NP Neuro

## 2020-08-13 NOTE — ED Provider Notes (Signed)
[place] EMERGENCY DEPARTMENT Provider Note   CSN: [id number] Arrival date & time: 08/13/20  1226  An emergency department physician performed an initial assessment on this suspected stroke patient at 1230.  History Chief Complaint  Patient presents with  . Code Stroke    Shawna Lynch is a 85 y.o. female.  HPI      85yo female with history of TIA, htn, DM, CAD, dementia, presents as a Code Stroke.  LNW 11AM.  Preacher and caregiver noted right sided facial droop and weakness as well as difficulty speaking.  Weakness, droop and dysarthria noted by EMS on arrival.  No other recent illness, eating ok, no fevers/cough/urinary symptoms, vomiting, diarrhea, bloody stool.   Past Medical History:  Diagnosis Date  . Anxiety   . Arthritis    oa  . Coronary artery disease    04/06/17 PCI with DES to [facility], 90% in diag from LAD, nonobstructive disease in LAD, RCA. Normal EF.   [name] Dementia ([place])   . Depression   . Diabetes mellitus without complication ([place])   . Hypertension   . NSTEMI (non-ST elevated myocardial infarction) ([place])   . Ringing in ears   . TIA (transient ischemic attack)     Patient Active Problem List   Diagnosis Date Noted  . TIA (transient ischemic attack) 08/13/2020  . Mild cognitive impairment 03/09/2018  . Pruritus 03/09/2018  . CAD S/P percutaneous coronary angioplasty 04/27/2017  . Dementia ([place]) 04/27/2017  . Dyslipidemia 04/07/2017  . Non-insulin treated type 2 diabetes mellitus ([place]) 04/06/2017  . Essential hypertension 04/06/2017  . History of non-ST elevation myocardial infarction (NSTEMI) 04/03/2017    Past Surgical History:  Procedure Laterality Date  . ABDOMINAL HYSTERECTOMY    . CORONARY STENT INTERVENTION  04/06/2017  . CORONARY STENT INTERVENTION N/A 04/06/2017   Procedure: Coronary Stent Intervention;  Surgeon: Sherren Mocha, MD;  Location: [facility];  Service: Cardiovascular;  Laterality: N/A;  . LEFT HEART  CATH AND CORONARY ANGIOGRAPHY N/A 04/06/2017   Procedure: Left Heart Cath and Coronary Angiography;  Surgeon: Sherren Mocha, MD;  Location: [facility];  Service: Cardiovascular;  Laterality: N/A;     OB History   No obstetric history on file.     Family History  Problem Relation Age of Onset  . CAD Father   . Cancer Sister   . Hearing loss Mother   . Hypertension Mother     Social History   Tobacco Use  . Smoking status: Former Research scientist (life sciences)  . Smokeless tobacco: Never Used  . Tobacco comment: quit "20 years ago"  Vaping Use  . Vaping Use: Never used  Substance Use Topics  . Alcohol use: No  . Drug use: No    Home Medications Prior to Admission medications   Medication Sig Start Date End Date Taking? Authorizing Provider  aspirin EC 81 MG EC tablet Take 1 tablet (81 mg total) by mouth daily. 04/07/17  Yes [name], NP  atorvastatin (LIPITOR) 80 MG tablet Take 1 tablet (80 mg total) by mouth daily at 6 PM. 04/07/17  Yes [name], NP  carvedilol (COREG) 25 MG tablet Take 12.5 mg by mouth 2 (two) times daily with a meal.   Yes [provider]  cholecalciferol (VITAMIN D) 1000 units tablet Take 1,000 Units by mouth daily.   Yes [provider]  clopidogrel (PLAVIX) 75 MG tablet TAKE 1 TABLET EVERY DAY WITH BREAKFAST Patient taking differently: Take 75  mg by mouth daily.  01/12/19  Yes Minus Breeding, MD  Dulaglutide (TRULICITY [place]) Inject 3 mg into the skin every Monday.    Yes [provider]  escitalopram (LEXAPRO) 10 MG tablet Take 10 mg by mouth daily. 06/25/20  Yes [provider]  isosorbide mononitrate (IMDUR) 60 MG 24 hr tablet TAKE 1 TABLET EVERY DAY Patient taking differently: Take 60 mg by mouth daily.  01/12/19  Yes Minus Breeding, MD  LANTUS SOLOSTAR 100 UNIT/ML Solostar Pen Inject 10 Units into the skin at bedtime. 06/17/20  Yes [provider]  metFORMIN (GLUCOPHAGE) 500 MG tablet Take 500 mg by mouth  daily.  08/20/19  Yes [provider]  nitroGLYCERIN (NITROSTAT) 0.4 MG SL tablet Place 1 tablet (0.4 mg total) under the tongue every 5 (five) minutes x 3 doses as needed for chest pain. Patient taking differently: Place 0.4 mg under the tongue every 5 (five) minutes as needed for chest pain (max 3 doses).  04/07/17  Yes [name], NP  omega-3 acid ethyl esters (LOVAZA) 1 g capsule Take 1 capsule (1 g total) by mouth 2 (two) times daily. 04/27/17  Yes Kilroy, Luke K, PA-C  vitamin B-12 (CYANOCOBALAMIN) 1000 MCG tablet Take 1,000 mcg by mouth daily.   Yes [provider]  Memantine HCl-Donepezil HCl (NAMZARIC) 28-10 MG CP24 Take 1 capsule by mouth daily. Patient not taking: Reported on 08/13/2020 04/17/20   Suzzanne Cloud, NP    Allergies    Penicillins, Sulfa antibiotics, Vicodin [hydrocodone-acetaminophen], and Codeine  Review of Systems   Review of Systems  Constitutional: Negative for fever.  HENT: Negative for sore throat.   Eyes: Negative for visual disturbance.  Respiratory: Negative for cough and shortness of breath.   Cardiovascular: Negative for chest pain.  Gastrointestinal: Negative for abdominal pain, nausea and vomiting.  Genitourinary: Negative for difficulty urinating.  Musculoskeletal: Negative for back pain and neck pain.  Skin: Negative for rash.  Neurological: Positive for facial asymmetry, speech difficulty, weakness and numbness. Negative for syncope and headaches.    Physical Exam Updated Vital Signs BP (!) 148/86 (BP Location: Right Arm)   Pulse 95   Temp 97.7 F (36.5 C) (Oral)   Resp 18   Wt 63.8 kg   SpO2 95%   BMI 22.70 kg/m   Physical Exam Vitals and nursing note reviewed.  Constitutional:      General: She is not in acute distress.    Appearance: She is well-developed. She is not diaphoretic.  HENT:     Head: Normocephalic and atraumatic.  Eyes:     General: No visual field deficit.    Conjunctiva/sclera:  Conjunctivae normal.  Cardiovascular:     Rate and Rhythm: Normal rate and regular rhythm.     Heart sounds: Normal heart sounds. No murmur heard.  No friction rub. No gallop.   Pulmonary:     Effort: Pulmonary effort is normal. No respiratory distress.     Breath sounds: Normal breath sounds. No wheezing or rales.  Abdominal:     General: There is no distension.     Palpations: Abdomen is soft.     Tenderness: There is no abdominal tenderness. There is no guarding.  Musculoskeletal:        General: No tenderness.     Cervical back: Normal range of motion.  Skin:    General: Skin is warm and dry.     Findings: No erythema or rash.  Neurological:  Mental Status: She is alert and oriented to person, place, and time.     GCS: GCS eye subscore is 4. GCS verbal subscore is 5. GCS motor subscore is 6.     Cranial Nerves: No cranial nerve deficit, dysarthria or facial asymmetry.     Sensory: Sensation is intact. No sensory deficit.     Motor: Motor function is intact. No weakness or pronator drift.     Coordination: Coordination is intact.     Comments: Some difficulty naming things (likely underlying dementia)     ED Results / Procedures / Treatments   Labs (all labs ordered are listed, but only abnormal results are displayed) Labs Reviewed  COMPREHENSIVE METABOLIC PANEL - Abnormal; Notable for the following components:      Result Value   Glucose, Bld 296 (*)    Creatinine, Ser 1.24 (*)    GFR, Estimated 43 (*)    All other components within normal limits  URINALYSIS, ROUTINE W REFLEX MICROSCOPIC - Abnormal; Notable for the following components:   APPearance HAZY (*)    Glucose, UA 150 (*)    Nitrite POSITIVE (*)    Leukocytes,Ua MODERATE (*)    WBC, UA >50 (*)    Bacteria, UA MANY (*)    All other components within normal limits  HEMOGLOBIN A1C - Abnormal; Notable for the following components:   Hgb A1c MFr Bld 8.9 (*)    All other components within normal limits    LIPID PANEL - Abnormal; Notable for the following components:   Triglycerides 211 (*)    VLDL 42 (*)    All other components within normal limits  GLUCOSE, CAPILLARY - Abnormal; Notable for the following components:   Glucose-Capillary 162 (*)    All other components within normal limits  CBG MONITORING, ED - Abnormal; Notable for the following components:   Glucose-Capillary 249 (*)    All other components within normal limits  I-STAT CHEM 8, ED - Abnormal; Notable for the following components:   Creatinine, Ser 1.20 (*)    Glucose, Bld 300 (*)    Calcium, Ion 1.14 (*)    All other components within normal limits  CBG MONITORING, ED - Abnormal; Notable for the following components:   Glucose-Capillary 171 (*)    All other components within normal limits  RESPIRATORY PANEL BY RT PCR (FLU A&B, COVID)  ETHANOL  CBC  DIFFERENTIAL  RAPID URINE DRUG SCREEN, HOSP PERFORMED  PROTIME-INR  APTT  CBC  BASIC METABOLIC PANEL    EKG None  Radiology CT HEAD CODE STROKE WO CONTRAST  Addendum Date: 08/13/2020   ADDENDUM REPORT: 08/13/2020 12:53 ADDENDUM: Findings discussed with Dr. Cheral Marker at 12:52 PM via telephone. Electronically Signed   By: Margaretha Sheffield MD   On: 08/13/2020 12:53   Result Date: 08/13/2020 CLINICAL DATA:  Code stroke. Neuro deficit, acute stroke suspected. Right-sided weakness and facial droop. EXAM: CT HEAD WITHOUT CONTRAST TECHNIQUE: Contiguous axial images were obtained from the base of the skull through the vertex without intravenous contrast. COMPARISON:  CT head August 18, 2017. FINDINGS: Brain: No evidence of acute large vascular territory infarction, hemorrhage, hydrocephalus, extra-axial collection or mass lesion/mass effect. There is similar patchy white matter hypoattenuation, which is nonspecific but most likely secondary to chronic microvascular ischemic disease. Similar generalized cerebral atrophy with ex vacuo ventricular dilation. Vascular: No  hyperdense vessel or unexpected calcification. Calcific atherosclerosis. Skull: No acute fracture. Sinuses/Orbits: No acute findings. Other: Small left mastoid effusion. ASPECTS [facility] Stroke  Program Early CT Score) total score (0-10 with 10 being normal): 10. IMPRESSION: 1. No evidence of acute intracranial abnormality.  ASPECTS is 10. 2. Chronic microvascular ischemic disease and generalized atrophy. Dr. Cheral Marker was paged at the time of dictation. Electronically Signed: By: Margaretha Sheffield MD On: 08/13/2020 12:45    Procedures Procedures (including critical care time)  Medications Ordered in ED Medications  atorvastatin (LIPITOR) tablet 80 mg (80 mg Oral Given 08/13/20 1813)  omega-3 acid ethyl esters (LOVAZA) capsule 1 g (1 g Oral Given 08/13/20 2209)  escitalopram (LEXAPRO) tablet 10 mg (has no administration in time range)  clopidogrel (PLAVIX) tablet 75 mg (has no administration in time range)  vitamin B-12 (CYANOCOBALAMIN) tablet 1,000 mcg (has no administration in time range)  cholecalciferol (VITAMIN D3) tablet 1,000 Units (has no administration in time range)  acetaminophen (TYLENOL) tablet 650 mg (has no administration in time range)    Or  acetaminophen (TYLENOL) suppository 650 mg (has no administration in time range)  insulin aspart (novoLOG) injection 0-9 Units (2 Units Subcutaneous Given 08/13/20 1716)  insulin glargine (LANTUS) injection 5 Units (5 Units Subcutaneous Given 08/13/20 2228)  aspirin chewable tablet 81 mg (81 mg Oral Given 08/13/20 1813)    ED Course  I have reviewed the triage vital signs and the nursing notes.  Pertinent labs & imaging results that were available during my care of the patient were reviewed by me and considered in my medical decision making (see chart for details).    MDM Rules/Calculators/A&P                         85yo female with history of TIA, htn, DM, CAD, dementia, presents as a Code Stroke for right sided weakness, facial  droop and difficulty speaking. CT head without acute findings. Mild hyperglycemia without DKA.    Symptoms noted by EMS and caregiver at home resolved on arrival> Will admit for TIA vs CVA work up.   Final Clinical Impression(s) / ED Diagnoses Final diagnoses:  TIA (transient ischemic attack)    Rx / DC Orders ED Discharge Orders    None       Gareth Morgan, MD 08/13/20 2349

## 2020-08-13 NOTE — ED Triage Notes (Addendum)
Pt brought to ED from home via EMS with c/o right sided facial droop, right sided weakness, and slurred speech. LKW 1100. Pt alert on arrival to ED, NAD. EDP and stroke team present on arrival, pt transported to CT.   EMS v/s: 130/72 90 HR 16 RR 97.5 temporal 351 CBG

## 2020-08-13 NOTE — Consult Note (Signed)
Referring Physician: Dr. Billy Fischer    Chief Complaint: Acute onset of right sided numbness, right facial droop and dysarthria  HPI: Shawna Lynch is an 85 y.o. female with a PMHx of DM, CAD s/p DES, HTN and prior TIA, presenting to the ED via EMS as a code stroke for right sided facial droop, right sided weakness and slurred speech. Her symptoms had resolved at the time of presentation to the ED, other than some confusion and speech deficits which may be her baseline due to diagnosis of Alzheimer dementia; NIHSS of 3 for these signs. Vitals on arrival: 130/72, 90 HR, 16 RR, 97.5 temporal, 351 CBG.   LSN: 1100 tPA Given: No: Acute deficits resolved with exam findings being most consistent with dementia NIHSS 3  Past Medical History:  Diagnosis Date  . Anxiety   . Arthritis    oa  . Coronary artery disease    04/06/17 PCI with DES to [facility], 90% in diag from LAD, nonobstructive disease in LAD, RCA. Normal EF.   [name] Dementia ([place])   . Depression   . Diabetes mellitus without complication ([place])   . Hypertension   . NSTEMI (non-ST elevated myocardial infarction) ([place])   . Ringing in ears   . TIA (transient ischemic attack)   Alzheimer dementia  Past Surgical History:  Procedure Laterality Date  . ABDOMINAL HYSTERECTOMY    . CORONARY STENT INTERVENTION  04/06/2017  . CORONARY STENT INTERVENTION N/A 04/06/2017   Procedure: Coronary Stent Intervention;  Surgeon: Sherren Mocha, MD;  Location: [facility];  Service: Cardiovascular;  Laterality: N/A;  . LEFT HEART CATH AND CORONARY ANGIOGRAPHY N/A 04/06/2017   Procedure: Left Heart Cath and Coronary Angiography;  Surgeon: Sherren Mocha, MD;  Location: [place] [facility];  Service: Cardiovascular;  Laterality: N/A;    Family History  Problem Relation Age of Onset  . CAD Father   . Cancer Sister   . Hearing loss Mother   . Hypertension Mother    Social History:  reports that she has quit smoking. She has never used smokeless tobacco.  She reports that she does not drink alcohol and does not use drugs.  Allergies:  Allergies  Allergen Reactions  . Penicillins Hives  . Sulfa Antibiotics Rash  . Codeine Rash    Home Medications:  No current facility-administered medications on file prior to encounter.   Current Outpatient Medications on File Prior to Encounter  Medication Sig Dispense Refill  . aspirin EC 81 MG EC tablet Take 1 tablet (81 mg total) by mouth daily. 30 tablet   . atorvastatin (LIPITOR) 80 MG tablet Take 1 tablet (80 mg total) by mouth daily at 6 PM. 90 tablet 1  . carvedilol (COREG) 25 MG tablet Take 12.5 mg by mouth 2 (two) times daily with a meal.    . cholecalciferol (VITAMIN D) 1000 units tablet Take 1,000 Units by mouth daily.    . clopidogrel (PLAVIX) 75 MG tablet TAKE 1 TABLET EVERY DAY WITH BREAKFAST (Patient taking differently: Take 75 mg by mouth daily. ) 90 tablet 1  . Dulaglutide (TRULICITY ) Inject 3 mg into the skin every Monday.     . escitalopram (LEXAPRO) 10 MG tablet Take 10 mg by mouth daily.    . isosorbide mononitrate (IMDUR) 60 MG 24 hr tablet TAKE 1 TABLET EVERY DAY (Patient taking differently: Take 60 mg by mouth daily. ) 90 tablet 1  . LANTUS SOLOSTAR 100 UNIT/ML Solostar Pen Inject 10 Units into the  skin at bedtime.    . metFORMIN (GLUCOPHAGE) 500 MG tablet Take 500 mg by mouth daily.     . nitroGLYCERIN (NITROSTAT) 0.4 MG SL tablet Place 1 tablet (0.4 mg total) under the tongue every 5 (five) minutes x 3 doses as needed for chest pain. (Patient taking differently: Place 0.4 mg under the tongue every 5 (five) minutes as needed for chest pain (max 3 doses). ) 25 tablet 2  . omega-3 acid ethyl esters (LOVAZA) 1 g capsule Take 1 capsule (1 g total) by mouth 2 (two) times daily. 180 capsule 3  . vitamin B-12 (CYANOCOBALAMIN) 1000 MCG tablet Take 1,000 mcg by mouth daily.    . Memantine HCl-Donepezil HCl (NAMZARIC) 28-10 MG CP24 Take 1 capsule by mouth daily. (Patient not taking:  Reported on 08/13/2020) 90 capsule 4     ROS: No complaints. Detailed ROS deferred due to acuity of presentation.   Physical Examination: Weight 63.8 kg.  HEENT: [place]/AT Lungs: Respirations unlabored Ext: No edema  Neurologic Examination: Mental Status: Alert, oriented to city but not state, year, day or month. Speech fluent with intact comprehension. Repetition intact. Able to name 2/5 common objects. No dysarthria. Confused. Non-agitated.  Cranial Nerves: II:  Visual fields intact. No extinction to DSS. PERRL.  III,IV, VI: No ptosis. EOMI. No nystagmus.  V,VII: Smile symmetric, facial temp sensation equal bilaterally VIII: Hearing intact to voice.  IX,X: No hypophonia XI: Symmetric XII: Midline tongue extension  Motor: Right : Upper extremity   5/5    Left:     Upper extremity   5/5  Lower extremity   5/5     Lower extremity   5/5 No pronator drift.  Sensory: Light touch intact throughout, bilaterally. No extinction to DSS.  Deep Tendon Reflexes:  1+ bilateral biceps and brachioradialis. 2+ patellae bilaterally 0 achilles bilaterally Toes downgoing.  Cerebellar: No ataxia with FNF bilaterally  Gait: Deferred   Results for orders placed or performed during the hospital encounter of 08/13/20 (from the past 48 hour(s))  CBG monitoring, ED     Status: Abnormal   Collection Time: 08/13/20 12:28 PM  Result Value Ref Range   Glucose-Capillary 249 (H) 70 - 99 mg/dL    Comment: Glucose reference range applies only to samples taken after fasting for at least 8 hours.   No results found.  Assessment: 85 y.o. female presenting as a Code Stroke after slurred speech and left facial droop was noted by family. LKN 1100.  1. Exam reveals sensory impairment on the right and cognitive deficits consistent with her known history of Alzheimer dementia.  2. CT head shows no evidence of acute intracranial abnormality.  ASPECTS is 10. Chronic microvascular ischemic disease and generalized  atrophy are noted. 3. Stroke Risk Factors -  DM, CAD, HTN and prior TIA  4. Has a drug eluting stent  Recommendations: 1. HgbA1c, fasting lipid panel 2. MRI, MRA of the brain without contrast 3. PT consult, OT consult, Speech consult 4. Echocardiogram 5. Carotid dopplers 6. Prophylactic therapy- Continue ASA and Plavix. (has drug eluting coronary stent in addition to her recurrent TIA this admission) 7. Risk factor modification 8. Telemetry monitoring 9. Frequent neuro checks 10. Modified permissive HTN protocol given advanced age. Correct SBP if > 180.  11. Continue memantine-donepezil    @Electronically  signed: Dr. Kerney Elbe  08/13/2020, 12:35 PM

## 2020-08-13 NOTE — Evaluation (Signed)
Physical Therapy Evaluation Patient Details Name: Shawna Lynch MRN: [id number] DOB: 1935-09-01 Today's Date: 08/13/2020   History of Present Illness  Pt is an 85 y/o female admitted secondary to R arm weakness and numbness, word finding difficulty, and R sided face numbness. CT negative. Thought to be from TIA. PMH includes HTN, DM, dementia, and CAD.   Clinical Impression  Pt admitted secondary to problem above with deficits below. Pt with dementia at baseline with notable memory deficits throughout session. Pt with instability initially, however, improved with distance. Educated about using DME at home initially to increase safety. Per daughter, pt has caregiver 5 days/week from 9-5. Daughter lives close by and can help as needed. Per daughter, does not feel like pt will need HH follow up. Will continue to follow acutely.     Follow Up Recommendations No PT follow up    Equipment Recommendations  None recommended by PT    Recommendations for Other Services       Precautions / Restrictions Precautions Precautions: Fall Restrictions Weight Bearing Restrictions: No      Mobility  Bed Mobility Overal bed mobility: Needs Assistance Bed Mobility: Supine to Sit;Sit to Supine     Supine to sit: Min assist Sit to supine: Supervision   General bed mobility comments: Min A for trunk elevation to come to sitting on stretcher.     Transfers Overall transfer level: Needs assistance Equipment used: 1 person hand held assist Transfers: Sit to/from Stand Sit to Stand: Min guard         General transfer comment: Min guard for safety.   Ambulation/Gait Ambulation/Gait assistance: Min guard Gait Distance (Feet): 125 Feet Assistive device: 1 person hand held assist Gait Pattern/deviations: Step-through pattern;Decreased stride length;Trunk flexed Gait velocity: Decreased   General Gait Details: Initial unsteadiness, however, balance improved with increased distance. Educated  about DME use at home to increase safety.   Stairs            Wheelchair Mobility    Modified Rankin (Stroke Patients Only)       Balance Overall balance assessment: Needs assistance Sitting-balance support: No upper extremity supported;Feet supported Sitting balance-Leahy Scale: Fair     Standing balance support: Single extremity supported;No upper extremity supported;During functional activity Standing balance-Leahy Scale: Fair Standing balance comment: Able to maintain static standing without UE support                              Pertinent Vitals/Pain Pain Assessment: No/denies pain    Home Living Family/patient expects to be discharged to:: Private residence Living Arrangements: Alone Available Help at Discharge: Family;Personal care attendant Type of Home: House Home Access: Level entry     Home Layout: One level Home Equipment: Walker - 2 wheels;Cane - single point;Shower seat Additional Comments: Has an aide from 9-5 5 days/week. Pt alone at night, however, daughter close by.     Prior Function Level of Independence: Needs assistance   Gait / Transfers Assistance Needed: Independent with ambulation  ADL's / Homemaking Assistance Needed: Aide assists with bathing and IADLs.         Hand Dominance        Extremity/Trunk Assessment   Upper Extremity Assessment Upper Extremity Assessment: Defer to OT evaluation    Lower Extremity Assessment Lower Extremity Assessment: Generalized weakness    Cervical / Trunk Assessment Cervical / Trunk Assessment: Kyphotic  Communication   Communication: No difficulties  Cognition  Arousal/Alertness: Awake/alert Behavior During Therapy: WFL for tasks assessed/performed Overall Cognitive Status: History of cognitive impairments - at baseline                                 General Comments: Dementia at baseline       General Comments General comments (skin integrity, edema,  etc.): Pt's daughter present during session.     Exercises     Assessment/Plan    PT Assessment Patient needs continued PT services  PT Problem List Decreased strength;Decreased balance;Decreased mobility;Decreased cognition;Decreased safety awareness       PT Treatment Interventions DME instruction;Gait training;Therapeutic activities;Therapeutic exercise;Functional mobility training;Balance training;Patient/family education    PT Goals (Current goals can be found in the Care Plan section)  Acute Rehab PT Goals Patient Stated Goal: to go home PT Goal Formulation: With patient Time For Goal Achievement: 08/27/20 Potential to Achieve Goals: Good    Frequency Min 3X/week   Barriers to discharge        Co-evaluation               AM-PAC PT "6 Clicks" Mobility  Outcome Measure Help needed turning from your back to your side while in a flat bed without using bedrails?: A Little Help needed moving from lying on your back to sitting on the side of a flat bed without using bedrails?: A Little Help needed moving to and from a bed to a chair (including a wheelchair)?: A Little Help needed standing up from a chair using your arms (e.g., wheelchair or bedside chair)?: A Little Help needed to walk in hospital room?: A Little Help needed climbing 3-5 steps with a railing? : A Little 6 Click Score: 18    End of Session Equipment Utilized During Treatment: Gait belt Activity Tolerance: Patient tolerated treatment well Patient left: in bed;with call bell/phone within reach;with family/visitor present (on stretcher in ED ) Nurse Communication: Mobility status PT Visit Diagnosis: Unsteadiness on feet (R26.81);Muscle weakness (generalized) (M62.81)    Time: 7544-9201 PT Time Calculation (min) (ACUTE ONLY): 17 min   Charges:   PT Evaluation $PT Eval Low Complexity: 1 Low          Shawna Lynch, DPT  Acute Rehabilitation Services  Pager: [phone] Office: [phone]   Shawna Lynch 08/13/2020, 4:50 PM

## 2020-08-13 NOTE — H&P (Addendum)
[facility] Admission History and Physical Service Pager: [phone]  Patient name: Shawna Lynch Medical record number: [id number] Date of birth: 1935-03-12 Age: 85 y.o. Gender: female  Primary Care Provider: Isaias Sakai, DO Consultants: Neuro Code Status: Full Preferred Emergency Contact: Daughter [name]  Chief Complaint: Right arm/facial numbness  Assessment and Plan: Shawna Lynch is a 85 y.o. female presenting with . PMH is significant for TIA, HTN, HLD, Diabetes, Dementia, CAD, NSTEMI.    Right-sided numbness and weakness of the face and arm Patient presented with numbness and weakness in right arm and face, and  for 1 day.  Currently hemodynamically stable.  Neuro exam normal.  CN 1-12 intact.  No weakness in face leg or arms.  BUN 15.  PT-13.1.  PTT-25.  CT head code stroke without contrast showed no evidence of acute intracranial abnormality.  It did note chronic micro vascular ischemic disease and generalized atrophy.  Of note patient does have history of TIA.  Patient on aspirin and Plavix prior to admission due to CAD with stent placement.  Likely TIA considering resolution of symptoms.  Less likely Meningitis or Encepholitis given normal CBC and normal vitals.  R/o UTI.   Passed speech swallow study. Will likely obtain ECHO to rule out Cardiogenic process, especially given history of NSTEMI. Neuro currently following.   - Admit to FPTS, Med-Tele, Attending Dr Thompson Grayer - Neuro following, appreciate recs -Awaiting MRI order pending neuro recs - UA pending - UDS pending - Continuous Pulse Ox - Vitals q4hr - Permissive hypertension - Neuro checks q2hr -Up with assistance -PT/OT eval and treat   HTN Patient currently normotensive.  Miost recent BP- 137/82.  On home Imdur 60 mg, Coreg 25 mg BID. - Hold home meds pending neuro recs -Continue permissive hypertension -Will restart home meds when applicable  CKD 3B EGFR on admission 43.   Baseline creatinine is approximately 1.3-1.5.  On admission creatinine is 1.24. -Holding home Metformin at this time -Avoid nephrotoxic agents   HLD Patient has history of HLD.  On Atorvastatin 80 mg at home. - Obtain Lipid Panel - Continue Atorvastatin 80 mg  Type 2 DM Last A1C-7.7 in 2018.  Had drug eluting stent placed in Mid-Circumflex.  On Lantus, metformin and trulicity at home. - Repeat A1C -Continuing Lantus but decreased from home rate to 5 units daily -Holding Metformin while hospitalized - Hold weekly Trulicity - sSSI   CAD NSTEMI in 2018.  On Aspirin 81 mg, Plavix 75 qd at home.   - Hold home medications pending Neuro recs -Reaching out to neuro APP regarding anticoagulation recommendations given patient has history of stents he needs to be on aspirin and Plavix   FEN/GI: Carb modified/Heart Healthy Prophylaxis: SCD's pending Neuro recs  Disposition: Med-Tele  History of Present Illness:  Shawna Lynch is a 85 y.o. female presenting with right arm and hand numbness.  This started today and has now resolved.  Also had reports of dysphasia.  Has had episodes of this in past.  Had previous CT that showed minor infarctions associated with dementia.  Symptoms have resolved, patient now speaking in full sentences and not hasving any hand or facial symptoms.  Review Of Systems: Per HPI with the following additions:   Review of Systems  Constitutional: Negative for chills and fever.  HENT: Negative for congestion and rhinorrhea.   Eyes: Negative for visual disturbance.  Respiratory: Negative for cough and shortness of breath.   Cardiovascular: Negative for  chest pain.  Gastrointestinal: Negative for constipation.  Endocrine: Negative for polyuria.  Genitourinary: Negative for difficulty urinating.  Neurological: Negative for dizziness and headaches.     Patient Active Problem List   Diagnosis Date Noted  . TIA (transient ischemic attack) 08/13/2020  . Mild  cognitive impairment 03/09/2018  . Pruritus 03/09/2018  . CAD S/P percutaneous coronary angioplasty 04/27/2017  . Dementia ([place]) 04/27/2017  . Dyslipidemia 04/07/2017  . Non-insulin treated type 2 diabetes mellitus ([place]) 04/06/2017  . Essential hypertension 04/06/2017  . History of non-ST elevation myocardial infarction (NSTEMI) 04/03/2017    Past Medical History: Past Medical History:  Diagnosis Date  . Anxiety   . Arthritis    oa  . Coronary artery disease    04/06/17 PCI with DES to [facility], 90% in diag from LAD, nonobstructive disease in LAD, RCA. Normal EF.   [name] Dementia ([place])   . Depression   . Diabetes mellitus without complication ([place])   . Hypertension   . NSTEMI (non-ST elevated myocardial infarction) ([place])   . Ringing in ears   . TIA (transient ischemic attack)     Past Surgical History: Past Surgical History:  Procedure Laterality Date  . ABDOMINAL HYSTERECTOMY    . CORONARY STENT INTERVENTION  04/06/2017  . CORONARY STENT INTERVENTION N/A 04/06/2017   Procedure: Coronary Stent Intervention;  Surgeon: Sherren Mocha, MD;  Location: [place] [facility];  Service: Cardiovascular;  Laterality: N/A;  . LEFT HEART CATH AND CORONARY ANGIOGRAPHY N/A 04/06/2017   Procedure: Left Heart Cath and Coronary Angiography;  Surgeon: Sherren Mocha, MD;  Location: [place] CV LAB;  Service: Cardiovascular;  Laterality: N/A;    Social History: Social History   Tobacco Use  . Smoking status: Former Research scientist (life sciences)  . Smokeless tobacco: Never Used  . Tobacco comment: quit "20 years ago"  Vaping Use  . Vaping Use: Never used  Substance Use Topics  . Alcohol use: No  . Drug use: No   Additional social history:  Please also refer to relevant sections of EMR.  Family History: Family History  Problem Relation Age of Onset  . CAD Father   . Cancer Sister   . Hearing loss Mother   . Hypertension Mother     Allergies and Medications: Allergies  Allergen Reactions  . Penicillins  Hives  . Sulfa Antibiotics Rash  . Codeine Rash   No current facility-administered medications on file prior to encounter.   Current Outpatient Medications on File Prior to Encounter  Medication Sig Dispense Refill  . aspirin EC 81 MG EC tablet Take 1 tablet (81 mg total) by mouth daily. 30 tablet   . atorvastatin (LIPITOR) 80 MG tablet Take 1 tablet (80 mg total) by mouth daily at 6 PM. 90 tablet 1  . carvedilol (COREG) 25 MG tablet Take 12.5 mg by mouth 2 (two) times daily with a meal.    . cholecalciferol (VITAMIN D) 1000 units tablet Take 1,000 Units by mouth daily.    . clopidogrel (PLAVIX) 75 MG tablet TAKE 1 TABLET EVERY DAY WITH BREAKFAST 90 tablet 1  . Dulaglutide (TRULICITY [place]) Inject 3 mg into the skin once a week.     [name] FISH OIL-BORAGE-FLAX-SAFFLOWER PO Take 1 capsule by mouth daily.    . isosorbide mononitrate (IMDUR) 60 MG 24 hr tablet TAKE 1 TABLET EVERY DAY 90 tablet 1  . Memantine HCl-Donepezil HCl (NAMZARIC) 28-10 MG CP24 Take 1 capsule by mouth daily. 90 capsule 4  . metFORMIN (GLUCOPHAGE) 500  MG tablet Take 500 mg by mouth 2 (two) times daily.    . nitroGLYCERIN (NITROSTAT) 0.4 MG SL tablet Place 1 tablet (0.4 mg total) under the tongue every 5 (five) minutes x 3 doses as needed for chest pain. 25 tablet 2  . omega-3 acid ethyl esters (LOVAZA) 1 g capsule Take 1 capsule (1 g total) by mouth 2 (two) times daily. 180 capsule 3  . pioglitazone (ACTOS) 45 MG tablet Take 45 mg by mouth daily.    . traMADol (ULTRAM) 50 MG tablet Take 50 mg by mouth as needed.    . vitamin B-12 (CYANOCOBALAMIN) 1000 MCG tablet Take 1,000 mcg by mouth daily.      Objective: BP (!) 144/83   Pulse 88   Temp 98.6 F (37 C) (Oral)   Resp 15   Wt 63.8 kg   SpO2 96%   BMI 22.70 kg/m  Exam:  Physical Exam Constitutional:      General: She is not in acute distress.    Appearance: Normal appearance. She is not ill-appearing.  HENT:     Head: Normocephalic and atraumatic.      Mouth/Throat:     Mouth: Mucous membranes are moist.  Cardiovascular:     Rate and Rhythm: Normal rate and regular rhythm.     Heart sounds: Normal heart sounds.     Comments: Weak pulses, good perfusion Pulmonary:     Effort: Pulmonary effort is normal.     Breath sounds: Normal breath sounds.  Abdominal:     General: Abdomen is flat. Bowel sounds are normal. There is no distension.     Palpations: Abdomen is soft.     Tenderness: There is no abdominal tenderness.  Musculoskeletal:        General: Normal range of motion.     Right lower leg: No edema.     Left lower leg: No edema.  Skin:    General: Skin is warm.     Capillary Refill: Capillary refill takes less than 2 seconds.  Neurological:     General: No focal deficit present.     Mental Status: She is alert.     Cranial Nerves: No cranial nerve deficit.     Sensory: No sensory deficit.     Motor: No weakness.     Comments: A & O x2 to person and place, but not time  Psychiatric:        Mood and Affect: Mood normal.        Behavior: Behavior normal.     Labs and Imaging: CBC BMET  Recent Labs  Lab 08/13/20 1233 08/13/20 1233 08/13/20 1238  WBC 8.4  --   --   HGB 12.9   < > 12.6  HCT 38.9   < > 37.0  PLT 205  --   --    < > = values in this interval not displayed.   Recent Labs  Lab 08/13/20 1233 08/13/20 1233 08/13/20 1238  NA 137   < > 137  K 3.9   < > 3.9  CL 99   < > 98  CO2 26  --   --   BUN 12   < > 14  CREATININE 1.24*   < > 1.20*  GLUCOSE 296*   < > 300*  CALCIUM 9.3  --   --    < > = values in this interval not displayed.     EKG: Normal Sinus  Delora Fuel, MD 08/13/2020, 2:02  PM PGY-1, [place] Intern pager: [phone], text pages welcome  FPTS Upper-Level Resident Addendum   I have independently interviewed and examined the patient. I have discussed the above with the original author and agree with their documentation. My edits for  correction/addition/clarification are in blue.Please see also any attending notes.   Gifford Shave, MD PGY-2, [facility] 08/13/2020 4:46 PM  [place] Service pager: [phone] (text pages welcome through [place])

## 2020-08-13 NOTE — Code Documentation (Signed)
Stroke Response Nurse Documentation Code Documentation  Shawna Lynch is a 85 y.o. female arriving to [place] ED via [facility] on 08/13/2020 with past medical hx of TIA, HTN, Diabetes, Dementia, CAD, NSTEMI. Code stroke was activated by EMS. Patient from home where she was LKW at 1100. Pt started complaining of not feeling right and the daughter noted right arm weakness with speech changes. Called EMS and they activated in the field. On aspirin 81 mg daily and clopidogrel 75 mg daily.    Stroke team at the bedside on patient arrival. Labs drawn and patient cleared for CT by Dr. Billy Fischer. Patient to CT with team. NIHSS 3, see documentation for details and code stroke times. Patient with disoriented and Expressive aphasia  on exam. The following imaging was completed:  CT. Patient is not a candidate for tPA due to being too mild to treat. Disorientation is baseline and word finding is new per daughter. Care/Plan: q30 VS/mNIHSS until 1530. Bedside handoff with ED RN April.    Kathrin Greathouse  Stroke Response RN

## 2020-08-14 ENCOUNTER — Observation Stay (HOSPITAL_COMMUNITY): Payer: Medicare HMO

## 2020-08-14 ENCOUNTER — Observation Stay (HOSPITAL_BASED_OUTPATIENT_CLINIC_OR_DEPARTMENT_OTHER): Payer: Medicare HMO

## 2020-08-14 ENCOUNTER — Telehealth: Payer: Self-pay | Admitting: Family Medicine

## 2020-08-14 DIAGNOSIS — I340 Nonrheumatic mitral (valve) insufficiency: Secondary | ICD-10-CM

## 2020-08-14 DIAGNOSIS — F0150 Vascular dementia without behavioral disturbance: Secondary | ICD-10-CM | POA: Diagnosis not present

## 2020-08-14 DIAGNOSIS — E1165 Type 2 diabetes mellitus with hyperglycemia: Secondary | ICD-10-CM

## 2020-08-14 DIAGNOSIS — G319 Degenerative disease of nervous system, unspecified: Secondary | ICD-10-CM | POA: Diagnosis not present

## 2020-08-14 DIAGNOSIS — I351 Nonrheumatic aortic (valve) insufficiency: Secondary | ICD-10-CM | POA: Diagnosis not present

## 2020-08-14 DIAGNOSIS — I10 Essential (primary) hypertension: Secondary | ICD-10-CM

## 2020-08-14 DIAGNOSIS — I739 Peripheral vascular disease, unspecified: Secondary | ICD-10-CM | POA: Diagnosis not present

## 2020-08-14 DIAGNOSIS — I672 Cerebral atherosclerosis: Secondary | ICD-10-CM | POA: Diagnosis not present

## 2020-08-14 DIAGNOSIS — H748X2 Other specified disorders of left middle ear and mastoid: Secondary | ICD-10-CM | POA: Diagnosis not present

## 2020-08-14 DIAGNOSIS — I350 Nonrheumatic aortic (valve) stenosis: Secondary | ICD-10-CM | POA: Diagnosis not present

## 2020-08-14 DIAGNOSIS — I6523 Occlusion and stenosis of bilateral carotid arteries: Secondary | ICD-10-CM | POA: Diagnosis not present

## 2020-08-14 DIAGNOSIS — G459 Transient cerebral ischemic attack, unspecified: Secondary | ICD-10-CM

## 2020-08-14 DIAGNOSIS — H748X3 Other specified disorders of middle ear and mastoid, bilateral: Secondary | ICD-10-CM | POA: Diagnosis not present

## 2020-08-14 DIAGNOSIS — R569 Unspecified convulsions: Secondary | ICD-10-CM | POA: Diagnosis not present

## 2020-08-14 DIAGNOSIS — Z8673 Personal history of transient ischemic attack (TIA), and cerebral infarction without residual deficits: Secondary | ICD-10-CM | POA: Diagnosis not present

## 2020-08-14 LAB — BASIC METABOLIC PANEL WITH GFR
Anion gap: 9 (ref 5–15)
BUN: 11 mg/dL (ref 8–23)
CO2: 26 mmol/L (ref 22–32)
Calcium: 9.4 mg/dL (ref 8.9–10.3)
Chloride: 102 mmol/L (ref 98–111)
Creatinine, Ser: 1.13 mg/dL — ABNORMAL HIGH (ref 0.44–1.00)
GFR, Estimated: 48 mL/min — ABNORMAL LOW
Glucose, Bld: 196 mg/dL — ABNORMAL HIGH (ref 70–99)
Potassium: 3.8 mmol/L (ref 3.5–5.1)
Sodium: 137 mmol/L (ref 135–145)

## 2020-08-14 LAB — CBC
HCT: 35.5 % — ABNORMAL LOW (ref 36.0–46.0)
Hemoglobin: 11.8 g/dL — ABNORMAL LOW (ref 12.0–15.0)
MCH: 29.9 pg (ref 26.0–34.0)
MCHC: 33.2 g/dL (ref 30.0–36.0)
MCV: 89.9 fL (ref 80.0–100.0)
Platelets: 202 10*3/uL (ref 150–400)
RBC: 3.95 MIL/uL (ref 3.87–5.11)
RDW: 12 % (ref 11.5–15.5)
WBC: 6.8 10*3/uL (ref 4.0–10.5)
nRBC: 0 % (ref 0.0–0.2)

## 2020-08-14 LAB — ECHOCARDIOGRAM COMPLETE
P 1/2 time: 413 ms
S' Lateral: 2.74 cm
Weight: 2250.46 [oz_av]

## 2020-08-14 LAB — GLUCOSE, CAPILLARY
Glucose-Capillary: 183 mg/dL — ABNORMAL HIGH (ref 70–99)
Glucose-Capillary: 219 mg/dL — ABNORMAL HIGH (ref 70–99)
Glucose-Capillary: 128 mg/dL — ABNORMAL HIGH (ref 70–99)

## 2020-08-14 MED ORDER — IOHEXOL 350 MG/ML SOLN
64.0000 mL | Freq: Once | INTRAVENOUS | Status: AC | PRN
  Administered 2020-08-14: 64 mL via INTRAVENOUS

## 2020-08-14 MED ORDER — TICAGRELOR 90 MG PO TABS: 90.0000 mg | ORAL_TABLET | Freq: Two times a day (BID) | ORAL | 0 refills | Status: DC

## 2020-08-14 NOTE — Evaluation (Addendum)
Occupational Therapy Evaluation Patient Details Name: Shawna Lynch MRN: [id number] DOB: [birth year] Today's Date: 08/14/2020    History of Present Illness Pt is an 85 y/o female admitted secondary to R arm weakness and numbness, word finding difficulty, and R sided face numbness. CT negative. Thought to be from TIA. PMH includes HTN, DM, dementia, and CAD.    Clinical Impression   Patient evaluated by Occupational Therapy with no further acute OT needs identified. All education has been completed and the patient has no further questions. See below for any follow-up Occupational Therapy or equipment needs. OT to sign off. Thank you for referral.   Daughter reports that doing task in this session was near baseline for patient. Daughter plans to stay with patient to decr fall risk. Educated on fall risk safety times such as night lights and removal of throw rugs.      Follow Up Recommendations  No OT follow up    Equipment Recommendations  None recommended by OT    Recommendations for Other Services       Precautions / Restrictions Precautions Precautions: Fall      Mobility Bed Mobility Overal bed mobility: Modified Independent                  Transfers Overall transfer level: Needs assistance   Transfers: Sit to/from Stand Sit to Stand: Supervision              Balance                                           ADL either performed or assessed with clinical judgement   ADL Overall ADL's : Needs assistance/impaired Eating/Feeding: Modified independent   Grooming: Wash/dry hands;Supervision/safety;Standing   Upper Body Bathing: Supervision/ safety   Lower Body Bathing: Supervison/ safety   Upper Body Dressing : Supervision/safety   Lower Body Dressing: Supervision/safety   Toilet Transfer: Supervision/safety           Functional mobility during ADLs: Supervision/safety General ADL Comments: recommendation for (A) initially  with return to home due to fall risk     Vision Baseline Vision/History: No visual deficits       Perception     Praxis      Pertinent Vitals/Pain Pain Assessment: No/denies pain     Hand Dominance Right   Extremity/Trunk Assessment Upper Extremity Assessment Upper Extremity Assessment: Overall WFL for tasks assessed   Lower Extremity Assessment Lower Extremity Assessment: Overall WFL for tasks assessed   Cervical / Trunk Assessment Cervical / Trunk Assessment: Kyphotic   Communication Communication Communication: Expressive difficulties (word finding deficits )   Cognition Arousal/Alertness: Awake/alert Behavior During Therapy: WFL for tasks assessed/performed Overall Cognitive Status: History of cognitive impairments - at baseline                                 General Comments: Dementia at baseline    General Comments       Exercises     Shoulder Instructions      Home Living Family/patient expects to be discharged to:: Private residence Living Arrangements: Children;Other (Comment) Available Help at Discharge: Family;Personal care attendant Type of Home: House Home Access: Level entry     Home Layout: One level     Bathroom Shower/Tub: Occupational psychologist:  Standard     Home Equipment: Walker - 2 wheels;Cane - single point;Shower seat;Grab bars - tub/shower   Additional Comments: daughter can give 24/7 (A) as needed and has an aide now      Prior Functioning/Environment Level of Independence: Needs assistance  Gait / Transfers Assistance Needed: Independent with ambulation ADL's / Homemaking Assistance Needed: Aide assists with bathing and IADLs.             OT Problem List:        OT Treatment/Interventions:      OT Goals(Current goals can be found in the care plan section) Acute Rehab OT Goals Patient Stated Goal: to go home OT Goal Formulation: With patient/family  OT Frequency:     Barriers to D/C:             Co-evaluation              AM-PAC OT "6 Clicks" Daily Activity     Outcome Measure Help from another person eating meals?: A Little Help from another person taking care of personal grooming?: A Little Help from another person toileting, which includes using toliet, bedpan, or urinal?: A Little Help from another person bathing (including washing, rinsing, drying)?: A Little Help from another person to put on and taking off regular upper body clothing?: A Little Help from another person to put on and taking off regular lower body clothing?: A Little 6 Click Score: 18   End of Session Nurse Communication: Mobility status;Precautions  Activity Tolerance: Patient tolerated treatment well Patient left: in bed;with call bell/phone within reach;with bed alarm set;with family/visitor present  OT Visit Diagnosis: Unsteadiness on feet (R26.81)                Time: 1443-1500 OT Time Calculation (min): 17 min Charges:  OT General Charges $OT Visit: 1 Visit OT Evaluation $OT Eval Moderate Complexity: 1 Mod   Shawna Lynch, OTR/L  Acute Rehabilitation Services Pager: [phone] Office: [phone] .   Shawna Lynch 08/14/2020, 3:43 PM

## 2020-08-14 NOTE — Care Management Obs Status (Signed)
MEDICARE OBSERVATION STATUS NOTIFICATION   Patient Details  Name: Shawna Lynch MRN: [id number] Date of Birth: Mar 16, 1935   Medicare Observation Status Notification Given:  Yes    Pollie Friar, RN 08/14/2020, 4:10 PM

## 2020-08-14 NOTE — Progress Notes (Signed)
  Echocardiogram 2D Echocardiogram has been performed.  Fidel Levy 08/14/2020, 11:12 AM

## 2020-08-14 NOTE — Progress Notes (Signed)
EEG complete - results pending 

## 2020-08-14 NOTE — Hospital Course (Addendum)
Shawna Lynch is an 85 yo female with pmh significant for TIA, HTN, HLD, Diabetes, Dementia, CAD, NSTEMI.   TIA Patient presented with numbness and weakness in right arm and face, and aphasia for 1 day.  Symptoms resolved after arriving to the hospital.  Neurology was consulted and Head CT was ordered, and showed no acute findings.  Patient continued to receive dialy Aspirin and Plavix due to having drug eluting stent in Mid-circumflex artery.  Patient also received CTA to intracranial arterial occlusion or high-grade stenosis.  Bilateral proximal internal carotid artery stenosis measuring 75% on the right and 50% on the left.  Patient also had UDS which was normal and coagulation panel which was normal.  Had Echocardiogram with EF 60-65% and no other abnormalities.  Patient's anti-hypertensive medications were hel and she remained with stable BP.  Also had EEG which showed nonspecific cortical dysfunction in left temporal region as well as mild diffuse encephalopathy, nonspecific etiology.  No seizures or epileptiform discharges were seen throughout the recording.  Was cleared for D/C byt PT/OT with close supervision by daughter at home and follow-up with neurology outpatient.  Likely Acute on Chronic Kidney Failure, Unsure of patient's baseline.  On admission had Creatinine of 1.24 with GFR of 43.  Improved to 1.13 and 48 following day.  Patient continued to tolerate PO well and did not require IV fluids.

## 2020-08-14 NOTE — Discharge Summary (Addendum)
[facility] Discharge Summary  Patient name: Shawna Lynch Medical record number: [id number] Date of birth: [birth year] Age: 85 y.o. Gender: female Date of Admission: 08/13/2020  Date of Discharge: 08/14/20 Admitting Physician: Delora Fuel, MD  Primary Care Provider: Isaias Sakai, DO Consultants: Neurology  Indication for Hospitalization: Altered Mental status  Discharge Diagnoses/Problem List:  TIA, HTN, HLD, Diabetes, Dementia, CAD w/ drug eluting stent of Circumflex artery, NSTEMI.   Disposition: Able to be discharged home safely  Discharge Condition: Stable  Discharge Exam:   Physical Exam Constitutional:      General: She is not in acute distress.    Appearance: Normal appearance. She is not ill-appearing.  HENT:     Head: Normocephalic and atraumatic.     Mouth/Throat:     Mouth: Mucous membranes are moist.  Cardiovascular:     Rate and Rhythm: Normal rate and regular rhythm.  Pulmonary:     Effort: Pulmonary effort is normal.     Breath sounds: Normal breath sounds.  Neurological:     General: No focal deficit present.     Mental Status: She is alert.     Cranial Nerves: No cranial nerve deficit.     Motor: No weakness.     Comments: Alert and Oriented to person and place, not time  Psychiatric:        Mood and Affect: Mood normal.        Behavior: Behavior normal.     Brief Hospital Course:  Shawna Lynch is an 85 yo female with pmh significant for TIA, HTN, HLD, Diabetes, Dementia, CAD, NSTEMI.   TIA Patient presented with numbness and weakness in right arm and face, and aphasia for 1 day.  Symptoms resolved after arriving to the hospital. Patient's anti-hypertensive medications were held and she remained with stable BP. Neurology was consulted.  Work up as follows:   Head CT was ordered, and showed no acute findings.   MRI brain w/o No acute findings.   CTA with intracranial arterial occlusion or high-grade  stenosis.  Bilateral proximal internal carotid artery stenosis measuring 75% on the right and 50% on the left. Echocardiogram with EF 60-65% and no other abnormalities.    Also had EEG which showed nonspecific cortical dysfunction in left temporal region as well as mild diffuse encephalopathy, nonspecific etiology.  No seizures or epileptiform discharges were seen throughout the recording.   Was cleared for discharge by PT/OT with close supervision by daughter at home and follow-up with neurology outpatient.  Prior to admission, patient on daily aspirin and Plavix due to DES in mid circumflex artery.  Per neurology recommendations, Plavix was replaced with Brilinta.  She is to continue aspirin and Brilinta x1 month and restart Plavix after 1 month.  Likely Acute on Chronic Kidney Failure, Unsure of patient's baseline.  On admission had Creatinine of 1.24 with GFR of 43. Patient continued to tolerate PO well and did not require IV fluids.  Trended to 1.13 and 48 following day.   Issues for Follow Up:  1. TIA- Ensure no repeat symtoms have occurred, has follow-up scheduled with Neurology.  Will start Brilinta for 1 month, then stop and restart Plavix after 1 month.  Also consider starting ACE/ARB. 2. Dementia- Discuss Namzaric (refill for 1 year sent by her o/p neuro provider in July 2021). Patient and duaghter report that she is not currently taking.  3. Type 2 DM- Patient had A1C-8.9.  Consider increasing Lantus dose or other  medicines given increased A1C.   4. Acute on Chronic Kidney Disease- Re-check BMP to further determine baseline GFR.  Significant Procedures: Echo, EEG  Significant Labs and Imaging:  Recent Labs  Lab 08/13/20 1233 08/13/20 1238 08/14/20 0454  WBC 8.4  --  6.8  HGB 12.9 12.6 11.8*  HCT 38.9 37.0 35.5*  PLT 205  --  202   Recent Labs  Lab 08/13/20 1233 08/13/20 1233 08/13/20 1238 08/14/20 0454  NA 137  --  137 137  K 3.9   < > 3.9 3.8  CL 99  --  98 102  CO2  26  --   --  26  GLUCOSE 296*  --  300* 196*  BUN 12  --  14 11  CREATININE 1.24*  --  1.20* 1.13*  CALCIUM 9.3  --   --  9.4  ALKPHOS 49  --   --   --   AST 30  --   --   --   ALT 29  --   --   --   ALBUMIN 3.5  --   --   --    < > = values in this interval not displayed.     Results/Tests Pending at Time of Discharge: None  Discharge Medications:  Allergies as of 08/14/2020      Reactions   Penicillins Hives   Sulfa Antibiotics Rash   Vicodin [hydrocodone-acetaminophen] Nausea And Vomiting   Codeine Rash      Medication List    STOP taking these medications   clopidogrel 75 MG tablet Commonly known as: PLAVIX     TAKE these medications   aspirin 81 MG EC tablet Take 1 tablet (81 mg total) by mouth daily.   atorvastatin 80 MG tablet Commonly known as: LIPITOR Take 1 tablet (80 mg total) by mouth daily at 6 PM.   carvedilol 25 MG tablet Commonly known as: COREG Take 12.5 mg by mouth 2 (two) times daily with a meal.   cholecalciferol 1000 units tablet Commonly known as: VITAMIN D Take 1,000 Units by mouth daily.   escitalopram 10 MG tablet Commonly known as: LEXAPRO Take 10 mg by mouth daily.   isosorbide mononitrate 60 MG 24 hr tablet Commonly known as: IMDUR TAKE 1 TABLET EVERY DAY   Lantus SoloStar 100 UNIT/ML Solostar Pen Generic drug: insulin glargine Inject 10 Units into the skin at bedtime.   metFORMIN 500 MG tablet Commonly known as: GLUCOPHAGE Take 500 mg by mouth daily.   nitroGLYCERIN 0.4 MG SL tablet Commonly known as: NITROSTAT Place 1 tablet (0.4 mg total) under the tongue every 5 (five) minutes x 3 doses as needed for chest pain. What changed:   when to take this  reasons to take this   omega-3 acid ethyl esters 1 g capsule Commonly known as: LOVAZA Take 1 capsule (1 g total) by mouth 2 (two) times daily.   ticagrelor 90 MG Tabs tablet Commonly known as: Brilinta Take 1 tablet (90 mg total) by mouth 2 (two) times daily.    TRULICITY [place] Inject 3 mg into the skin every Monday.   vitamin B-12 1000 MCG tablet Commonly known as: CYANOCOBALAMIN Take 1,000 mcg by mouth daily.       Discharge Instructions: Please refer to Patient Instructions section of EMR for full details.  Patient was counseled important signs and symptoms that should prompt return to medical care, changes in medications, dietary instructions, activity restrictions, and follow up appointments.  Follow-Up Appointments:   PCP on 11/17  Delora Fuel, MD 08/14/2020, 4:25 PM PGY-1, [place] Upper-Level Resident Addendum I have discussed the above with the original author and agree with their documentation. My edits for correction/addition/clarification are included. Please see also any attending notes.   Wilber Oliphant, M.D.  PGY-3 08/14/2020 4:49 PM

## 2020-08-14 NOTE — Telephone Encounter (Signed)
Message to PCP regarding request for neuro referral order

## 2020-08-14 NOTE — Discharge Instructions (Signed)
Dear Shawna Lynch,   Thank you for letting us participate in your care! In this section, you will find a brief hospital admission summary of why you were admitted to the hospital, what happened during your admission, your diagnosis/diagnoses, and recommended follow up.   You were admitted because you were experiencing stroke-like symptoms.   You were diagnosed with a Transeint Ischemic Attack (TIA).  You were also seen by Neurology, who hot imaging including a CT and MRI of your head which showed no signs of acute stroke. They recommended starting Brilinta for 1 month.  Your symptoms improved and you were discharged from the hospital for meeting this goal.    POST-HOSPITAL & CARE INSTRUCTIONS 1. F/u with PCP tomorrow. 2. Neurology will call you to schedule a follow-up appointment. 3. Please let PCP/Specialists know of any changes that were made.  4. Please see medications section of this packet for any medication changes.    Thank you for choosing [facility]! Take care and be well!     Transient Ischemic Attack A transient ischemic attack (TIA) is a "warning stroke" that causes stroke-like symptoms that then go away quickly. The symptoms of a TIA come on suddenly, and they last less than 24 hours. Unlike a stroke, a TIA does not cause permanent damage to the brain. It is important to know the symptoms of a TIA and what to do. Seek medical care right away, even if your symptoms go away. Having a TIA is a sign that you are at higher risk for a permanent stroke. Lifestyle changes and medical treatments can help prevent a stroke. What are the causes? This condition is caused by a temporary blockage in an artery in the head or neck. The blockage does not allow the brain to get the blood supply it needs and can cause various symptoms. The blockage can be caused by:  Fatty buildup in an artery in the head or neck (atherosclerosis).  A blood clot.  Tearing of an artery  (dissection).  Inflammation of an artery (vasculitis). Sometimes the cause is not known. What increases the risk? Certain factors may make you more likely to develop this condition. Some of these factors are things that you can change, such as:  Obesity.  Using products that contain nicotine or tobacco, such as cigarettes and e-cigarettes.  Taking oral birth control, especially if you also use tobacco.  Lack of physical activity.  Excessive use of alcohol.  Use of drugs, especially cocaine and methamphetamine. Other risk factors include:  High blood pressure (hypertension).  High cholesterol.  Diabetes mellitus.  Heart disease (coronary artery disease).  Atrial fibrillation.  Being over the age of 50.  Being female.  Family history of stroke.  Previous history of blood clots, stroke, TIA, or heart attack.  Sickle cell disease.  Being a woman with a history of preeclampsia.  Migraine headache.  Sleep apnea.  Chronic inflammatory diseases, such as rheumatoid arthritis or lupus.  Blood clotting disorders (hypercoagulable state). What are the signs or symptoms? Symptoms of this condition are the same as those of a stroke, but they are temporary. The symptoms develop suddenly, and they go away quickly, usually within minutes to hours. Symptoms may include sudden:  Weakness or numbness in your face, arm, or leg, especially on one side of your body.  Trouble walking or difficulty moving your arms or legs.  Trouble speaking, understanding speech, or both (aphasia).  Vision changes in one or both eyes. These include  double vision, blurred vision, or loss of vision.  Dizziness.  Confusion.  Loss of balance or coordination.  Nausea and vomiting.  Severe headache with no known cause. If possible, make note of the exact time that you last felt like your normal self and what time your symptoms started. Tell your health care provider. How is this diagnosed? This  condition may be diagnosed based on:  Your symptoms and medical history.  A physical exam.  Imaging tests, usually a CT or MRI scan of the brain.  Blood tests. You may also have other tests, including:  Electrocardiogram (ECG).  Echocardiogram.  Carotid ultrasound.  A scan of the brain circulation (CT angiogram or MRI angiogram).  Continuous heart monitoring. How is this treated? The goal of treatment is to reduce the risk for a subsequent stroke. Treatment may include stroke prevention therapies such as:  Changes to diet or lifestyle to decrease your risk. Lifestyle changes may include exercising and stopping smoking.  Medicines to thin the blood (antiplatelets or anticoagulants).  Blood pressure medicines.  Medicines to reduce cholesterol.  Treating other health conditions, such as diabetes or atrial fibrillation. If testing shows that you have narrowing in the arteries to your brain, your health care provider may recommend a procedure, such as:  Carotid endarterectomy. This is a surgery to remove the blockage from your artery.  Carotid angioplasty and stenting. This is a procedure to open or widen an artery in the neck using a metal mesh tube (stent). The stent helps keep the artery open by supporting the artery walls. Follow these instructions at home: Medicines  Take over-the-counter and prescription medicines only as told by your health care provider.  If you were told to take a medicine to thin your blood, such as aspirin or an anticoagulant, take it exactly as told by your health care provider. ? Taking too much blood-thinning medicine can cause bleeding. ? If you do not take enough blood-thinning medicine, you will not have the protection that you need against a stroke and other problems. Eating and drinking   Eat 5 or more servings of fruits and vegetables each day.  Follow instructions from your health care provider about diet. You may need to follow a  certain nutrition plan to help manage risk factors for stroke, such as high blood pressure, high cholesterol, diabetes, or obesity. This may include: ? Eating a low-fat, low-salt diet. ? Including a lot of fiber in your diet. ? Limiting the amount of carbohydrates and sugar in your diet.  Limit alcohol intake to no more than 1 drink a day for nonpregnant women and 2 drinks a day for men. One drink equals 12 oz of beer, 5 oz of wine, or 1 oz of hard liquor. General instructions  Maintain a healthy weight.  Stay physically active. Try to get at least 30 minutes of exercise on most or all days.  Find out if you have sleep apnea, and seek treatment if needed.  Do not use any products that contain nicotine or tobacco, such as cigarettes and e-cigarettes. If you need help quitting, ask your health care provider.  Do not abuse drugs.  Keep all follow-up visits as told by your health care provider. This is important. Where to find more information  American Stroke Association: www.strokeassociation.org  National Stroke Association: www.stroke.org Get help right away if:  You have chest pain or an irregular heartbeat.  You have any symptoms of stroke. The acronym BEFAST is an easy way to  remember the main warning signs of stroke. ? B - Balance problems. Signs include dizziness, sudden trouble walking, or loss of balance. ? E - Eye problems. This includes trouble seeing or a sudden change in vision. ? F - Face changes. This includes sudden weakness or numbness of the face, or the face or eyelid drooping to one side. ? A - Arm weakness or numbness. This happens suddenly and usually on one side of the body. ? S - Speech problems. This includes trouble speaking or trouble understanding speech. ? T - Time. Time to call 911 or seek emergency care. Do not wait to see if symptoms will go away. Make note of the time your symptoms started.  Other signs of stroke may include: ? A sudden, severe  headache with no known cause. ? Nausea or vomiting. ? Seizure. These symptoms may represent a serious problem that is an emergency. Do not wait to see if the symptoms will go away. Get medical help right away. Call your local emergency services (911 in the [place] Do not drive yourself to the hospital. Summary  A TIA happens when an artery in the head or neck is blocked, leading to stroke-like symptoms that then go away quickly. The blockage clears before there is any permanent brain damage. A TIA is a medical emergency and requires immediate medical attention.  Symptoms of this condition are the same as those of a stroke, but they are temporary. The symptoms usually develop suddenly, and they go away quickly, usually within minutes to hours.  Having a TIA means that you are at high risk of a stroke in the near future.  Treatment may include medicines to thin the blood as well as medicines, diet changes, and lifestyle changes to manage conditions that increase the risk of another TIA or a stroke. This information is not intended to replace advice given to you by your health care provider. Make sure you discuss any questions you have with your health care provider. Document Revised: 03/25/2019 Document Reviewed: 03/25/2019 Elsevier Patient Education  2020 [facility]  [address] [place], [place] [address] [phone]

## 2020-08-14 NOTE — TOC Initial Note (Addendum)
Transition of Care [facility]) - Initial/Assessment Note    Patient Details  Name: Shawna Lynch MRN: [id number] Date of Birth: [birth year]  Transition of [facility]) CM/SW Contact:    Pollie Friar, RN Phone Number: 08/14/2020, 4:14 PM  Clinical Narrative:                 CM met with the patient and her daughter at the bedside. Pt lives home alone but has caregivers from 9-5 daily and daughter is going to increase the amount of services. No f/u per PT/OT and no DME needs.  Daughter denies issues with home medications or transportation. Pt provided 30 day free card for Brilinta. TOC following.  Expected Discharge Plan: Home/Self Care Barriers to Discharge: Continued Medical Work up   Patient Goals and CMS Choice        Expected Discharge Plan and Services Expected Discharge Plan: Home/Self Care   Discharge Planning Services: CM Consult   Living arrangements for the past 2 months: Single Family Home                                      Prior Living Arrangements/Services Living arrangements for the past 2 months: Single Family Home Lives with:: Self Patient language and need for interpreter reviewed:: Yes Do you feel safe going back to the place where you live?: Yes      Need for Family Participation in Patient Care: Yes (Comment) Care giver support system in place?: Yes (comment)   Criminal Activity/Legal Involvement Pertinent to Current Situation/Hospitalization: No - Comment as needed  Activities of Daily Living Home Assistive Devices/Equipment: None ADL Screening (condition at time of admission) Patient's cognitive ability adequate to safely complete daily activities?: No Is the patient deaf or have difficulty hearing?: No Does the patient have difficulty seeing, even when wearing glasses/contacts?: No Does the patient have difficulty concentrating, remembering, or making decisions?: Yes Patient able to express need for assistance with ADLs?: No Does the  patient have difficulty dressing or bathing?: No Independently performs ADLs?: No Communication: Appropriate for developmental age Dressing (OT): Appropriate for developmental age Grooming: Appropriate for developmental age Feeding: Independent Bathing: Needs assistance Is this a change from baseline?: Pre-admission baseline Toileting: Needs assistance Is this a change from baseline?: Pre-admission baseline In/Out Bed: Independent Walks in Home: Independent Does the patient have difficulty walking or climbing stairs?: No Weakness of Legs: Right Weakness of Arms/Hands: None  Permission Sought/Granted                  Emotional Assessment Appearance:: Appears stated age Attitude/Demeanor/Rapport: Engaged Affect (typically observed): Accepting Orientation: : Oriented to Self   Psych Involvement: No (comment)  Admission diagnosis:  TIA (transient ischemic attack) [G45.9] Patient Active Problem List   Diagnosis Date Noted  . TIA (transient ischemic attack) 08/13/2020  . Mild cognitive impairment 03/09/2018  . Pruritus 03/09/2018  . CAD S/P percutaneous coronary angioplasty 04/27/2017  . Dementia ([place]) 04/27/2017  . Dyslipidemia 04/07/2017  . Non-insulin treated type 2 diabetes mellitus ([place]) 04/06/2017  . Essential hypertension 04/06/2017  . History of non-ST elevation myocardial infarction (NSTEMI) 04/03/2017   PCP:  Isaias Sakai, DO Pharmacy:   [id number] [id number]: [phone]: [phone] [place] [address] Phone: [phone] Fax: [phone]     Social Determinants of Health (SDOH) Interventions    Readmission Risk Interventions No flowsheet data found.

## 2020-08-14 NOTE — Progress Notes (Addendum)
STROKE TEAM PROGRESS NOTE    Interval History   No acute events overnight, she is finishing her work up for transient right sided weakness, right facial droop and slurred speech. Echocardiogram done earlier and EEG results are pending.   Pertinent Lab Work and Imaging    CT Head WO IV Contrast 1. No evidence of acute intracranial abnormality.  ASPECTS is 10. 2. Chronic microvascular ischemic disease and generalized atrophy.  CT Angio Head and Neck W WO IV Contrast 1. No intracranial arterial occlusion or high-grade stenosis. 2. Bilateral proximal internal carotid artery stenosis measuring 75 % on the right and 50 % on the left.  MRI Brain WO IV Contrast 1. No acute intracranial abnormality. 2. Diffuse, advanced atrophy and chronic ischemic microangiopathy. 3. Bilateral mastoid effusions.  Echocardiogram Complete  1. Left ventricular ejection fraction, by estimation, is 60 to 65%. The  left ventricle has normal function. The left ventricle has no regional  wall motion abnormalities. There is mild asymmetric left ventricular  hypertrophy of the basal-septal segment.  Indeterminate diastolic filling due to E-A fusion.  2. Right ventricular systolic function is normal. The right ventricular  size is normal. There is normal pulmonary artery systolic pressure.  3. The mitral valve is normal in structure. Mild mitral valve  regurgitation. No evidence of mitral stenosis.  4. The aortic valve is grossly normal. There is moderate calcification of  the aortic valve. Aortic valve regurgitation is mild. Mild aortic valve  sclerosis is present, with no evidence of aortic valve stenosis.  5. The inferior vena cava is normal in size with greater than 50%  respiratory variability, suggesting right atrial pressure of 3 mmHg.   No intracardiac source of embolism detected on this transthoracic study. A transesophageal echocardiogram is  recommended to exclude cardiac source of embolism if  clinically indicated.  Physical Examination   Constitutional: Calm, appropriate for condition  Cardiovascular: Normal RR Respiratory: No increased WOB   Mental status: Oriented to self only, follows commands  Speech: Fluent with repetition and naming intact, no dysarthria noted Cranial nerves: EOMI, VFF, Face symmetric, Tongue midline, Shoulder shrug intact  Motor: Normal bulk and tone. No drift.   Dlt Bic Tri FgS Grp HF  KnF KnE PIF DoF  R 5 5 5 5 5 5 5 5 5 5   L 5 5 5 5 5 5 5 5 5 5   Sensory: Intact to light tough throughout  Coordination: Intact FNF  Reflexes: Not assessed Gait: Deferred  NIHSS: 0   Assessment and Plan   Shawna Lynch is a 85 y.o. female w/pmh of DM, CAD s/p DES, HTN and prior TIA who presents with right sided weakness, right facial droop and slurred speech. Her symptoms resolved in the ED upon arrival to the hospital. She did not qualify for IVTPA given low NIHSS and did not qualify for thrombectomy given no LVO.   #Transient right sided weakness, right facial droop and slurred speech  Patient presented with the symptoms described above. At this time, stroke work up has been completed. Her MRI Brain revealed no acute stroke. Echocardiogram w/no intracardiac source of stroke, EF 60 to 5 % and normal size left atrium. Her CT Angio Head and Neck was notable for bilateral proximal internal carotid artery stenosis measuring 75 % on the right and 50 % on the left. Stroke labs were completed including Lipid panel w/LDL 23 and Hemoglobin A1C 8.9. An EEG was done to evaluate for possible seizure that could  have caused her transient neurological symptoms. More than likely she had a transient ischemic attack given stroke risk factors of HTN, DM and vessel imaging notable for severe stenosis to bilateral carotids. Of note, she takes aspirin and plavix at home for CAD. Given poor baseline status due to dementia, vascular surgery was not consulted for intervention with CEA or  stenting for high level stenosis.  - F/U final EEG report  - Given she had a stroke while taking Aspirin and Plavix will have her do a month of Aspirin + Brilinta then transition back to Aspirin and Plavix- 90 mg Brilinta BID + ASA 81 mg QD for one month  - Continue Atorvastatin 80 mg for secondary stroke prevention  - At discharge please place ambulatory referral to neurology for stroke follow up   #Hypertension #CAD  #History of NSTEMI 2018  She has a history of hypertension and CAD- at home she takes Coreg 25 mg BID + Imdur 60 mg 24 hour tablet. Given she did not have an acute stroke there is no need for permissive HTN and her blood pressure medications can be resumed. Long term blood pressure goal is < 140/90.  #Hyperlipidemia From a stroke prevention stand point, the LDL goal is < 70. Her LDL is 23 and at goal- continue Atorvastatin 80 mg   #Diabetes type II Uncontrolled She has diabetes with Hemoglobin A1C this admission noted to be 8.9. Takes Metformin 500 QD. From a stroke stand point A1C should be < 7. Diabetes should be managed by her PCP on an outpatient basis.  At this time all neurological work up is done aside from EEG(results pending). If the EEG is negative then the stroke team will sign off and no further neurological work up is necessary.   Hospital day # 0  Shawna Hinds, NP  [facility] Nurse Practitioner Patient seen and discussed with attending physician Dr. Erlinda Lynch   ATTENDING NOTE: I reviewed above note and agree with the assessment and plan. Pt was seen and examined.   85 year old female with history of TIA, hypertension, diabetes, dementia, CAD/MI admitted for right facial droop, numbness, right arm weakness, slurred speech and aphasia.  Symptoms much improved in ER, resolved overnight.  CT no acute abnormality.  MRI no acute infarct.  CT head and neck showed right ICA 75% and left ICA 50% stenosis.  EF 60 to 65%.  EEG no seizure.  UDS negative.  A1c 8.9,  LDL 23.  Examination showed awake alert, orientated to self and people, however not orientated to year, age or place.  Otherwise neurologically intact.  Daughter at bedside, confirmed that patient back to her baseline.  Admitted that patient sugar not in good control at home.  Patient presentation most likely TIA in the setting of multiple stroke risk factors.  She is on aspirin Plavix DAPT prior to admission, will recommend aspirin 81 and Brilinta 90 twice daily for 30 days and then continue aspirin and Plavix after.  Continue Lipitor.  Educated on stroke risk factor modification especially diabetes control.  PT OT no recommendation.  Patient can be discharged from neuro standpoint.  Will follow up with our NP Butler Denmark at [facility] in 4 weeks.  Neurology will sign off. Please call with questions. Pt will follow up with Butler Denmark NP at [facility] in about 4 weeks. Thanks for the consult.   Rosalin Hawking, MD PhD Stroke Neurology 08/14/2020 6:10 PM      To contact Stroke Continuity provider, please refer to  [email or web address] After hours, contact General Neurology

## 2020-08-14 NOTE — Progress Notes (Signed)
Pt has been wheeled out via wheelchair. Tele and IV has been disconnected. Pt denies any pain. All belongings has been sent with the patient. AVS documentation has been given and reviewed. Pt sent in good spirits.

## 2020-08-14 NOTE — Procedures (Signed)
Patient Name: CAELEIGH PROHASKA  MRN: [id number]  Epilepsy Attending: Lora Havens  Referring Physician/Provider: Dr. Rosalin Hawking Date: 08/14/2020 Duration: 24.45 minutes  Patient history: 85 year old female who presented with slurred speech and left facial droop.  EEG to evaluate for seizures.  Level of alertness: Awake  AEDs during EEG study: None  Technical aspects: This EEG study was done with scalp electrodes positioned according to the 10-20 International system of electrode placement. Electrical activity was acquired at a sampling rate of 500Hz  and reviewed with a high frequency filter of 70Hz  and a low frequency filter of 1Hz . EEG data were recorded continuously and digitally stored.   Description: The posterior dominant rhythm consists of 8 Hz activity of moderate voltage (25-35 uV) seen predominantly in posterior head regions, symmetric and reactive to eye opening and eye closing. EEG showed intermittent generalized and maximal left temporal 3 to 5 Hz theta-delta slowing.  Hyperventilation and photic stimulation were not performed.     ABNORMALITY -Intermittent slow, generalized and maximal left temporal region  IMPRESSION: This study is suggestive of nonspecific cortical dysfunction in left temporal region as well as mild diffuse encephalopathy, nonspecific etiology.  No seizures or epileptiform discharges were seen throughout the recording.  Jhett Fretwell Barbra Sarks

## 2020-08-15 DIAGNOSIS — Z794 Long term (current) use of insulin: Secondary | ICD-10-CM | POA: Diagnosis not present

## 2020-08-15 DIAGNOSIS — E1165 Type 2 diabetes mellitus with hyperglycemia: Secondary | ICD-10-CM | POA: Diagnosis not present

## 2020-08-15 DIAGNOSIS — F0280 Dementia in other diseases classified elsewhere without behavioral disturbance: Secondary | ICD-10-CM | POA: Diagnosis not present

## 2020-08-15 DIAGNOSIS — Z6823 Body mass index (BMI) 23.0-23.9, adult: Secondary | ICD-10-CM | POA: Diagnosis not present

## 2020-08-15 DIAGNOSIS — I959 Hypotension, unspecified: Secondary | ICD-10-CM | POA: Diagnosis not present

## 2020-08-15 DIAGNOSIS — N1832 Chronic kidney disease, stage 3b: Secondary | ICD-10-CM | POA: Diagnosis not present

## 2020-08-15 DIAGNOSIS — G319 Degenerative disease of nervous system, unspecified: Secondary | ICD-10-CM | POA: Diagnosis not present

## 2020-08-15 DIAGNOSIS — Z8673 Personal history of transient ischemic attack (TIA), and cerebral infarction without residual deficits: Secondary | ICD-10-CM | POA: Diagnosis not present

## 2020-08-16 NOTE — Telephone Encounter (Signed)
Hi thank you for the update, but I am no longer at the practice she was seeing me at for the past two years, therefore I am not the ordering PCP. Please contact [facility] for this.  Thank you.  Dr Vanetta Shawl

## 2020-08-19 ENCOUNTER — Inpatient Hospital Stay (HOSPITAL_COMMUNITY): Payer: Medicare HMO

## 2020-08-19 ENCOUNTER — Emergency Department (HOSPITAL_COMMUNITY): Payer: Medicare HMO

## 2020-08-19 ENCOUNTER — Other Ambulatory Visit: Payer: Self-pay

## 2020-08-19 ENCOUNTER — Inpatient Hospital Stay (HOSPITAL_COMMUNITY)
Admission: EM | Admit: 2020-08-19 | Discharge: 2020-08-24 | DRG: 062 | Disposition: A | Discharge status: Home Health | Payer: Medicare HMO | Attending: Neurology | Admitting: Neurology

## 2020-08-19 DIAGNOSIS — F32A Depression, unspecified: Secondary | ICD-10-CM | POA: Diagnosis present

## 2020-08-19 DIAGNOSIS — Z8249 Family history of ischemic heart disease and other diseases of the circulatory system: Secondary | ICD-10-CM

## 2020-08-19 DIAGNOSIS — I10 Essential (primary) hypertension: Secondary | ICD-10-CM | POA: Diagnosis present

## 2020-08-19 DIAGNOSIS — I639 Cerebral infarction, unspecified: Principal | ICD-10-CM | POA: Diagnosis present

## 2020-08-19 DIAGNOSIS — F0390 Unspecified dementia without behavioral disturbance: Secondary | ICD-10-CM | POA: Diagnosis present

## 2020-08-19 DIAGNOSIS — R918 Other nonspecific abnormal finding of lung field: Secondary | ICD-10-CM | POA: Diagnosis not present

## 2020-08-19 DIAGNOSIS — R29810 Facial weakness: Secondary | ICD-10-CM | POA: Diagnosis not present

## 2020-08-19 DIAGNOSIS — I129 Hypertensive chronic kidney disease with stage 1 through stage 4 chronic kidney disease, or unspecified chronic kidney disease: Secondary | ICD-10-CM | POA: Diagnosis present

## 2020-08-19 DIAGNOSIS — R4781 Slurred speech: Secondary | ICD-10-CM | POA: Diagnosis not present

## 2020-08-19 DIAGNOSIS — E1165 Type 2 diabetes mellitus with hyperglycemia: Secondary | ICD-10-CM | POA: Diagnosis not present

## 2020-08-19 DIAGNOSIS — Z87891 Personal history of nicotine dependence: Secondary | ICD-10-CM

## 2020-08-19 DIAGNOSIS — Z882 Allergy status to sulfonamides status: Secondary | ICD-10-CM

## 2020-08-19 DIAGNOSIS — R29709 NIHSS score 9: Secondary | ICD-10-CM | POA: Diagnosis present

## 2020-08-19 DIAGNOSIS — R4701 Aphasia: Secondary | ICD-10-CM | POA: Diagnosis present

## 2020-08-19 DIAGNOSIS — Z8673 Personal history of transient ischemic attack (TIA), and cerebral infarction without residual deficits: Secondary | ICD-10-CM | POA: Diagnosis not present

## 2020-08-19 DIAGNOSIS — Z7902 Long term (current) use of antithrombotics/antiplatelets: Secondary | ICD-10-CM

## 2020-08-19 DIAGNOSIS — E441 Mild protein-calorie malnutrition: Secondary | ICD-10-CM | POA: Diagnosis present

## 2020-08-19 DIAGNOSIS — R059 Cough, unspecified: Secondary | ICD-10-CM

## 2020-08-19 DIAGNOSIS — I2510 Atherosclerotic heart disease of native coronary artery without angina pectoris: Secondary | ICD-10-CM | POA: Diagnosis present

## 2020-08-19 DIAGNOSIS — Z20822 Contact with and (suspected) exposure to covid-19: Secondary | ICD-10-CM | POA: Diagnosis not present

## 2020-08-19 DIAGNOSIS — F419 Anxiety disorder, unspecified: Secondary | ICD-10-CM | POA: Diagnosis present

## 2020-08-19 DIAGNOSIS — R29818 Other symptoms and signs involving the nervous system: Secondary | ICD-10-CM | POA: Diagnosis not present

## 2020-08-19 DIAGNOSIS — N1832 Chronic kidney disease, stage 3b: Secondary | ICD-10-CM | POA: Diagnosis present

## 2020-08-19 DIAGNOSIS — I672 Cerebral atherosclerosis: Secondary | ICD-10-CM | POA: Diagnosis not present

## 2020-08-19 DIAGNOSIS — Z880 Allergy status to penicillin: Secondary | ICD-10-CM

## 2020-08-19 DIAGNOSIS — E785 Hyperlipidemia, unspecified: Secondary | ICD-10-CM | POA: Diagnosis present

## 2020-08-19 DIAGNOSIS — Z6822 Body mass index (BMI) 22.0-22.9, adult: Secondary | ICD-10-CM

## 2020-08-19 DIAGNOSIS — I252 Old myocardial infarction: Secondary | ICD-10-CM

## 2020-08-19 DIAGNOSIS — R414 Neurologic neglect syndrome: Secondary | ICD-10-CM | POA: Diagnosis present

## 2020-08-19 DIAGNOSIS — G8194 Hemiplegia, unspecified affecting left nondominant side: Secondary | ICD-10-CM | POA: Diagnosis not present

## 2020-08-19 DIAGNOSIS — Z90710 Acquired absence of both cervix and uterus: Secondary | ICD-10-CM | POA: Diagnosis not present

## 2020-08-19 DIAGNOSIS — R471 Dysarthria and anarthria: Secondary | ICD-10-CM | POA: Diagnosis present

## 2020-08-19 DIAGNOSIS — I6523 Occlusion and stenosis of bilateral carotid arteries: Secondary | ICD-10-CM | POA: Diagnosis not present

## 2020-08-19 DIAGNOSIS — Z955 Presence of coronary angioplasty implant and graft: Secondary | ICD-10-CM

## 2020-08-19 DIAGNOSIS — E1122 Type 2 diabetes mellitus with diabetic chronic kidney disease: Secondary | ICD-10-CM | POA: Diagnosis present

## 2020-08-19 DIAGNOSIS — Z7982 Long term (current) use of aspirin: Secondary | ICD-10-CM

## 2020-08-19 DIAGNOSIS — R404 Transient alteration of awareness: Secondary | ICD-10-CM | POA: Diagnosis not present

## 2020-08-19 DIAGNOSIS — G459 Transient cerebral ischemic attack, unspecified: Secondary | ICD-10-CM | POA: Diagnosis not present

## 2020-08-19 DIAGNOSIS — G319 Degenerative disease of nervous system, unspecified: Secondary | ICD-10-CM | POA: Diagnosis not present

## 2020-08-19 DIAGNOSIS — D509 Iron deficiency anemia, unspecified: Secondary | ICD-10-CM | POA: Diagnosis present

## 2020-08-19 DIAGNOSIS — F05 Delirium due to known physiological condition: Secondary | ICD-10-CM | POA: Diagnosis not present

## 2020-08-19 DIAGNOSIS — R1310 Dysphagia, unspecified: Secondary | ICD-10-CM | POA: Diagnosis present

## 2020-08-19 DIAGNOSIS — Z885 Allergy status to narcotic agent status: Secondary | ICD-10-CM

## 2020-08-19 DIAGNOSIS — Z79899 Other long term (current) drug therapy: Secondary | ICD-10-CM

## 2020-08-19 DIAGNOSIS — E119 Type 2 diabetes mellitus without complications: Secondary | ICD-10-CM

## 2020-08-19 LAB — COMPREHENSIVE METABOLIC PANEL
ALT: 24 U/L (ref 0–44)
AST: 21 U/L (ref 15–41)
Albumin: 3.1 g/dL — ABNORMAL LOW (ref 3.5–5.0)
Alkaline Phosphatase: 42 U/L (ref 38–126)
Anion gap: 13 (ref 5–15)
BUN: 21 mg/dL (ref 8–23)
CO2: 24 mmol/L (ref 22–32)
Calcium: 9.7 mg/dL (ref 8.9–10.3)
Chloride: 96 mmol/L — ABNORMAL LOW (ref 98–111)
Creatinine, Ser: 1.37 mg/dL — ABNORMAL HIGH (ref 0.44–1.00)
GFR, Estimated: 38 mL/min — ABNORMAL LOW (ref >60)
Glucose, Bld: 244 mg/dL — ABNORMAL HIGH (ref 70–99)
Potassium: 3.7 mmol/L (ref 3.5–5.1)
Sodium: 133 mmol/L — ABNORMAL LOW (ref 135–145)
Total Bilirubin: 1 mg/dL (ref 0.3–1.2)
Total Protein: 6.1 g/dL — ABNORMAL LOW (ref 6.5–8.1)

## 2020-08-19 LAB — CBC
HCT: 36.7 % (ref 36.0–46.0)
Hemoglobin: 11.8 g/dL — ABNORMAL LOW (ref 12.0–15.0)
MCH: 29.6 pg (ref 26.0–34.0)
MCHC: 32.2 g/dL (ref 30.0–36.0)
MCV: 92 fL (ref 80.0–100.0)
Platelets: 190 10*3/uL (ref 150–400)
RBC: 3.99 MIL/uL (ref 3.87–5.11)
RDW: 12.2 % (ref 11.5–15.5)
WBC: 8.8 10*3/uL (ref 4.0–10.5)
nRBC: 0 % (ref 0.0–0.2)

## 2020-08-19 LAB — I-STAT CHEM 8, ED
BUN: 23 mg/dL (ref 8–23)
Calcium, Ion: 1.14 mmol/L — ABNORMAL LOW (ref 1.15–1.40)
Chloride: 96 mmol/L — ABNORMAL LOW (ref 98–111)
Creatinine, Ser: 1.3 mg/dL — ABNORMAL HIGH (ref 0.44–1.00)
Glucose, Bld: 237 mg/dL — ABNORMAL HIGH (ref 70–99)
HCT: 35 % — ABNORMAL LOW (ref 36.0–46.0)
Hemoglobin: 11.9 g/dL — ABNORMAL LOW (ref 12.0–15.0)
Potassium: 3.6 mmol/L (ref 3.5–5.1)
Sodium: 134 mmol/L — ABNORMAL LOW (ref 135–145)
TCO2: 24 mmol/L (ref 22–32)

## 2020-08-19 LAB — DIFFERENTIAL
Abs Immature Granulocytes: 0.04 10*3/uL (ref 0.00–0.07)
Basophils Absolute: 0 10*3/uL (ref 0.0–0.1)
Basophils Relative: 0 %
Eosinophils Absolute: 0.2 10*3/uL (ref 0.0–0.5)
Eosinophils Relative: 2 %
Immature Granulocytes: 1 %
Lymphocytes Relative: 20 %
Lymphs Abs: 1.7 10*3/uL (ref 0.7–4.0)
Monocytes Absolute: 0.7 10*3/uL (ref 0.1–1.0)
Monocytes Relative: 8 %
Neutro Abs: 6.1 10*3/uL (ref 1.7–7.7)
Neutrophils Relative %: 69 %

## 2020-08-19 LAB — PROTIME-INR
INR: 1.2 (ref 0.8–1.2)
Prothrombin Time: 15 seconds (ref 11.4–15.2)

## 2020-08-19 LAB — RESPIRATORY PANEL BY RT PCR (FLU A&B, COVID)
Influenza A by PCR: NEGATIVE
Influenza B by PCR: NEGATIVE
SARS Coronavirus 2 by RT PCR: NEGATIVE

## 2020-08-19 LAB — APTT: aPTT: 30 seconds (ref 24–36)

## 2020-08-19 LAB — CBG MONITORING, ED: Glucose-Capillary: 238 mg/dL — ABNORMAL HIGH (ref 70–99)

## 2020-08-19 MED ORDER — SENNOSIDES-DOCUSATE SODIUM 8.6-50 MG PO TABS: 1.0000 | ORAL_TABLET | Freq: Every evening | ORAL | Status: DC | PRN

## 2020-08-19 MED ORDER — SODIUM CHLORIDE 0.9% FLUSH
3.0000 mL | Freq: Once | INTRAVENOUS | Status: DC
Start: 2020-08-19 — End: 2020-08-21

## 2020-08-19 MED ORDER — STROKE: EARLY STAGES OF RECOVERY BOOK
Freq: Once | Status: AC
  Filled 2020-08-19: qty 1

## 2020-08-19 MED ORDER — ACETAMINOPHEN 325 MG PO TABS
650.0000 mg | ORAL_TABLET | ORAL | Status: DC | PRN
  Administered 2020-08-21: 650 mg via ORAL
  Filled 2020-08-19: qty 2

## 2020-08-19 MED ORDER — ONDANSETRON HCL 4 MG/2ML IJ SOLN
4.0000 mg | Freq: Once | INTRAMUSCULAR | Status: AC
  Administered 2020-08-19: 4 mg via INTRAVENOUS

## 2020-08-19 MED ORDER — SODIUM CHLORIDE 0.9 % IV SOLN
50.0000 mL | Freq: Once | INTRAVENOUS | Status: AC
  Administered 2020-08-19: 50 mL via INTRAVENOUS

## 2020-08-19 MED ORDER — LABETALOL HCL 5 MG/ML IV SOLN
20.0000 mg | Freq: Once | INTRAVENOUS | Status: AC
  Administered 2020-08-20: 20 mg via INTRAVENOUS
  Filled 2020-08-19: qty 4

## 2020-08-19 MED ORDER — ACETAMINOPHEN 160 MG/5ML PO SOLN: 650.0000 mg | ORAL | Status: DC | PRN

## 2020-08-19 MED ORDER — CLEVIDIPINE BUTYRATE 0.5 MG/ML IV EMUL
0.0000 mg/h | INTRAVENOUS | Status: DC
  Administered 2020-08-20: 8 mg/h via INTRAVENOUS
  Administered 2020-08-20: 21 mg/h via INTRAVENOUS
  Administered 2020-08-20: 2 mg/h via INTRAVENOUS
  Filled 2020-08-19: qty 100
  Filled 2020-08-19: qty 50

## 2020-08-19 MED ORDER — IOHEXOL 350 MG/ML SOLN
75.0000 mL | Freq: Once | INTRAVENOUS | Status: AC | PRN
  Administered 2020-08-19: 75 mL via INTRAVENOUS

## 2020-08-19 MED ORDER — PANTOPRAZOLE SODIUM 40 MG IV SOLR: 40.0000 mg | Freq: Every day | INTRAVENOUS | Status: DC

## 2020-08-19 MED ORDER — ALTEPLASE (STROKE) FULL DOSE INFUSION
0.9000 mg/kg | Freq: Once | INTRAVENOUS | Status: AC
  Administered 2020-08-19: 57.4 mg via INTRAVENOUS
  Filled 2020-08-19: qty 100

## 2020-08-19 MED ORDER — ACETAMINOPHEN 650 MG RE SUPP: 650.0000 mg | RECTAL | Status: DC | PRN

## 2020-08-19 MED ORDER — SODIUM CHLORIDE 0.9 % IV SOLN: INTRAVENOUS | Status: DC

## 2020-08-19 MED ORDER — CHLORHEXIDINE GLUCONATE 0.12 % MT SOLN
15.0000 mL | Freq: Two times a day (BID) | OROMUCOSAL | Status: DC
  Administered 2020-08-20 – 2020-08-24 (×7): 15 mL via OROMUCOSAL
  Filled 2020-08-19 (×6): qty 15

## 2020-08-19 MED ORDER — CHLORHEXIDINE GLUCONATE CLOTH 2 % EX PADS
6.0000 | MEDICATED_PAD | Freq: Every day | CUTANEOUS | Status: DC
  Administered 2020-08-20 – 2020-08-22 (×3): 6 via TOPICAL

## 2020-08-19 NOTE — H&P (Signed)
Neurology-admission H&P Acute ischemic stroke status post IV TPA    CC: Confusion, aphasia, weakness  History is obtained from: EMS, chart review, patient's son and daughter  HPI: Shawna Lynch is a 85 y.o. female past medical history of dementia, coronary artery disease, diabetes, hypertension, presented to the emergency room for evaluation of sudden onset of right-sided weakness which was resolved after a little while and then had sudden onset of left-sided weakness and neglect. The patient was recently seen for a left hemispheric TIA when she had presented on 08/14/2020 with right facial droop and right-sided weakness transient symptoms.  Stroke work-up completed and discharged home on aspirin and Brilinta.  Work-up revealed 75% right carotid stenosis and 50% left carotid stenosis. Today, she was in her usual state of health till about last known well of 6 PM after which she had sudden onset of right facial droop and difficulty talking.  The daughter said that she could not bring her words out.  Also noted to be weak on the right and unable to walk.  This resolved after EMS arrival but they noted that she is weaker on the left side and also is having neglect on the left and double simultaneous stimulation. Code stroke was activated in the field and patient was brought in as an acute code stroke. On my evaluation, the patient's detailed exam is listed below. Initial NIH stroke scale-9 consistent with a right hemispheric stroke. Risks and benefits for IV TPA discussed with the daughter over the phone and IV TPA administered.  Patient is baseline has moderate dementia.  Requires assistance with ADLs 24/7.  In the daytime, has a daytime aide and at night the family checks in at regular intervals.  With EMS, systolics between 1 82-9 40.  Fingerstick glucose in the 300s. Fingerstick glucose in the ED 238.  LKW: 6 PM on 08/19/2020. tpa given?:  Yes Premorbid modified Rankin scale (mRS):  3-4   ROS: Negative except as noted in HPI.  Past Medical History:  Diagnosis Date  . Anxiety   . Arthritis    oa  . Coronary artery disease    04/06/17 PCI with DES to [facility], 90% in diag from LAD, nonobstructive disease in LAD, RCA. Normal EF.   [name] Dementia ([place])   . Depression   . Diabetes mellitus without complication ([place])   . Hypertension   . NSTEMI (non-ST elevated myocardial infarction) ([place])   . Ringing in ears   . TIA (transient ischemic attack)     Family History  Problem Relation Age of Onset  . CAD Father   . Cancer Sister   . Hearing loss Mother   . Hypertension Mother     Social History:   reports that she has quit smoking. She has never used smokeless tobacco. She reports that she does not drink alcohol and does not use drugs.  Medications  Current Facility-Administered Medications:  .  ondansetron (ZOFRAN) injection 4 mg, 4 mg, Intravenous, Once, Amie Portland, MD .  sodium chloride flush (NS) 0.9 % injection 3 mL, 3 mL, Intravenous, Once, Dorie Rank, MD  Current Outpatient Medications:  .  aspirin EC 81 MG EC tablet, Take 1 tablet (81 mg total) by mouth daily., Disp: 30 tablet, Rfl:  .  atorvastatin (LIPITOR) 80 MG tablet, Take 1 tablet (80 mg total) by mouth daily at 6 PM., Disp: 90 tablet, Rfl: 1 .  carvedilol (COREG) 25 MG tablet, Take 12.5 mg by mouth 2 (two) times daily with  a meal., Disp: , Rfl:  .  cholecalciferol (VITAMIN D) 1000 units tablet, Take 1,000 Units by mouth daily., Disp: , Rfl:  .  Dulaglutide (TRULICITY [place]), Inject 3 mg into the skin every Monday. , Disp: , Rfl:  .  escitalopram (LEXAPRO) 10 MG tablet, Take 10 mg by mouth daily., Disp: , Rfl:  .  isosorbide mononitrate (IMDUR) 60 MG 24 hr tablet, TAKE 1 TABLET EVERY DAY (Patient taking differently: Take 60 mg by mouth daily. ), Disp: 90 tablet, Rfl: 1 .  LANTUS SOLOSTAR 100 UNIT/ML Solostar Pen, Inject 10 Units into the skin at bedtime., Disp: , Rfl:  .  metFORMIN (GLUCOPHAGE) 500 MG  tablet, Take 500 mg by mouth daily. , Disp: , Rfl:  .  nitroGLYCERIN (NITROSTAT) 0.4 MG SL tablet, Place 1 tablet (0.4 mg total) under the tongue every 5 (five) minutes x 3 doses as needed for chest pain. (Patient taking differently: Place 0.4 mg under the tongue every 5 (five) minutes as needed for chest pain (max 3 doses). ), Disp: 25 tablet, Rfl: 2 .  omega-3 acid ethyl esters (LOVAZA) 1 g capsule, Take 1 capsule (1 g total) by mouth 2 (two) times daily., Disp: 180 capsule, Rfl: 3 .  ticagrelor (BRILINTA) 90 MG TABS tablet, Take 1 tablet (90 mg total) by mouth 2 (two) times daily., Disp: 60 tablet, Rfl: 0 .  vitamin B-12 (CYANOCOBALAMIN) 1000 MCG tablet, Take 1,000 mcg by mouth daily., Disp: , Rfl:   Exam: Current vital signs: BP (!) 159/89   Wt 63.8 kg   BMI 22.70 kg/m  Vital signs in last 24 hours: BP: (159)/(89) 159/89 (11/21 2209) Weight:  [63.8 kg] 63.8 kg (11/21 2209) General: Awake alert in no distress HEENT: Normocephalic atraumatic Lungs: Clear Cardiovascular: Regular rate rhythm Abdomen soft nondistended nontender Extremities warm well perfused Neurological exam She is awake alert and oriented to self. She was able to tell me her first and middle name but when I asked her her full name, she told me her maiden name and not her current last name. She had difficulty naming simple objects She was able to repeat some sentences but incompletely. Her speech was mildly dysarthric Cranial nerves: Pupils equal round react light, extraocular movement examination showed a right gaze preference, she was able to come to the midline but not go all the way to the left.  Questionable decrease in blink to threat from the left as well.  No facial asymmetry noted. Motor exam: She is antigravity in all fours but it seemed like the left upper extremity strength was 4+/5 in comparison to the right upper extremity which was 5/5. Sensory exam: Diminished on the left and extinction on the left on  double simultaneous stimulation. Coordination difficult to assess given her mentation and inconsistent attention span.  NIHSS 1a Level of Conscious.: 0 1b LOC Questions: 2 1c LOC Commands: 0 2 Best Gaze: 1 3 Visual: 1 4 Facial Palsy: 0 5a Motor Arm - left: 0 5b Motor Arm - Right: 0 6a Motor Leg - Left: 0 6b Motor Leg - Right: 0 7 Limb Ataxia: 0 8 Sensory: 1 9 Best Language: 1 10 Dysarthria: 1 11 Extinct. and Inatten.: 2 TOTAL: 9  Labs I have reviewed labs in epic and the results pertinent to this consultation are:  CBC    Component Value Date/Time   WBC 6.8 08/14/2020 0454   RBC 3.95 08/14/2020 0454   HGB 11.8 (L) 08/14/2020 0454   HGB 11.4 (L)  12/17/2012 1305   HCT 35.5 (L) 08/14/2020 0454   HCT 34.5 (L) 12/17/2012 1305   PLT 202 08/14/2020 0454   PLT 244 12/17/2012 1305   MCV 89.9 08/14/2020 0454   MCV 91 12/17/2012 1305   MCH 29.9 08/14/2020 0454   MCHC 33.2 08/14/2020 0454   RDW 12.0 08/14/2020 0454   RDW 14.1 12/17/2012 1305   LYMPHSABS 2.1 08/13/2020 1233   MONOABS 0.8 08/13/2020 1233   EOSABS 0.2 08/13/2020 1233   BASOSABS 0.1 08/13/2020 1233    CMP     Component Value Date/Time   NA 137 08/14/2020 0454   NA 139 12/17/2012 1305   K 3.8 08/14/2020 0454   K 4.0 12/17/2012 1305   CL 102 08/14/2020 0454   CL 102 12/17/2012 1305   CO2 26 08/14/2020 0454   CO2 29 12/17/2012 1305   GLUCOSE 196 (H) 08/14/2020 0454   GLUCOSE 151 (H) 12/17/2012 1305   BUN 11 08/14/2020 0454   BUN 15 12/17/2012 1305   CREATININE 1.13 (H) 08/14/2020 0454   CREATININE 1.00 12/17/2012 1305   CALCIUM 9.4 08/14/2020 0454   CALCIUM 9.2 12/17/2012 1305   PROT 6.5 08/13/2020 1233   PROT 8.1 12/17/2012 1305   ALBUMIN 3.5 08/13/2020 1233   ALBUMIN 3.7 12/17/2012 1305   AST 30 08/13/2020 1233   AST 23 12/17/2012 1305   ALT 29 08/13/2020 1233   ALT 17 12/17/2012 1305   ALKPHOS 49 08/13/2020 1233   ALKPHOS 54 12/17/2012 1305   BILITOT 0.4 08/13/2020 1233   BILITOT 0.4  12/17/2012 1305   GFRNONAA 48 (L) 08/14/2020 0454   GFRNONAA 55 (L) 12/17/2012 1305   GFRAA 34 (L) 08/18/2017 0852   GFRAA >60 12/17/2012 1305  EEG done on last admission: Nonspecific left hemispheric dysfunction.  Imaging I have reviewed the images obtained: CT-scan of the brain-ASPECTS 10, no bleed CTA H+N: Redemonstrated right ICA 75% stenosis, left ICA 50% stenosis.  No emergent LVO intracranially. CT perfusion.  Lots of motion and essentially unremarkable/nondiagnostic.  Assessment:  85 year old with above past medical history presenting for strokelike symptoms-initially right-sided facial droop and word finding difficulty which resolved followed by left-sided weakness and neglect-attributable to right hemispheric stroke. Recently admitted with concerns for TIA.  Work-up completed. Also had an EEG done which showed nonspecific intermittent left hemispheric dysfunction. I suspect that her bilateral carotid stenosis is likely etiology of both her presentation/5 days ago and today.  Plan: Acute Ischemic Stroke Bilateral carotid stenosis  Acuity: Acute Current Suspected Etiology: Large vessel versus cardioembolic Continue Evaluation:  -Admit to: Neurological ICU -Hold Aspirin until 24 hour post tPA neuroimaging is stable and without evidence of bleeding -Blood pressure control, goal of SYS <180 -MRI/ECHO/A1C/Lipid panel. -Hyperglycemia management per SSI to maintain glucose 140-180mg /dL. -PT/OT/ST therapies and recommendations when able  CNS Close neuro monitoring  Dysarthria Dysphagia following cerebral infarction  -NPO until cleared by speech -ST -Advance diet as tolerated -May need PEG   Hemiplegia and hemiparesis following cerebral infarction affecting left non-dominant side  -PT/OT -PM&R consult   RESP Breathing well saturating normally room air. No active issues Monitor clinically  CV Essential hypertension -Aggressive BP control, goal SBP <180 post TPA  protocol -Labetalol/Cleviprex drip as needed  Had a trans-thoracic echocardiogram done 08/14/2020 with LVEF 60 to 65%, normal RV systolic function, normal structure of the mitral valve, grossly normal aortic valve, normal left atrial size, no interatrial shunts detected by color-flow Doppler. No need to repeat echo Might need a  loop recorder  No need to repeat LDL or A1c-most recent LDL 23, A1c 8.9   HEME Iron Deficiency Anemia -Monitor -transfuse for hgb < 7  ENDO Type 2 diabetes mellitus with hyperglycemia  -SSI -goal HgbA1c < 7  GI/GU Based on chart review likely has CKD Stage 3 (GFR 30-59) -Gentle hydration -avoid nephrotoxic agents  Fluid/Electrolyte Disorders Today's labs pending. Check electrolytes and replete as necessary   ID Possible Aspiration PNA due to significant dysarthria and dysphagia -CXR -NPO -Monitor   Nutrition E66.9 Obesity  E46 Protein-Calorie Malnutrition Mild Moderate Severe -diet consult  Prophylaxis DVT: SCDs only GI: PPI Bowel: Doc senna  Diet: NPO until cleared by speech or formal swallow evaluation  Code Status: Full Code     THE FOLLOWING WERE PRESENT ON ADMISSION: Acute ischemic stroke, dysarthria, hemiparesis/hemiplegia, CKD 3, diabetes with hyperglycemia   Plan discussed with the son at bedside, and daughter over the phone. Plan also discussed with Dr. Tomi Bamberger as well as [place] Robinson, PA -- ED providers -- Amie Portland, MD [facility] Pager: [phone] If 7pm to 7am, please call on call as listed on AMION.   CRITICAL CARE ATTESTATION Performed by: Amie Portland, MD Total critical care time: 60 minutes Critical care time was exclusive of separately billable procedures and treating other patients and/or supervising APPs/Residents/Students Critical care was necessary to treat or prevent imminent or life-threatening deterioration due to acute ischemic stroke, possible aspiration pneumonia. This patient is  critically ill and at significant risk for neurological worsening and/or death and care requires constant monitoring. Critical care was time spent personally by me on the following activities: development of treatment plan with patient and/or surrogate as well as nursing, discussions with consultants, evaluation of patient's response to treatment, examination of patient, obtaining history from patient or surrogate, ordering and performing treatments and interventions, ordering and review of laboratory studies, ordering and review of radiographic studies, pulse oximetry, re-evaluation of patient's condition, participation in multidisciplinary rounds and medical decision making of high complexity in the care of this patient.

## 2020-08-19 NOTE — Progress Notes (Signed)
PHARMACIST CODE STROKE RESPONSE  Notified to mix tPA at 2209 by Dr. Rory Percy Delivered tPA to RN at 2212  tPA dose = 5.7mg  bolus over 1 minute followed by 51.7mg  for a total dose of 57.4mg  over 1 hour  Issues/delays encountered (if applicable): PT vomiting in [place] PharmD. BCPS 08/19/20 10:15 PM

## 2020-08-19 NOTE — Progress Notes (Addendum)
Notified pharmacist and Dr. Rory Percy that there is redness and swelling around the IV site where patient received TPA making it likely that the site infiltrated. Received Verbal order to continue  monitoring iv site and notify MD if it worsens.

## 2020-08-19 NOTE — Code Documentation (Signed)
Stroke Response Nurse Documentation Code Documentation  Shawna Lynch is a 85 y.o. female arriving to [place] ED via [facility] on 11/21 with past medical hx of Dementia, HTN, TIA, DM. Code stroke was activated by EMS. Patient from home where she was LKW at 1900 and now complaining of slurred speech, confusion and Left neglect . On Brilinta (ticagrelor) 90 mg bid. Stroke team at the bedside on patient arrival. Labs drawn and patient cleared for CT by Dr. Quentin Cornwall. Patient to CT with team. NIHSS 9 see documentation for details and code stroke times. Patient with disoriented, right gaze preference , left hemianopia, left decreased sensation, Expressive aphasia , dysarthria  and Sensory  neglect on exam. The following imaging was completed:  CTP. Patient is a candidate for tPA due to neuro deficit. Bedside handoff with ED RN Ubaldo Glassing.    Madelynn Done  Rapid Response RN

## 2020-08-19 NOTE — ED Notes (Signed)
This RN noticed blood in pt mouth. Shawna Craze, MD made aware and states to continue TPA and make him aware if it gets worse.

## 2020-08-19 NOTE — ED Provider Notes (Signed)
[facility] EMERGENCY DEPARTMENT Provider Note   CSN: [id number] Arrival date & time: 08/19/20  2150  An emergency department physician performed an initial assessment on this suspected stroke patient at 2154.  History Chief Complaint  Patient presents with  . Code Stroke    Shawna Lynch is a 85 y.o. female past medical history of CAD, TIA, dementia, hypertension, presenting via EMS from home as code stroke. EMS reports was walking across the kitchen when she stepped with stride and could go no further. She stopped responding verbally and began to fall, family caught her and set her in a chair. She had sided weakness and right-sided facial droop at the time which resolved after about 15 minutes. At 2130 she developed left-sided weakness with rightward gaze, left-sided neglect which has persisted. She is also noted to have aphasia.  Level 5 caveat due to aphasia, acuity of condition, dementia.   The history is provided by the EMS personnel. The history is limited by the condition of the patient.       Past Medical History:  Diagnosis Date  . Anxiety   . Arthritis    oa  . Coronary artery disease    04/06/17 PCI with DES to [facility], 90% in diag from LAD, nonobstructive disease in LAD, RCA. Normal EF.   [name] Dementia ([place])   . Depression   . Diabetes mellitus without complication ([place])   . Hypertension   . NSTEMI (non-ST elevated myocardial infarction) ([place])   . Ringing in ears   . TIA (transient ischemic attack)     Patient Active Problem List   Diagnosis Date Noted  . Acute ischemic stroke ([place]) 08/19/2020  . TIA (transient ischemic attack) 08/13/2020  . Mild cognitive impairment 03/09/2018  . Pruritus 03/09/2018  . CAD S/P percutaneous coronary angioplasty 04/27/2017  . Dementia ([place]) 04/27/2017  . Dyslipidemia 04/07/2017  . Non-insulin treated type 2 diabetes mellitus ([place]) 04/06/2017  . Essential hypertension 04/06/2017  . History of non-ST elevation  myocardial infarction (NSTEMI) 04/03/2017    Past Surgical History:  Procedure Laterality Date  . ABDOMINAL HYSTERECTOMY    . CORONARY STENT INTERVENTION  04/06/2017  . CORONARY STENT INTERVENTION N/A 04/06/2017   Procedure: Coronary Stent Intervention;  Surgeon: Sherren Mocha, MD;  Location: [place] CV LAB;  Service: Cardiovascular;  Laterality: N/A;  . LEFT HEART CATH AND CORONARY ANGIOGRAPHY N/A 04/06/2017   Procedure: Left Heart Cath and Coronary Angiography;  Surgeon: Sherren Mocha, MD;  Location: [facility];  Service: Cardiovascular;  Laterality: N/A;     OB History   No obstetric history on file.     Family History  Problem Relation Age of Onset  . CAD Father   . Cancer Sister   . Hearing loss Mother   . Hypertension Mother     Social History   Tobacco Use  . Smoking status: Former Research scientist (life sciences)  . Smokeless tobacco: Never Used  . Tobacco comment: quit "20 years ago"  Vaping Use  . Vaping Use: Never used  Substance Use Topics  . Alcohol use: No  . Drug use: No    Home Medications Prior to Admission medications   Medication Sig Start Date End Date Taking? Authorizing Provider  aspirin EC 81 MG EC tablet Take 1 tablet (81 mg total) by mouth daily. 04/07/17   [name], NP  atorvastatin (LIPITOR) 80 MG tablet Take 1 tablet (80 mg total) by mouth daily at 6 PM. 04/07/17  [name], NP  carvedilol (COREG) 25 MG tablet Take 12.5 mg by mouth 2 (two) times daily with a meal.    [provider]  cholecalciferol (VITAMIN D) 1000 units tablet Take 1,000 Units by mouth daily.    [provider]  Dulaglutide (TRULICITY [place]) Inject 3 mg into the skin every Monday.     [provider]  escitalopram (LEXAPRO) 10 MG tablet Take 10 mg by mouth daily. 06/25/20   [provider]  isosorbide mononitrate (IMDUR) 60 MG 24 hr tablet TAKE 1 TABLET EVERY DAY Patient taking differently: Take 60 mg by mouth daily.  01/12/19    Minus Breeding, MD  LANTUS SOLOSTAR 100 UNIT/ML Solostar Pen Inject 10 Units into the skin at bedtime. 06/17/20   [provider]  metFORMIN (GLUCOPHAGE) 500 MG tablet Take 500 mg by mouth daily.  08/20/19   [provider]  nitroGLYCERIN (NITROSTAT) 0.4 MG SL tablet Place 1 tablet (0.4 mg total) under the tongue every 5 (five) minutes x 3 doses as needed for chest pain. Patient taking differently: Place 0.4 mg under the tongue every 5 (five) minutes as needed for chest pain (max 3 doses).  04/07/17   [name], NP  omega-3 acid ethyl esters (LOVAZA) 1 g capsule Take 1 capsule (1 g total) by mouth 2 (two) times daily. 04/27/17   Erlene Quan, PA-C  ticagrelor (BRILINTA) 90 MG TABS tablet Take 1 tablet (90 mg total) by mouth 2 (two) times daily. 08/14/20   Maness, Arnette Norris, MD  vitamin B-12 (CYANOCOBALAMIN) 1000 MCG tablet Take 1,000 mcg by mouth daily.    [provider]    Allergies    Penicillins, Sulfa antibiotics, Vicodin [hydrocodone-acetaminophen], and Codeine  Review of Systems   Review of Systems  Unable to perform ROS: Acuity of condition    Physical Exam Updated Vital Signs BP (!) 174/91   Pulse 90   Temp 97.7 F (36.5 C)   Resp 15   Wt 63.8 kg   SpO2 97%   BMI 22.70 kg/m   Physical Exam Vitals and nursing note reviewed.  Constitutional:      Appearance: She is well-developed.  HENT:     Head: Normocephalic and atraumatic.  Eyes:     Conjunctiva/sclera: Conjunctivae normal.  Cardiovascular:     Rate and Rhythm: Normal rate and regular rhythm.  Pulmonary:     Effort: Pulmonary effort is normal. No respiratory distress.     Comments: Tolerating secretions Abdominal:     Palpations: Abdomen is soft.  Skin:    General: Skin is warm.  Neurological:     Mental Status: She is alert.     Comments: Patient is alert, oriented to first name and birth month.  She is having some difficulty following simple commands, therefore neuro exam  is limited. Rightward gaze is noted  with left sided neglect however patient withdraws to pain to the LUE and LLE. Patient with some mild weakness to left upper and lower extremities when compared to right.   Psychiatric:        Behavior: Behavior normal.     ED Results / Procedures / Treatments   Labs (all labs ordered are listed, but only abnormal results are displayed) Labs Reviewed  CBC - Abnormal; Notable for the following components:      Result Value   Hemoglobin 11.8 (*)    All other components within normal limits  CBG MONITORING, ED - Abnormal; Notable for the  following components:   Glucose-Capillary 238 (*)    All other components within normal limits  I-STAT CHEM 8, ED - Abnormal; Notable for the following components:   Sodium 134 (*)    Chloride 96 (*)    Creatinine, Ser 1.30 (*)    Glucose, Bld 237 (*)    Calcium, Ion 1.14 (*)    Hemoglobin 11.9 (*)    HCT 35.0 (*)    All other components within normal limits  RESPIRATORY PANEL BY RT PCR (FLU A&B, COVID)  DIFFERENTIAL  PROTIME-INR  APTT  COMPREHENSIVE METABOLIC PANEL  CBG MONITORING, ED    EKG EKG Interpretation  Date/Time:  Sunday August 19 2020 22:47:36 EST Ventricular Rate:  93 PR Interval:    QRS Duration: 103 QT Interval:  365 QTC Calculation: 093 R Axis:   33 Text Interpretation: Sinus rhythm Prolonged PR interval No significant change since last tracing Confirmed by Dorie Rank [phone]) on 08/19/2020 10:52:46 PM   Radiology CT Code Stroke CTA Head W/WO contrast  Result Date: 08/19/2020 CLINICAL DATA:  Initial evaluation for possible stroke, initially presenting with right-sided weakness, speech difficulty, now with left-sided neglect. EXAM: CT ANGIOGRAPHY HEAD AND NECK CT PERFUSION BRAIN TECHNIQUE: Multidetector CT imaging of the head and neck was performed using the standard protocol during bolus administration of intravenous contrast. Multiplanar CT image reconstructions and MIPs were  obtained to evaluate the vascular anatomy. Carotid stenosis measurements (when applicable) are obtained utilizing NASCET criteria, using the distal internal carotid diameter as the denominator. Multiphase CT imaging of the brain was performed following IV bolus contrast injection. Subsequent parametric perfusion maps were calculated using RAPID software. CONTRAST:  42mL OMNIPAQUE IOHEXOL 350 MG/ML SOLN COMPARISON:  Prior CT from earlier same day as well as recent CTA from 08/14/2020. FINDINGS: CTA NECK FINDINGS Aortic arch: Visualized arch of normal caliber with normal 3 vessel morphology. Atheromatous change about the arch itself and origin of the great vessels without hemodynamically significant stenosis. Right carotid system: Right CCA patent from its origin to the bifurcation without stenosis or other abnormality. Eccentric calcified plaque at the origin of the right ICA with up to 75% stenosis by NASCET criteria, relatively stable from previous. Multifocal irregularity within the distal cervical left ICA noted as well, which could be atheromatous related or possibly related to mild changes of FMD, also stable (series 7, image 178). No associated stenosis, dissection, or other complication. Left carotid system: Left CCA patent from its origin to the bifurcation without stenosis. Bulky calcified plaque at the origin of the left ICA with associated stenosis of up to 50% by NASCET criteria, stable from previous. Left ICA otherwise patent distally to the skull base without stenosis, dissection or occlusion. Vertebral arteries: Both vertebral arteries arise from the subclavian arteries. No proximal subclavian artery stenosis. Both vertebral arteries patent within the neck without stenosis, dissection or occlusion. Skeleton: No visible acute osseous abnormality. Advanced degenerative changes about the C1-2 articulation. Moderate multilevel cervical spondylosis noted elsewhere. Visualized osseous structures demonstrate  a somewhat mottled appearance. Other neck: No other acute soft tissue abnormality within the neck. No mass lesion or adenopathy. Upper chest: Visualized upper chest demonstrates no acute finding. Partially visualized lungs are grossly clear. Review of the MIP images confirms the above findings CTA HEAD FINDINGS Anterior circulation: Petrous segments widely patent bilaterally. Mild atheromatous change within the carotid siphons without hemodynamically significant stenosis. A1 segments widely patent. Normal anterior communicating artery complex. Anterior cerebral arteries widely patent bilaterally. Normal in stenosis or  occlusion. Negative MCA bifurcations. No proximal MCA branch occlusion. Distal MCA branches well perfused. Posterior circulation: Vertebral arteries patent to the vertebrobasilar junction without stenosis. Patent left PICA. Right PICA not seen. Basilar patent to its distal aspect without stenosis. Superior cerebral arteries patent bilaterally. Both PCAs primarily supplied via the basilar well perfused or distal aspects. Venous sinuses: Grossly patent allowing for timing the contrast bolus. Anatomic variants: None significant.  No aneurysm. Review of the MIP images confirms the above findings CT Brain Perfusion Findings: ASPECTS: 10. CBF (<30%) Volume: 36mL Perfusion (Tmax>6.0s) volume: 81mL Mismatch Volume: 33mL Infarction Location:Negative CT perfusion for acute core infarct. Apparent 8 cc perfusion deficit favored to be artifactual, as there is extensive motion artifact on this exam. IMPRESSION: 1. Negative CTA for emergent large vessel occlusion. 2. Negative CT perfusion with no evidence for acute core infarct. No definite perfusion abnormality, although examination limited by motion. 3. Atheromatous change about the carotid bifurcations bilaterally, with associated stenoses of up to 75% on the right and 50% on the left, stable. Critical Value/emergent results were called by telephone at the time of  interpretation on 08/19/2020 at approximately 10:25 pm to provider [facility] , who verbally acknowledged these results. Electronically Signed   By: Jeannine Boga M.D.   On: 08/19/2020 22:54   CT Code Stroke CTA Neck W/WO contrast  Result Date: 08/19/2020 CLINICAL DATA:  Initial evaluation for possible stroke, initially presenting with right-sided weakness, speech difficulty, now with left-sided neglect. EXAM: CT ANGIOGRAPHY HEAD AND NECK CT PERFUSION BRAIN TECHNIQUE: Multidetector CT imaging of the head and neck was performed using the standard protocol during bolus administration of intravenous contrast. Multiplanar CT image reconstructions and MIPs were obtained to evaluate the vascular anatomy. Carotid stenosis measurements (when applicable) are obtained utilizing NASCET criteria, using the distal internal carotid diameter as the denominator. Multiphase CT imaging of the brain was performed following IV bolus contrast injection. Subsequent parametric perfusion maps were calculated using RAPID software. CONTRAST:  41mL OMNIPAQUE IOHEXOL 350 MG/ML SOLN COMPARISON:  Prior CT from earlier same day as well as recent CTA from 08/14/2020. FINDINGS: CTA NECK FINDINGS Aortic arch: Visualized arch of normal caliber with normal 3 vessel morphology. Atheromatous change about the arch itself and origin of the great vessels without hemodynamically significant stenosis. Right carotid system: Right CCA patent from its origin to the bifurcation without stenosis or other abnormality. Eccentric calcified plaque at the origin of the right ICA with up to 75% stenosis by NASCET criteria, relatively stable from previous. Multifocal irregularity within the distal cervical left ICA noted as well, which could be atheromatous related or possibly related to mild changes of FMD, also stable (series 7, image 178). No associated stenosis, dissection, or other complication. Left carotid system: Left CCA patent from its origin to  the bifurcation without stenosis. Bulky calcified plaque at the origin of the left ICA with associated stenosis of up to 50% by NASCET criteria, stable from previous. Left ICA otherwise patent distally to the skull base without stenosis, dissection or occlusion. Vertebral arteries: Both vertebral arteries arise from the subclavian arteries. No proximal subclavian artery stenosis. Both vertebral arteries patent within the neck without stenosis, dissection or occlusion. Skeleton: No visible acute osseous abnormality. Advanced degenerative changes about the C1-2 articulation. Moderate multilevel cervical spondylosis noted elsewhere. Visualized osseous structures demonstrate a somewhat mottled appearance. Other neck: No other acute soft tissue abnormality within the neck. No mass lesion or adenopathy. Upper chest: Visualized upper chest demonstrates no acute finding.  Partially visualized lungs are grossly clear. Review of the MIP images confirms the above findings CTA HEAD FINDINGS Anterior circulation: Petrous segments widely patent bilaterally. Mild atheromatous change within the carotid siphons without hemodynamically significant stenosis. A1 segments widely patent. Normal anterior communicating artery complex. Anterior cerebral arteries widely patent bilaterally. Normal in stenosis or occlusion. Negative MCA bifurcations. No proximal MCA branch occlusion. Distal MCA branches well perfused. Posterior circulation: Vertebral arteries patent to the vertebrobasilar junction without stenosis. Patent left PICA. Right PICA not seen. Basilar patent to its distal aspect without stenosis. Superior cerebral arteries patent bilaterally. Both PCAs primarily supplied via the basilar well perfused or distal aspects. Venous sinuses: Grossly patent allowing for timing the contrast bolus. Anatomic variants: None significant.  No aneurysm. Review of the MIP images confirms the above findings CT Brain Perfusion Findings: ASPECTS: 10.  CBF (<30%) Volume: 34mL Perfusion (Tmax>6.0s) volume: 86mL Mismatch Volume: 33mL Infarction Location:Negative CT perfusion for acute core infarct. Apparent 8 cc perfusion deficit favored to be artifactual, as there is extensive motion artifact on this exam. IMPRESSION: 1. Negative CTA for emergent large vessel occlusion. 2. Negative CT perfusion with no evidence for acute core infarct. No definite perfusion abnormality, although examination limited by motion. 3. Atheromatous change about the carotid bifurcations bilaterally, with associated stenoses of up to 75% on the right and 50% on the left, stable. Critical Value/emergent results were called by telephone at the time of interpretation on 08/19/2020 at approximately 10:25 pm to provider [facility] , who verbally acknowledged these results. Electronically Signed   By: Jeannine Boga M.D.   On: 08/19/2020 22:54   CT Code Stroke Cerebral Perfusion with contrast  Result Date: 08/19/2020 CLINICAL DATA:  Initial evaluation for possible stroke, initially presenting with right-sided weakness, speech difficulty, now with left-sided neglect. EXAM: CT ANGIOGRAPHY HEAD AND NECK CT PERFUSION BRAIN TECHNIQUE: Multidetector CT imaging of the head and neck was performed using the standard protocol during bolus administration of intravenous contrast. Multiplanar CT image reconstructions and MIPs were obtained to evaluate the vascular anatomy. Carotid stenosis measurements (when applicable) are obtained utilizing NASCET criteria, using the distal internal carotid diameter as the denominator. Multiphase CT imaging of the brain was performed following IV bolus contrast injection. Subsequent parametric perfusion maps were calculated using RAPID software. CONTRAST:  39mL OMNIPAQUE IOHEXOL 350 MG/ML SOLN COMPARISON:  Prior CT from earlier same day as well as recent CTA from 08/14/2020. FINDINGS: CTA NECK FINDINGS Aortic arch: Visualized arch of normal caliber with normal 3  vessel morphology. Atheromatous change about the arch itself and origin of the great vessels without hemodynamically significant stenosis. Right carotid system: Right CCA patent from its origin to the bifurcation without stenosis or other abnormality. Eccentric calcified plaque at the origin of the right ICA with up to 75% stenosis by NASCET criteria, relatively stable from previous. Multifocal irregularity within the distal cervical left ICA noted as well, which could be atheromatous related or possibly related to mild changes of FMD, also stable (series 7, image 178). No associated stenosis, dissection, or other complication. Left carotid system: Left CCA patent from its origin to the bifurcation without stenosis. Bulky calcified plaque at the origin of the left ICA with associated stenosis of up to 50% by NASCET criteria, stable from previous. Left ICA otherwise patent distally to the skull base without stenosis, dissection or occlusion. Vertebral arteries: Both vertebral arteries arise from the subclavian arteries. No proximal subclavian artery stenosis. Both vertebral arteries patent within the neck without stenosis,  dissection or occlusion. Skeleton: No visible acute osseous abnormality. Advanced degenerative changes about the C1-2 articulation. Moderate multilevel cervical spondylosis noted elsewhere. Visualized osseous structures demonstrate a somewhat mottled appearance. Other neck: No other acute soft tissue abnormality within the neck. No mass lesion or adenopathy. Upper chest: Visualized upper chest demonstrates no acute finding. Partially visualized lungs are grossly clear. Review of the MIP images confirms the above findings CTA HEAD FINDINGS Anterior circulation: Petrous segments widely patent bilaterally. Mild atheromatous change within the carotid siphons without hemodynamically significant stenosis. A1 segments widely patent. Normal anterior communicating artery complex. Anterior cerebral arteries  widely patent bilaterally. Normal in stenosis or occlusion. Negative MCA bifurcations. No proximal MCA branch occlusion. Distal MCA branches well perfused. Posterior circulation: Vertebral arteries patent to the vertebrobasilar junction without stenosis. Patent left PICA. Right PICA not seen. Basilar patent to its distal aspect without stenosis. Superior cerebral arteries patent bilaterally. Both PCAs primarily supplied via the basilar well perfused or distal aspects. Venous sinuses: Grossly patent allowing for timing the contrast bolus. Anatomic variants: None significant.  No aneurysm. Review of the MIP images confirms the above findings CT Brain Perfusion Findings: ASPECTS: 10. CBF (<30%) Volume: 26mL Perfusion (Tmax>6.0s) volume: 11mL Mismatch Volume: 18mL Infarction Location:Negative CT perfusion for acute core infarct. Apparent 8 cc perfusion deficit favored to be artifactual, as there is extensive motion artifact on this exam. IMPRESSION: 1. Negative CTA for emergent large vessel occlusion. 2. Negative CT perfusion with no evidence for acute core infarct. No definite perfusion abnormality, although examination limited by motion. 3. Atheromatous change about the carotid bifurcations bilaterally, with associated stenoses of up to 75% on the right and 50% on the left, stable. Critical Value/emergent results were called by telephone at the time of interpretation on 08/19/2020 at approximately 10:25 pm to provider [facility] , who verbally acknowledged these results. Electronically Signed   By: Jeannine Boga M.D.   On: 08/19/2020 22:54   CT HEAD CODE STROKE WO CONTRAST  Result Date: 08/19/2020 CLINICAL DATA:  Code stroke. Initial evaluation for acute right-sided facial droop. EXAM: CT HEAD WITHOUT CONTRAST TECHNIQUE: Contiguous axial images were obtained from the base of the skull through the vertex without intravenous contrast. COMPARISON:  Prior MRI and CTA from 08/14/2020. FINDINGS: Brain: Atrophy  with chronic microvascular ischemic disease, stable. No acute intracranial hemorrhage. No acute large vessel territory infarct. No mass lesion, midline shift or mass effect. Ventricular prominence related to global parenchymal volume loss without hydrocephalus. No extra-axial fluid collection. Vascular: No hyperdense vessel. Calcified atherosclerosis present at skull base. Skull: Scalp soft tissues and calvarium within normal limits. Sinuses/Orbits: Globes and orbital soft tissues demonstrate no acute finding. Paranasal sinuses are clear. Trace bilateral mastoid effusions noted, of doubtful significance. Other: None. ASPECTS [facility] Stroke Program Early CT Score) - Ganglionic level infarction (caudate, lentiform nuclei, internal capsule, insula, M1-M3 cortex): 7 - Supraganglionic infarction (M4-M6 cortex): 3 Total score (0-10 with 10 being normal): 10 IMPRESSION: 1. No acute intracranial infarct or other abnormality. 2. ASPECTS is 10. 3. Atrophy with chronic microvascular ischemic disease, stable. These results were communicated to Dr. Rory Percy at [place] 11/21/2021by text page via the [facility] messaging system. Electronically Signed   By: Jeannine Boga M.D.   On: 08/19/2020 22:19    Procedures .Critical Care Performed by: Akshaya Toepfer, [place] N, PA-C Authorized by: Schae Cando, [place] N, PA-C   Critical care provider statement:    Critical care time (minutes):  45   Critical care time was exclusive of:  Separately billable procedures and treating other patients and teaching time   Critical care was necessary to treat or prevent imminent or life-threatening deterioration of the following conditions:  CNS failure or compromise (stroke)   Critical care was time spent personally by me on the following activities:  Discussions with consultants, evaluation of patient's response to treatment, examination of patient, ordering and performing treatments and interventions, ordering and review of laboratory studies,  ordering and review of radiographic studies, pulse oximetry, re-evaluation of patient's condition, obtaining history from patient or surrogate and review of old charts   I assumed direction of critical care for this patient from another provider in my specialty: no     (including critical care time)  Medications Ordered in ED Medications  sodium chloride flush (NS) 0.9 % injection 3 mL (has no administration in time range)  alteplase (ACTIVASE) 1 mg/mL infusion 57.4 mg (57.4 mg Intravenous New Bag/Given 08/19/20 2213)    Followed by  0.9 %  sodium chloride infusion (has no administration in time range)   stroke: mapping our early stages of recovery book (has no administration in time range)  0.9 %  sodium chloride infusion (has no administration in time range)  acetaminophen (TYLENOL) tablet 650 mg (has no administration in time range)    Or  acetaminophen (TYLENOL) 160 MG/5ML solution 650 mg (has no administration in time range)    Or  acetaminophen (TYLENOL) suppository 650 mg (has no administration in time range)  senna-docusate (Senokot-S) tablet 1 tablet (has no administration in time range)  pantoprazole (PROTONIX) injection 40 mg (has no administration in time range)  labetalol (NORMODYNE) injection 20 mg (has no administration in time range)    And  clevidipine (CLEVIPREX) infusion 0.5 mg/mL (has no administration in time range)  ondansetron (ZOFRAN) injection 4 mg (4 mg Intravenous Given 08/19/20 2201)  iohexol (OMNIPAQUE) 350 MG/ML injection 75 mL (75 mLs Intravenous Contrast Given 08/19/20 2216)    ED Course  I have reviewed the triage vital signs and the nursing notes.  Pertinent labs & imaging results that were available during my care of the patient were reviewed by me and considered in my medical decision making (see chart for details).    MDM Rules/Calculators/A&P                         Patient presenting via EMS as code stroke, last known well 6 PM today.  On  arrival, patient has left-sided neglect with rightward gaze.  There is also some appreciated weakness to left upper and lower extremities.  Difficulty following commands due to confusion.   CTA with 75% stenosis right carotid, 50% left. Presentation suspected due to hypoperfusion. Neuro administered tPA and will admit to neuro ICU for further management.  Final Clinical Impression(s) / ED Diagnoses Final diagnoses:  Acute ischemic stroke [facility])    Rx / [place] Orders ED Discharge Orders    None       Neftali Abair, [place] N, PA-C 08/19/20 2317    Dorie Rank, MD 08/20/20 [phone]

## 2020-08-19 NOTE — ED Triage Notes (Signed)
BIB [facility] from home. At 1900 pt started having a right side facial droop. Suddenly starts to forget hoe to walk for about 15 mins. Then it resolved on its own. 2030 had onset of left side arm weakness and asaphia with right sided gaze.

## 2020-08-20 ENCOUNTER — Inpatient Hospital Stay (HOSPITAL_COMMUNITY): Payer: Medicare HMO

## 2020-08-20 DIAGNOSIS — I639 Cerebral infarction, unspecified: Principal | ICD-10-CM

## 2020-08-20 MED ORDER — PANTOPRAZOLE SODIUM 40 MG PO TBEC
40.0000 mg | DELAYED_RELEASE_TABLET | Freq: Every day | ORAL | Status: DC
  Administered 2020-08-20 – 2020-08-21 (×2): 40 mg via ORAL
  Filled 2020-08-20 (×2): qty 1

## 2020-08-20 MED ORDER — ONDANSETRON HCL 4 MG/2ML IJ SOLN
4.0000 mg | Freq: Four times a day (QID) | INTRAMUSCULAR | Status: DC | PRN
  Administered 2020-08-20 (×2): 4 mg via INTRAVENOUS
  Filled 2020-08-20: qty 2

## 2020-08-20 NOTE — Evaluation (Signed)
Occupational Therapy Evaluation Patient Details Name: Shawna Lynch MRN: [id number] DOB: 1935-01-26 Today's Date: 08/20/2020    History of Present Illness The pt is an 85 yo female presenting from home with L-sided weakness, righward gaze, and L-sided neglect. The pt received tPA upon arrival, but imaging received after arrivalindicated no acute abnormality. PMH includes: dementia, CAD, DM II, HTN, NSTEMI, and multiple TIAs.    Clinical Impression   This 85 y/o female presents with the above. PTA pt living alone and performing mobility tasks without AD, receiving daily assist from family/aides for iADL tasks and bathing. Pt currently presenting with the above and below listed deficits. She requires initially modA (+2) for functional transfers progressing to minA (intermittently +2) throughout session. Pt with increased weakness, fatigue, and son endorsing worsened cognition compared to baseline. She requires up to maxA for LB ADL, minA for seated UB ADL. VSS throughout. Pt to benefit from continued acute OT services, given current deficits recommend follow up therapy services in SNF setting to maximize her safety and independence with ADL and mobility.     Follow Up Recommendations  SNF;Supervision/Assistance - 24 hour (family hopeful for [facility] at [facility])    Equipment Recommendations  Other (comment);3 in 1 bedside commode (defer to next venue)           Precautions / Restrictions Precautions Precautions: Fall Precaution Comments: dementia at baseline Restrictions Weight Bearing Restrictions: No      Mobility Bed Mobility Overal bed mobility: Needs Assistance Bed Mobility: Supine to Sit     Supine to sit: Min assist     General bed mobility comments: minA with significant cues to come to sitting EOB. minA to BLE and minA to rasie trunk from EOB    Transfers Overall transfer level: Needs assistance Equipment used: Rolling walker (2 wheeled) Transfers: Sit to/from  Stand Sit to Stand: Mod assist         General transfer comment: initially modA of 2, progressed to minA/modA of 1 with reps through session. strong posterior lean/legs on bed. slow to rise    Balance Overall balance assessment: Needs assistance Sitting-balance support: No upper extremity supported;Feet supported Sitting balance-Leahy Scale: Fair     Standing balance support: Bilateral upper extremity supported Standing balance-Leahy Scale: Poor Standing balance comment: reliant on BUE support and minA to maintain balance                           ADL either performed or assessed with clinical judgement   ADL Overall ADL's : Needs assistance/impaired Eating/Feeding: Set up;Sitting   Grooming: Set up;Supervision/safety;Sitting   Upper Body Bathing: Minimal assistance;Sitting   Lower Body Bathing: Moderate assistance;Sit to/from stand   Upper Body Dressing : Minimal assistance;Sitting   Lower Body Dressing: Maximal assistance;Sit to/from stand Lower Body Dressing Details (indicate cue type and reason): donning new briefs Toilet Transfer: Minimal assistance;+2 for safety/equipment;+2 for physical assistance;BSC;RW Toilet Transfer Details (indicate cue type and reason): BSC over toilet Toileting- Clothing Manipulation and Hygiene: Maximal assistance;+2 for physical assistance;Sitting/lateral lean;Sit to/from stand Toileting - Clothing Manipulation Details (indicate cue type and reason): assist for thoroughness with pericare and clothing management      Functional mobility during ADLs: Minimal assistance;+2 for physical assistance;+2 for safety/equipment;Rolling walker       Vision Baseline Vision/History: Wears glasses Wears Glasses: Reading only       Perception     Praxis      Pertinent Vitals/Pain Pain  Assessment: Faces Faces Pain Scale: Hurts a little bit Pain Location: generalized stiffness Pain Descriptors / Indicators: Discomfort Pain  Intervention(s): Limited activity within patient's tolerance;Monitored during session;Repositioned     Hand Dominance Right   Extremity/Trunk Assessment Upper Extremity Assessment Upper Extremity Assessment: Generalized weakness   Lower Extremity Assessment Lower Extremity Assessment: Defer to PT evaluation   Cervical / Trunk Assessment Cervical / Trunk Assessment: Kyphotic   Communication Communication Communication: No difficulties   Cognition Arousal/Alertness: Awake/alert Behavior During Therapy: WFL for tasks assessed/performed Overall Cognitive Status: History of cognitive impairments - at baseline Area of Impairment: Orientation;Memory;Following commands;Safety/judgement;Problem solving                 Orientation Level: Disoriented to;Place;Time;Situation   Memory: Decreased short-term memory Following Commands: Follows one step commands with increased time Safety/Judgement: Decreased awareness of safety;Decreased awareness of deficits   Problem Solving: Slow processing;Requires verbal cues General Comments: dementia at baseline, son states she seems a little more "foggy" than normal, pt with almost impulsive behaviors at times while agreeable at other times through session   General Comments  VSS on RA, son present and supportive    Exercises     Shoulder Instructions      Home Living Family/patient expects to be discharged to:: Private residence Living Arrangements: Alone Available Help at Discharge: Personal care attendant;Family;Available PRN/intermittently Type of Home: House Home Access: Level entry     Home Layout: One level     Bathroom Shower/Tub: Occupational psychologist: Standard     Home Equipment: Environmental consultant - 2 wheels;Cane - single point;Shower seat;Grab bars - tub/shower   Additional Comments: pt son present to verify information, states pt has aide during the day (8-5) and family can visit in morning, evening, and overnight  if needed      Prior Functioning/Environment Level of Independence: Needs assistance  Gait / Transfers Assistance Needed: Independent with ambulation ADL's / Homemaking Assistance Needed: Aide assists with bathing and IADLs.             OT Problem List: Decreased strength;Decreased range of motion;Decreased activity tolerance;Impaired balance (sitting and/or standing);Decreased cognition;Decreased safety awareness;Decreased knowledge of use of DME or AE;Decreased knowledge of precautions      OT Treatment/Interventions: Self-care/ADL training;Therapeutic exercise;Energy conservation;DME and/or AE instruction;Cognitive remediation/compensation;Therapeutic activities;Patient/family education;Balance training    OT Goals(Current goals can be found in the care plan section) Acute Rehab OT Goals Patient Stated Goal: to go home OT Goal Formulation: With patient Time For Goal Achievement: 09/03/20 Potential to Achieve Goals: Good  OT Frequency: Min 2X/week   Barriers to D/C:            Co-evaluation PT/OT/SLP Co-Evaluation/Treatment: Yes Reason for Co-Treatment: Necessary to address cognition/behavior during functional activity;For patient/therapist safety;To address functional/ADL transfers PT goals addressed during session: Mobility/safety with mobility;Balance;Proper use of DME OT goals addressed during session: ADL's and self-care      AM-PAC OT "6 Clicks" Daily Activity     Outcome Measure Help from another person eating meals?: A Little Help from another person taking care of personal grooming?: A Little Help from another person toileting, which includes using toliet, bedpan, or urinal?: A Lot Help from another person bathing (including washing, rinsing, drying)?: A Lot Help from another person to put on and taking off regular upper body clothing?: A Little Help from another person to put on and taking off regular lower body clothing?: A Lot 6 Click Score: 15   End of  Session Equipment  Utilized During Treatment: Gait belt;Rolling walker Nurse Communication: Mobility status  Activity Tolerance: Patient tolerated treatment well Patient left: in chair;with call bell/phone within reach;with chair alarm set;with family/visitor present  OT Visit Diagnosis: Unsteadiness on feet (R26.81);Muscle weakness (generalized) (M62.81);Other symptoms and signs involving cognitive function                Time: 1411-1450 OT Time Calculation (min): 39 min Charges:  OT General Charges $OT Visit: 1 Visit OT Evaluation $OT Eval Moderate Complexity: [place], OT Acute Rehabilitation Services Pager [phone] Office [phone]   Raymondo Band 08/20/2020, 5:46 PM

## 2020-08-20 NOTE — Evaluation (Signed)
Clinical/Bedside Swallow Evaluation Patient Details  Name: Shawna Lynch MRN: [id number] Date of Birth: 1934/12/11  Today's Date: 08/20/2020 Time: SLP Start Time (ACUTE ONLY): 1153 SLP Stop Time (ACUTE ONLY): 1205 SLP Time Calculation (min) (ACUTE ONLY): 12 min  Past Medical History:  Past Medical History:  Diagnosis Date  . Anxiety   . Arthritis    oa  . Coronary artery disease    04/06/17 PCI with DES to [facility], 90% in diag from LAD, nonobstructive disease in LAD, RCA. Normal EF.   [name] Dementia ([place])   . Depression   . Diabetes mellitus without complication ([place])   . Hypertension   . NSTEMI (non-ST elevated myocardial infarction) ([place])   . Ringing in ears   . TIA (transient ischemic attack)    Past Surgical History:  Past Surgical History:  Procedure Laterality Date  . ABDOMINAL HYSTERECTOMY    . CORONARY STENT INTERVENTION  04/06/2017  . CORONARY STENT INTERVENTION N/A 04/06/2017   Procedure: Coronary Stent Intervention;  Surgeon: Sherren Mocha, MD;  Location: [place] CV LAB;  Service: Cardiovascular;  Laterality: N/A;  . LEFT HEART CATH AND CORONARY ANGIOGRAPHY N/A 04/06/2017   Procedure: Left Heart Cath and Coronary Angiography;  Surgeon: Sherren Mocha, MD;  Location: [place] CV LAB;  Service: Cardiovascular;  Laterality: N/A;   HPI:  Shawna Lynch is a 85 y.o. female past medical history of dementia, coronary artery disease, diabetes, hypertension, presented to the emergency room for evaluation of sudden onset of right-sided weakness which was resolved after a little while and then had sudden onset of left-sided weakness and neglect.The patient was recently seen for a left hemispheric TIA when she had presented on 08/14/2020 with right facial droop and right-sided weakness transient symptoms.  CXR 11/21: "Findings concerning for volume overload with developing pulmonaryedema. Superimposed atypical infectious process is not excluded inthe appropriate clinical setting."  MRI  11/22 with no acute findings   Assessment / Plan / Recommendation Clinical Impression  Pt presents with grossly functional swallowing as assessed clinically.  Pt tolerated all consistencies triald with no clinical s/s of aspiration and exhibited good oral clearance of solids.  Pt did have some cognitive difficulty with feeding.  She was unable to close her lips around edge of cup for bolus retrieval, but was able to siphon liquid through straw.   Recommend regular texture diet with thin liquids. Pt may need assistance with feeding.   SLP Visit Diagnosis: Dysphagia, unspecified (R13.10)    Aspiration Risk  No limitations    Diet Recommendation Regular;Thin liquid   Liquid Administration via: Cup;Straw Medication Administration: Whole meds with liquid Supervision: Staff to assist with self feeding Compensations: Slow rate;Small sips/bites Postural Changes: Seated upright at 90 degrees    Other  Recommendations Oral Care Recommendations: Oral care BID   Follow up Recommendations  (TBD)      Frequency and Duration min 2x/week  2 weeks       Prognosis Prognosis for Safe Diet Advancement: Good      Swallow Study   General HPI: Shawna Lynch is a 85 y.o. female past medical history of dementia, coronary artery disease, diabetes, hypertension, presented to the emergency room for evaluation of sudden onset of right-sided weakness which was resolved after a little while and then had sudden onset of left-sided weakness and neglect.The patient was recently seen for a left hemispheric TIA when she had presented on 08/14/2020 with right facial droop and right-sided weakness transient symptoms.  CXR 11/21: "  Findings concerning for volume overload with developing pulmonaryedema. Superimposed atypical infectious process is not excluded inthe appropriate clinical setting."  MRI 11/22 with no acute findings Type of Study: Bedside Swallow Evaluation Previous Swallow Assessment: None Diet Prior to  this Study: NPO Temperature Spikes Noted: No Respiratory Status: Nasal cannula History of Recent Intubation: No Behavior/Cognition: Lethargic/Drowsy Oral Cavity Assessment: Within Functional Limits Oral Care Completed by SLP: No Oral Cavity - Dentition: Adequate natural dentition Patient Positioning: Upright in bed Baseline Vocal Quality: Normal Volitional Cough: Weak Volitional Swallow: Able to elicit    Oral/Motor/Sensory Function Overall Oral Motor/Sensory Function: Within functional limits Facial ROM: Within Functional Limits Facial Symmetry: Within Functional Limits Lingual ROM: Within Functional Limits Lingual Symmetry: Within Functional Limits Lingual Strength: Within Functional Limits Velum: Within Functional Limits Mandible: Within Functional Limits   Ice Chips Ice chips: Not tested   Thin Liquid Thin Liquid: Within functional limits Presentation: Straw    Nectar Thick Nectar Thick Liquid: Not tested   Honey Thick Honey Thick Liquid: Not tested   Puree Puree: Within functional limits Presentation: Spoon   Solid     Solid: Within functional limits Presentation:  (SLP fed)      Celedonio Savage, [place], [place] Office: [phone] 08/20/2020,12:21 PM

## 2020-08-20 NOTE — Plan of Care (Addendum)
H&H stable Reamins nauseous. Had brownish vomiting. Ordered zofran Also IV infiltrated when saline flush was running Watch closely for now for any evidence of bleed/compartment synd Keep NPO overnight  -- Amie [place], MD [facility] Pager: [phone] If 7pm to 7am, please call on call as listed on AMION.

## 2020-08-20 NOTE — Progress Notes (Signed)
STROKE TEAM PROGRESS NOTE    Interval History   She presented with sudden onset of right-sided weakness which was resolved after a little while and then had sudden onset of left-sided weakness and neglect.  NIH stroke scale was 9.  She was given IV TPA and has done well.  Blood pressure adequately controlled.  Neurological exam stable MRI shows no acute infarct.  Pertinent Lab Work and Imaging    CT Head WO IV Contrast 1. No evidence of acute intracranial abnormality.  ASPECTS is 10. 2. Chronic microvascular ischemic disease and generalized atrophy.  CT Angio Head and Neck W WO IV Contrast Negative for emergent large vessel occlusion.  Atheromatous changes at both carotid bifurcations with 75% right ICA and 50% left ICA stenosis stable. CT perfusion negative MRI Brain WO IV Contrast 1. No acute intracranial abnormality. 2. Diffuse, advanced atrophy and chronic ischemic microangiopathy. 3. Bilateral mastoid effusions.  Echocardiogram Complete 08/14/2020 1. Left ventricular ejection fraction, by estimation, is 60 to 65%. The  left ventricle has normal function. The left ventricle has no regional  wall motion abnormalities. There is mild asymmetric left ventricular  hypertrophy of the basal-septal segment.  Indeterminate diastolic filling due to E-A fusion.  2. Right ventricular systolic function is normal. The right ventricular  size is normal. There is normal pulmonary artery systolic pressure.  3. The mitral valve is normal in structure. Mild mitral valve  regurgitation. No evidence of mitral stenosis.  4. The aortic valve is grossly normal. There is moderate calcification of  the aortic valve. Aortic valve regurgitation is mild. Mild aortic valve  sclerosis is present, with no evidence of aortic valve stenosis.  5. The inferior vena cava is normal in size with greater than 50%  respiratory variability, suggesting right atrial pressure of 3 mmHg.   No intracardiac source of  embolism detected on this transthoracic study. A transesophageal echocardiogram is  recommended to exclude cardiac source of embolism if clinically indicated.  Physical Examination   Constitutional: Calm, appropriate for condition  Cardiovascular: Normal RR Respiratory: No increased WOB   Mental status: Oriented to self only, follows commands  Speech: Fluent with repetition and naming intact, no dysarthria noted Cranial nerves: EOMI, VFF, Face symmetric, Tongue midline, Shoulder shrug intact  Motor: Normal bulk and tone. No drift.   Dlt Bic Tri FgS Grp HF  KnF KnE PIF DoF  R 5 5 5 5 5 5 5 5 5 5   L 5 5 5 5 5 5 5 5 5 5   Sensory: Intact to light tough throughout  Coordination: Intact FNF  Reflexes: Not assessed Gait: Deferred  NIHSS: 0   Assessment and Plan   Shawna Lynch is a 85 y.o. female w/pmh of DM, CAD s/p DES, HTN and prior TIA who presents with left-sided weakness and neglect with NIH stroke scale of 9 with concern for right hemispheric infarct and was treated with IV TPA with resolution of her symptoms #Transient left sided weakness, and neglect suspect right brain TIA treated with IV TPA with resolution of symptoms.  #Hypertension #CAD  #History of NSTEMI 2018  She has a history of hypertension and CAD- at home she takes Coreg 25 mg BID + Imdur 60 mg 24 hour tablet. Given she did not have an acute stroke there is no need for permissive HTN and her blood pressure medications can be resumed. Long term blood pressure goal is < 140/90.  #Hyperlipidemia From a stroke prevention stand point, the LDL  goal is < 70. Her LDL is 23 and at goal- continue Atorvastatin 80 mg   #Diabetes type II Uncontrolled She has diabetes with Hemoglobin A1C this admission noted to be 8.9. Takes Metformin 500 QD. From a stroke stand point A1C should be < 7. Diabetes should be managed by her PCP on an outpatient basis.  Recommend close neurological monitoring and tight blood pressure control as  per post TPA protocol.  Mobilize out of bed.  Therapy consults.  Swallow eval.  Aspirin if no post TPA bleed. This patient is critically ill and at significant risk of neurological worsening, death and care requires constant monitoring of vital signs, hemodynamics,respiratory and cardiac monitoring, extensive review of multiple databases, frequent neurological assessment, discussion with family, other specialists and medical decision making of high complexity.I have made any additions or clarifications directly to the above note.This critical care time does not reflect procedure time, or teaching time or supervisory time of PA/NP/Med Resident etc but could involve care discussion time.  I spent 30 minutes of neurocritical care time  in the care of  this patient.    Antony Contras, MD Stroke Neurology 08/20/2020 5:36 PM      To contact Stroke Continuity provider, please refer to [email or web address] After hours, contact General Neurology

## 2020-08-20 NOTE — Progress Notes (Signed)
SLP Cancellation Note  Patient Details Name: Shawna Lynch MRN: [id number] DOB: Jun 25, 1935   Cancelled treatment:        Attempted to see pt for swallowing and speech language evaluation.  Pt off floor at time of attempt.  SLP will reattempt as schedule permits.  Jullianna Gabor E Cian Costanzo 08/20/2020, 10:19 AM

## 2020-08-20 NOTE — Progress Notes (Signed)
Physical Therapy Treatment Patient Details Name: Shawna Lynch MRN: [id number] DOB: May 11, 1935 Today's Date: 08/20/2020    History of Present Illness The pt is an 85 yo female presenting from home with L-sided weakness, righward gaze, and L-sided neglect. The pt received tPA upon arrival, but imaging received after arrivalindicated no acute abnormality. PMH includes: dementia, CAD, DM II, HTN, NSTEMI, and multiple TIAs.     PT Comments    Pt in bed upon arrival of PT, agreeable to evaluation at this time. Prior to admission the pt was independent with mobility in the home without use of AD, but did rely on assist from aide (comes 8-5pm daily) for assist with ADLs and IADLs. The pt now presents with limitations in functional mobility, strength, power, stability, and safety awareness due to above dx, and will continue to benefit from skilled PT to address these deficits. The pt was able to complete initial transfers and short bouts of ambulation in the room with min/modA of 1-2 people for assist to power up, steady, and with management of RW due to poor safety awareness and limited ability to be redirected. The pt will continue to benefit from skilled PT to progress functional strength and stability for transfers as well as improved safety with mobility prior to return home where she has good support from aides and family.      Follow Up Recommendations  SNF;Supervision/Assistance - 24 hour     Equipment Recommendations  None recommended by PT    Recommendations for Other Services       Precautions / Restrictions Precautions Precautions: Fall Precaution Comments: dementia at baseline Restrictions Weight Bearing Restrictions: No    Mobility  Bed Mobility Overal bed mobility: Needs Assistance Bed Mobility: Supine to Sit     Supine to sit: Min assist     General bed mobility comments: minA with significant cues to come to sitting EOB. minA to BLE and minA to rasie trunk from  EOB  Transfers Overall transfer level: Needs assistance Equipment used: Rolling walker (2 wheeled) Transfers: Sit to/from Stand Sit to Stand: Mod assist         General transfer comment: initially modA of 2, progressed to minA/modA of 1 with reps through session. strong posterior lean/legs on bed. slow to rise  Ambulation/Gait Ambulation/Gait assistance: Min assist Gait Distance (Feet): 15 Feet (x2) Assistive device: Rolling walker (2 wheeled) Gait Pattern/deviations: Step-through pattern;Decreased stride length;Trunk flexed Gait velocity: Decreased Gait velocity interpretation: <1.31 ft/sec, indicative of household ambulator General Gait Details: initial slow, short steps requriring minA to steady and assist with RW management. with fatigue pt pushing RW too far in front with limited control despite cues to slow/improve posture.        Balance Overall balance assessment: Needs assistance Sitting-balance support: No upper extremity supported;Feet supported Sitting balance-Leahy Scale: Fair     Standing balance support: Bilateral upper extremity supported Standing balance-Leahy Scale: Poor Standing balance comment: reliant on BUE support and minA to maintain balance                            Cognition Arousal/Alertness: Awake/alert Behavior During Therapy: WFL for tasks assessed/performed Overall Cognitive Status: History of cognitive impairments - at baseline Area of Impairment: Orientation;Memory;Following commands;Safety/judgement;Problem solving                 Orientation Level: Disoriented to;Place;Time;Situation   Memory: Decreased short-term memory Following Commands: Follows one step commands with increased  time Safety/Judgement: Decreased awareness of safety;Decreased awareness of deficits   Problem Solving: Slow processing;Requires verbal cues General Comments: dementia at baseline, son states she seems a little more "foggy" than normal,  pt with almost impulsive behaviors at times while agreeable at other times through session         General Comments General comments (skin integrity, edema, etc.): VSS on RA, son present and supportive      Pertinent Vitals/Pain Pain Assessment: Faces Faces Pain Scale: Hurts a little bit Pain Location: generalized stiffness Pain Descriptors / Indicators: Discomfort Pain Intervention(s): Limited activity within patient's tolerance;Monitored during session;Repositioned    Home Living Family/patient expects to be discharged to:: Private residence Living Arrangements: Alone Available Help at Discharge: Personal care attendant;Family;Available PRN/intermittently Type of Home: House Home Access: Level entry   Home Layout: One level Home Equipment: Walker - 2 wheels;Cane - single point;Shower seat;Grab bars - tub/shower Additional Comments: pt son present to verify information, states pt has aide during the day (8-5) and family can visit in morning, evening, and overnight if needed    Prior Function Level of Independence: Needs assistance  Gait / Transfers Assistance Needed: Independent with ambulation ADL's / Homemaking Assistance Needed: Aide assists with bathing and IADLs.      PT Goals (current goals can now be found in the care plan section) Acute Rehab PT Goals Patient Stated Goal: to go home PT Goal Formulation: With patient Time For Goal Achievement: 09/03/20 Potential to Achieve Goals: Good    Frequency    Min 3X/week      PT Plan      Co-evaluation PT/OT/SLP Co-Evaluation/Treatment: Yes Reason for Co-Treatment: Necessary to address cognition/behavior during functional activity;For patient/therapist safety;To address functional/ADL transfers PT goals addressed during session: Mobility/safety with mobility;Balance;Proper use of DME        AM-PAC PT "6 Clicks" Mobility   Outcome Measure  Help needed turning from your back to your side while in a flat bed  without using bedrails?: A Little Help needed moving from lying on your back to sitting on the side of a flat bed without using bedrails?: A Little Help needed moving to and from a bed to a chair (including a wheelchair)?: A Lot Help needed standing up from a chair using your arms (e.g., wheelchair or bedside chair)?: A Little Help needed to walk in hospital room?: A Lot Help needed climbing 3-5 steps with a railing? : A Lot 6 Click Score: 15    End of Session Equipment Utilized During Treatment: Gait belt Activity Tolerance: Patient tolerated treatment well Patient left: in chair;with call bell/phone within reach;with chair alarm set;with family/visitor present Nurse Communication: Mobility status PT Visit Diagnosis: Unsteadiness on feet (R26.81);Muscle weakness (generalized) (M62.81)     Time: 1411-1450 PT Time Calculation (min) (ACUTE ONLY): 39 min  Charges:  $Gait Training: 8-22 mins                     Karma Ganja, PT, DPT   Acute Rehabilitation Department Pager #: [phone]   Otho Bellows 08/20/2020, 5:22 PM

## 2020-08-20 NOTE — Progress Notes (Signed)
Pt's son, Shanon Brow, requested [facility] in [place] if she needs skilled care.

## 2020-08-21 ENCOUNTER — Encounter (HOSPITAL_COMMUNITY): Payer: Self-pay | Admitting: Student in an Organized Health Care Education/Training Program

## 2020-08-21 ENCOUNTER — Inpatient Hospital Stay (HOSPITAL_COMMUNITY): Payer: Medicare HMO

## 2020-08-21 LAB — GLUCOSE, CAPILLARY
Glucose-Capillary: 229 mg/dL — ABNORMAL HIGH (ref 70–99)
Glucose-Capillary: 164 mg/dL — ABNORMAL HIGH (ref 70–99)
Glucose-Capillary: 250 mg/dL — ABNORMAL HIGH (ref 70–99)

## 2020-08-21 MED ORDER — VITAMIN D 25 MCG (1000 UNIT) PO TABS
1000.0000 [IU] | ORAL_TABLET | Freq: Every day | ORAL | Status: DC
  Administered 2020-08-21 – 2020-08-24 (×4): 1000 [IU] via ORAL
  Filled 2020-08-21 (×4): qty 1

## 2020-08-21 MED ORDER — ESCITALOPRAM OXALATE 10 MG PO TABS
10.0000 mg | ORAL_TABLET | Freq: Every day | ORAL | Status: DC
  Administered 2020-08-21 – 2020-08-24 (×4): 10 mg via ORAL
  Filled 2020-08-21 (×4): qty 1

## 2020-08-21 MED ORDER — ENOXAPARIN SODIUM 40 MG/0.4ML ~~LOC~~ SOLN
40.0000 mg | SUBCUTANEOUS | Status: DC
  Administered 2020-08-21 – 2020-08-23 (×3): 40 mg via SUBCUTANEOUS
  Filled 2020-08-21 (×3): qty 0.4

## 2020-08-21 MED ORDER — INSULIN ASPART 100 UNIT/ML ~~LOC~~ SOLN
0.0000 [IU] | Freq: Every day | SUBCUTANEOUS | Status: DC
  Administered 2020-08-21: 3 [IU] via SUBCUTANEOUS

## 2020-08-21 MED ORDER — CARVEDILOL 12.5 MG PO TABS
12.5000 mg | ORAL_TABLET | Freq: Two times a day (BID) | ORAL | Status: DC
  Administered 2020-08-21 – 2020-08-24 (×6): 12.5 mg via ORAL
  Filled 2020-08-21 (×7): qty 1

## 2020-08-21 MED ORDER — ATORVASTATIN CALCIUM 80 MG PO TABS: 80.0000 mg | ORAL_TABLET | Freq: Every day | ORAL | Status: DC

## 2020-08-21 MED ORDER — ATORVASTATIN CALCIUM 40 MG PO TABS
40.0000 mg | ORAL_TABLET | Freq: Every day | ORAL | Status: DC
  Administered 2020-08-21 – 2020-08-23 (×3): 40 mg via ORAL
  Filled 2020-08-21 (×3): qty 1

## 2020-08-21 MED ORDER — OMEGA-3-ACID ETHYL ESTERS 1 G PO CAPS
1.0000 g | ORAL_CAPSULE | Freq: Two times a day (BID) | ORAL | Status: DC
  Administered 2020-08-21 – 2020-08-24 (×7): 1 g via ORAL
  Filled 2020-08-21 (×7): qty 1

## 2020-08-21 MED ORDER — INSULIN ASPART 100 UNIT/ML ~~LOC~~ SOLN
0.0000 [IU] | Freq: Three times a day (TID) | SUBCUTANEOUS | Status: DC
  Administered 2020-08-21: 3 [IU] via SUBCUTANEOUS
  Administered 2020-08-21: 2 [IU] via SUBCUTANEOUS
  Administered 2020-08-22: 7 [IU] via SUBCUTANEOUS
  Administered 2020-08-22 – 2020-08-23 (×3): 3 [IU] via SUBCUTANEOUS
  Administered 2020-08-23: 7 [IU] via SUBCUTANEOUS
  Administered 2020-08-23: 2 [IU] via SUBCUTANEOUS
  Administered 2020-08-24: 7 [IU] via SUBCUTANEOUS
  Administered 2020-08-24: 2 [IU] via SUBCUTANEOUS

## 2020-08-21 MED ORDER — ASPIRIN EC 81 MG PO TBEC
81.0000 mg | DELAYED_RELEASE_TABLET | Freq: Every day | ORAL | Status: DC
  Administered 2020-08-21 – 2020-08-24 (×4): 81 mg via ORAL
  Filled 2020-08-21 (×4): qty 1

## 2020-08-21 MED ORDER — TICAGRELOR 90 MG PO TABS
90.0000 mg | ORAL_TABLET | Freq: Two times a day (BID) | ORAL | Status: DC
  Administered 2020-08-21 – 2020-08-24 (×7): 90 mg via ORAL
  Filled 2020-08-21 (×7): qty 1

## 2020-08-21 NOTE — NC FL2 (Addendum)
[place] MEDICAID FL2 LEVEL OF CARE SCREENING TOOL     IDENTIFICATION  Patient Name: Shawna Lynch Birthdate: [birth year] Sex: female Admission Date (Current Location): 08/19/2020  [facility] and [place] Number:  Herbalist and Address:  [facility], [place] [address], [place], [place] [address]      Provider Number: [id number]  Attending Physician Name and Address:  Garvin Fila, MD  Relative Name and Phone Number:  Fenton Malling [phone]    Current Level of Care: Hospital Recommended Level of Care: [place] Prior Approval Number:    Date Approved/Denied:   PASRR Number: [id number]  Discharge Plan: SNF    Current Diagnoses: Patient Active Problem List   Diagnosis Date Noted   Acute ischemic stroke ([place]) 08/19/2020   TIA (transient ischemic attack) 08/13/2020   Mild cognitive impairment 03/09/2018   Pruritus 03/09/2018   CAD S/P percutaneous coronary angioplasty 04/27/2017   Dementia ([place]) 04/27/2017   Dyslipidemia 04/07/2017   Non-insulin treated type 2 diabetes mellitus ([place]) 04/06/2017   Essential hypertension 04/06/2017   History of non-ST elevation myocardial infarction (NSTEMI) 04/03/2017    Orientation RESPIRATION BLADDER Height & Weight     Self  Normal Incontinent, External catheter Weight: 63.8 kg Height:     BEHAVIORAL SYMPTOMS/MOOD NEUROLOGICAL BOWEL NUTRITION STATUS      Incontinent Diet (Regular thin liquids)  AMBULATORY STATUS COMMUNICATION OF NEEDS Skin   Supervision Verbally Normal                       Personal Care Assistance Level of Assistance  Bathing, Feeding, Dressing Bathing Assistance: Limited assistance Feeding assistance: Independent Dressing Assistance: Limited assistance     Functional Limitations Info             [place]  PT (By licensed PT), OT (By licensed OT)     PT Frequency: 5 times weekly OT Frequency: 5 times weekly             Contractures Contractures Info: Not present    Additional Factors Info  Code Status, Insulin Sliding Scale, Allergies Code Status Info: DNR Allergies Info: Penicillins-hives; Sulfa-rash; Vicodin-N/V; Codeine-rash   Insulin Sliding Scale Info: Novolog 0-9 units TID daily with meals, and 0-5 units at bedtime       Current Medications (08/21/2020):  This is the current hospital active medication list Current Facility-Administered Medications  Medication Dose Route Frequency Provider Last Rate Last Admin    stroke: mapping our early stages of recovery book   Does not apply Once Amie Portland, MD       0.9 %  sodium chloride infusion   Intravenous Continuous Donzetta Starch, NP 50 mL/hr at 08/21/20 1600 Rate Verify at 08/21/20 1600   acetaminophen (TYLENOL) tablet 650 mg  650 mg Oral Q4H PRN Amie Portland, MD   650 mg at 08/21/20 0854   Or   acetaminophen (TYLENOL) 160 MG/5ML solution 650 mg  650 mg Per Tube Q4H PRN Amie Portland, MD       Or   acetaminophen (TYLENOL) suppository 650 mg  650 mg Rectal Q4H PRN Amie Portland, MD       aspirin EC tablet 81 mg  81 mg Oral Daily Burnetta Sabin L, NP   81 mg at 08/21/20 1039   atorvastatin (LIPITOR) tablet 40 mg  40 mg Oral q1800 Burnetta Sabin L, NP       carvedilol (COREG) tablet 12.5  mg  12.5 mg Oral BID WC Burnetta Sabin L, NP       chlorhexidine (PERIDEX) 0.12 % solution 15 mL  15 mL Mouth Rinse BID Amie Portland, MD   15 mL at 08/21/20 2957   Chlorhexidine Gluconate Cloth 2 % PADS 6 each  6 each Topical Daily Amie Portland, MD   6 each at 08/21/20 1042   cholecalciferol (VITAMIN D3) tablet 1,000 Units  1,000 Units Oral Daily Donzetta Starch, NP   1,000 Units at 08/21/20 1039   enoxaparin (LOVENOX) injection 40 mg  40 mg Subcutaneous Q24H Burnetta Sabin L, NP   40 mg at 08/21/20 1535   escitalopram (LEXAPRO) tablet 10 mg  10 mg Oral Daily Burnetta Sabin L, NP   10 mg at 08/21/20 1039   insulin aspart (novoLOG) injection 0-5 Units  0-5 Units  Subcutaneous QHS Burnetta Sabin L, NP       insulin aspart (novoLOG) injection 0-9 Units  0-9 Units Subcutaneous TID WC Donzetta Starch, NP   3 Units at 08/21/20 1310   omega-3 acid ethyl esters (LOVAZA) capsule 1 g  1 g Oral BID Burnetta Sabin L, NP   1 g at 08/21/20 1039   ondansetron (ZOFRAN) injection 4 mg  4 mg Intravenous Q6H PRN Amie Portland, MD   4 mg at 08/20/20 0152   pantoprazole (PROTONIX) EC tablet 40 mg  40 mg Oral QHS Amie Portland, MD   40 mg at 08/20/20 2139   senna-docusate (Senokot-S) tablet 1 tablet  1 tablet Oral QHS PRN Amie Portland, MD       ticagrelor [facility]) tablet 90 mg  90 mg Oral BID Donzetta Starch, NP   90 mg at 08/21/20 1039     Discharge Medications: Please see discharge summary for a list of discharge medications.  Relevant Imaging Results:  Relevant Lab Results:   Additional Information SS # [id number]     Reinaldo Raddle, RN, BSN  Trauma/Neuro ICU Case Manager [phone] I have personally obtained history,examined this patient, reviewed notes, independently viewed imaging studies, participated in medical decision making and plan of care.ROS completed by me personally and pertinent positives fully documented  I have made any additions or clarifications directly to the above note. Agree with note above.    Antony Contras, MD Medical Director [facility] Stroke Center Pager: [phone] 08/22/2020 8:07 AM

## 2020-08-21 NOTE — Progress Notes (Signed)
Physical Therapy Treatment Patient Details Name: Shawna Lynch MRN: [id number] DOB: 1935-07-10 Today's Date: 08/21/2020    History of Present Illness The pt is an 85 yo female presenting from home with L-sided weakness, righward gaze, and L-sided neglect. The pt received tPA upon arrival, but imaging received after arrivalindicated no acute abnormality. PMH includes: dementia, CAD, DM II, HTN, NSTEMI, and multiple TIAs.     PT Comments    Pt progressing well with mobility, ambulating hallway distance initially with use of RW transitioning to no AD as RW appeared to confuse pt. Pt requiring min-mod assist for mobility tasks overall, and remains impulsive with mobility. LOB x1, requiring PT assist and seated recovery. PT to continue to recommend SNF level of care post-acutely.     Follow Up Recommendations  SNF;Supervision/Assistance - 24 hour     Equipment Recommendations  None recommended by PT    Recommendations for Other Services       Precautions / Restrictions Precautions Precautions: Fall Precaution Comments: dementia at baseline    Mobility  Bed Mobility Overal bed mobility: Needs Assistance Bed Mobility: Supine to Sit     Supine to sit: Min guard;HOB elevated     General bed mobility comments: for safety, increased time to scoot to EOB.  Transfers Overall transfer level: Needs assistance Equipment used: Rolling walker (2 wheeled) Transfers: Sit to/from Stand Sit to Stand: Min assist;From elevated surface         General transfer comment: Min assist for power up, steadying upon standing. Verbal cuing for hand placement when rising. STS x2, from EOB and chair in hallway  Ambulation/Gait Ambulation/Gait assistance: Min assist Gait Distance (Feet): 75 Feet (+30) Assistive device: Rolling walker (2 wheeled);None Gait Pattern/deviations: Step-through pattern;Decreased stride length;Trunk flexed;Drifts right/left Gait velocity: decr   General Gait Details:  Min assist to steady, LOB x1 requiring PT assist to recover. Initially with use of RW, transitioning to no AD given pt's poor form and safety with RW use. Verbal cuing for upright posture, seated rest breaks as needed. Rest break x1, pt reporting feeling weak.   Stairs             Wheelchair Mobility    Modified Rankin (Stroke Patients Only)       Balance Overall balance assessment: Needs assistance Sitting-balance support: No upper extremity supported;Feet supported Sitting balance-Leahy Scale: Fair     Standing balance support: No upper extremity supported;During functional activity Standing balance-Leahy Scale: Fair                              Cognition Arousal/Alertness: Awake/alert Behavior During Therapy: WFL for tasks assessed/performed Overall Cognitive Status: History of cognitive impairments - at baseline Area of Impairment: Orientation;Memory;Following commands;Safety/judgement;Problem solving                 Orientation Level: Disoriented to;Place;Time;Situation   Memory: Decreased short-term memory Following Commands: Follows one step commands with increased time Safety/Judgement: Decreased awareness of safety;Decreased awareness of deficits   Problem Solving: Slow processing;Requires verbal cues General Comments: dementia at baseline, states her son is her husband x2, 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 Impulsive at times, requires max verbal cuing to wait until PT is ready to assist      Exercises      General Comments General comments (skin integrity, edema, etc.): vss, son present during session      Pertinent Vitals/Pain Pain Assessment: No/denies pain Faces  Pain Scale: No hurt Pain Intervention(s): Monitored during session    Home Living       Type of Home: House              Prior Function            PT Goals (current goals can now be found in the care plan section) Acute Rehab PT  Goals Patient Stated Goal: to go home PT Goal Formulation: With patient Time For Goal Achievement: 09/03/20 Potential to Achieve Goals: Good Progress towards PT goals: Progressing toward goals    Frequency    Min 3X/week      PT Plan Current plan remains appropriate    Co-evaluation              AM-PAC PT "6 Clicks" Mobility   Outcome Measure  Help needed turning from your back to your side while in a flat bed without using bedrails?: A Little Help needed moving from lying on your back to sitting on the side of a flat bed without using bedrails?: A Little Help needed moving to and from a bed to a chair (including a wheelchair)?: A Little Help needed standing up from a chair using your arms (e.g., wheelchair or bedside chair)?: A Little Help needed to walk in hospital room?: A Lot Help needed climbing 3-5 steps with a railing? : A Lot 6 Click Score: 16    End of Session Equipment Utilized During Treatment: Gait belt Activity Tolerance: Patient tolerated treatment well Patient left: in chair;with call bell/phone within reach;with chair alarm set;with family/visitor present Nurse Communication: Mobility status PT Visit Diagnosis: Unsteadiness on feet (R26.81);Muscle weakness (generalized) (M62.81)     Time: 8466-5993 PT Time Calculation (min) (ACUTE ONLY): 22 min  Charges:  $Gait Training: 8-22 mins                     Alphonzo Devera E, PT Acute Rehabilitation Services Pager [phone]  Office [phone]   Alauna Hayden D Elonda Husky 08/21/2020, 1:29 PM

## 2020-08-21 NOTE — Progress Notes (Signed)
STROKE TEAM PROGRESS NOTE   INTERVAL HISTORY Patient is lying comfortably in bed.  She still has some left-sided neglect and sensory and visual inattention but left-sided weakness has improved.  Blood pressure adequately controlled though she was briefly on Cardene drip.  Therapy evaluation recommends rehabilitation in the skilled nursing setting.  Speech therapy has cleared her for regular thin liquid diet.  Patient's son is at the bedside.  MRI scan of the brain did not show an acute stroke.  CT scan of the head was repeated today in view of persistent symptoms but shows no acute abnormality.  EEG done on admission showed no evidence of seizure activity with intermittent generalized slowing and left temporal slowing.  Vitals:   08/21/20 0500 08/21/20 0600 08/21/20 0700 08/21/20 0807  BP: (!) 166/102 (!) 161/111 (!) 163/88 (!) 141/78  Pulse: 98 99 98 96  Resp: 18 16 18 19   Temp:    98 F (36.7 C)  TempSrc:    Oral  SpO2: 96% 96% 96% 95%  Weight:       CBC:  Recent Labs  Lab 08/19/20 2257 08/19/20 2308  WBC 8.8  --   NEUTROABS 6.1  --   HGB 11.8* 11.9*  HCT 36.7 35.0*  MCV 92.0  --   PLT 190  --    Basic Metabolic Panel:  Recent Labs  Lab 08/19/20 2257 08/19/20 2308  NA 133* 134*  K 3.7 3.6  CL 96* 96*  CO2 24  --   GLUCOSE 244* 237*  BUN 21 23  CREATININE 1.37* 1.30*  CALCIUM 9.7  --    Lab Results  Component Value Date   CHOL 109 08/13/2020   HDL 44 08/13/2020   LDLCALC 23 08/13/2020   TRIG 211 (H) 08/13/2020   CHOLHDL 2.5 08/13/2020   Lab Results  Component Value Date   HGBA1C 8.9 (H) 08/13/2020    Urine Drug Screen: No results for input(s): LABOPIA, COCAINSCRNUR, LABBENZ, AMPHETMU, THCU, LABBARB in the last 168 hours.  Alcohol Level No results for input(s): ETH in the last 168 hours.  IMAGING past 24 hours MR BRAIN WO CONTRAST  Result Date: 08/20/2020 CLINICAL DATA:  Stroke, follow-up. EXAM: MRI HEAD WITHOUT CONTRAST TECHNIQUE: Multiplanar, multiecho  pulse sequences of the brain and surrounding structures were obtained without intravenous contrast. COMPARISON:  Head CT August 19, 2020. MRI of the brain August 14, 2020. FINDINGS: Brain: No acute infarction, hemorrhage, hydrocephalus, extra-axial collection or mass lesion. Scattered and confluent foci of T2 hyperintensity are seen within the white matter of the cerebral hemispheres and within the pons, nonspecific, unchanged from prior MRI. Moderate parenchymal volume loss. Vascular: Normal flow voids. Skull and upper cervical spine: Normal marrow signal. Sinuses/Orbits: Bilateral lens surgery. Paranasal sinuses are clear. Other: Small bilateral mastoid effusion. IMPRESSION: 1. No acute intracranial abnormality. 2. Stable moderate parenchymal volume loss and chronic white matter disease. 3. Small bilateral mastoid effusion. Electronically Signed   By: Pedro Earls M.D.   On: 08/20/2020 11:04    PHYSICAL EXAM    Constitutional: Calm, appropriate for condition  Cardiovascular: Normal RR Respiratory: No increased WOB   Mental status: Oriented to self only, follows commands  Speech: Fluent with repetition and naming intact, no dysarthria noted Cranial nerves: EOMI, VFF, but sensory inattention on the left.  Face symmetric, Tongue midline, Shoulder shrug intact  Motor: Normal bulk and tone. No drift.   Dlt Bic Tri FgS Grp HF  KnF KnE PIF DoF  R  5 5 5 5 5 5 5 5 5 5   L 5 5 5 5 5 5 5 5 5 5   Sensory: Intact to light tough throughout sensory inattention on the left. Coordination: Intact FNF  Reflexes: Not assessed Gait: Deferred  NIHSS: 2   ASSESSMENT/PLAN Shawna Lynch is a 85 y.o. female with history of  DM, CAD s/p DES, HTN and prior TIA presenting with left-sided weakness and neglect with NIH stroke scale of 9 with concern for right hemispheric infarct and was treated with IV TPA with resolution of her symptoms.   Stroke like symptoms s/p tPA, MRI negative but  symptoms remain, therefore dx R brain Stroke not seen on imaging  Code Stroke CT head No acute abnormality. Small vessel disease. Atrophy. ASPECTS 10.     CTA head & neck no LVO, B ICA bifurcation R 75%, L 50%  CT perfusion negative  MRI  No acute abnormality. Small vessel disease. Atrophy.   Repeat CT head neg for stroke    2D Echo EF 60-65%. No source of embolus   LDL 23  HgbA1c 8.9  VTE prophylaxis - SCDs -> Lovenox 40 mg sq daily    aspirin 81 mg daily and Brilinta (ticagrelor) 90 mg bid prior to admission, now on No antithrombotic. Resume DAPT w/ brilinta and continue at d/c  Therapy recommendations:  SNF  Disposition:  pending - lives at home with daytime sitter, family considering hs sitter once she returns home from SNF - they prefer [facility] and have contacted them  Transfer to the floor  R ICA stenosis   Possibly symptomatic, possible etiology of stroke  R ICA 75%  Not a surgical candidate given advanced dementia  Hypertension  Home meds:  Coreg 25 mg BID + Imdur 60 mg   Stable . Long-term BP goal normotensive  Hyperlipidemia  Home meds:  lipitor 80 + omega 3  LDL 23, goal < 70  Resume lipitor at half dose given LDL  Continue statin at discharge  Diabetes type II Uncontrolled  Home meds:  Metformin 500 QD  HgbA1c 8.9, goal < 7.0  Add CBGs Recent Labs    08/19/20 2152  GLUCAP 110*      Add SSI  Other Stroke Risk Factors  Advanced Age >/= 1   Former Cigarette smoker  Coronary artery disease, NSTEMI 2018  Hx TIA  Other Active Problems  Baseline dementia requiring daytime sitter PTA  IVF to 50cc/hr. Check labs.  Hospital day # 2 Patient continues to have persistent symptoms of left-sided inattention both visually and sensation wise with MRI scan as well as follow-up CT scan showed no evidence of new right brain stroke.  This likely is a strokelike episode doubt this is seizure.  Recommend resume home antiplatelet therapy of  aspirin and Brilinta.  Mobilize out of bed.  Continue ongoing therapies.  Transfer to neurology floor bed.  Likely transfer to skilled nursing facility for rehab in the next few days after insurance approval and bed availability.  Long discussion patient and son and answered questions.  Greater than 50% time during this 35-minute visit was spent on counseling and coordination of care about her strokelike episode discussion about rehabilitation and disposition plan and answering questions. Antony Contras, MD  To contact Stroke Continuity provider, please refer to [email or web address] After hours, contact General Neurology

## 2020-08-21 NOTE — Evaluation (Signed)
Speech Language Pathology Evaluation Patient Details Name: Shawna Lynch MRN: [id number] DOB: Jan 10, 1935 Today's Date: 08/21/2020 Time: 1059-1110 SLP Time Calculation (min) (ACUTE ONLY): 11 min  Problem List:  Patient Active Problem List   Diagnosis Date Noted  . Acute ischemic stroke ([place]) 08/19/2020  . TIA (transient ischemic attack) 08/13/2020  . Mild cognitive impairment 03/09/2018  . Pruritus 03/09/2018  . CAD S/P percutaneous coronary angioplasty 04/27/2017  . Dementia ([place]) 04/27/2017  . Dyslipidemia 04/07/2017  . Non-insulin treated type 2 diabetes mellitus ([place]) 04/06/2017  . Essential hypertension 04/06/2017  . History of non-ST elevation myocardial infarction (NSTEMI) 04/03/2017   Past Medical History:  Past Medical History:  Diagnosis Date  . Anxiety   . Arthritis    oa  . Coronary artery disease    04/06/17 PCI with DES to [facility], 90% in diag from LAD, nonobstructive disease in LAD, RCA. Normal EF.   [name] Dementia ([place])   . Depression   . Diabetes mellitus without complication ([place])   . Hypertension   . NSTEMI (non-ST elevated myocardial infarction) ([place])   . Ringing in ears   . TIA (transient ischemic attack)    Past Surgical History:  Past Surgical History:  Procedure Laterality Date  . ABDOMINAL HYSTERECTOMY    . CORONARY STENT INTERVENTION  04/06/2017  . CORONARY STENT INTERVENTION N/A 04/06/2017   Procedure: Coronary Stent Intervention;  Surgeon: Sherren Mocha, MD;  Location: [place] [facility];  Service: Cardiovascular;  Laterality: N/A;  . LEFT HEART CATH AND CORONARY ANGIOGRAPHY N/A 04/06/2017   Procedure: Left Heart Cath and Coronary Angiography;  Surgeon: Sherren Mocha, MD;  Location: [place] [facility];  Service: Cardiovascular;  Laterality: N/A;   HPI:  Shawna Lynch is a 85 y.o. female past medical history of dementia, coronary artery disease, diabetes, hypertension, presented to the emergency room for evaluation of sudden onset of right-sided  weakness which was resolved after a little while and then had sudden onset of left-sided weakness and neglect.The patient was recently seen for a left hemispheric TIA when she had presented on 08/14/2020 with right facial droop and right-sided weakness transient symptoms.  CXR 11/21: "Findings concerning for volume overload with developing pulmonaryedema. Superimposed atypical infectious process is not excluded inthe appropriate clinical setting."  MRI 11/22 with no acute findings   Assessment / Plan / Recommendation Clinical Impression  Pt presents with appropriate speech and language, but with cognitive impairments that per chart, are an acute change from her baseline. She follows simple commands well and is 70% accurate with mildly complex yes/no questions. PTA she was living alone with daily assist from aids and family, but she is a limited historian and cannot tell me much about this assistance. Pt has difficulty with storage and immediate recall, intellectual awareness, and simple verbal problem solving. She also cannot tell me her name (only her maiden name) or her birthday. Given acute changes from baseline, recommend SLP f/u at SNF with 24/7 supervision for safety.     SLP Assessment  SLP Recommendation/Assessment: Patient needs continued Speech Lanaguage Pathology Services SLP Visit Diagnosis: Cognitive communication deficit (R41.841)    Follow Up Recommendations  Skilled Nursing facility    Frequency and Duration min 2x/week  2 weeks      SLP Evaluation Cognition  Overall Cognitive Status: Impaired/Different from baseline Arousal/Alertness: Awake/alert Orientation Level: Disoriented to person;Oriented to place;Disoriented to time Attention: Sustained Sustained Attention: Impaired Sustained Attention Impairment: Verbal basic Memory: Impaired Memory Impairment: Storage deficit;Retrieval deficit;Decreased recall  of new information Awareness: Impaired Awareness Impairment:  Intellectual impairment Problem Solving: Impaired Problem Solving Impairment: Verbal basic       Comprehension  Auditory Comprehension Overall Auditory Comprehension: Impaired Yes/No Questions: Impaired Complex Questions: 50-74% accurate (70%) Commands: Within Functional Limits    Expression Expression Primary Mode of Expression: Verbal Verbal Expression Overall Verbal Expression: Appears within functional limits for tasks assessed   Oral / Motor  Motor Speech Overall Motor Speech: Appears within functional limits for tasks assessed   GO                    Osie Bond., M.A. [place] [facility] Pager [phone] Office [phone]  08/21/2020, 11:22 AM

## 2020-08-21 NOTE — TOC Initial Note (Signed)
Transition of Care [facility]) - Initial/Assessment Note    Patient Details  Name: Shawna Lynch MRN: [id number] Date of Birth: 1935/01/04  Transition of Care [facility]) CM/SW Contact:    Ella Bodo, RN Phone Number: 08/21/2020, 4:33 PM  Clinical Narrative:  The pt is an 85 yo female presenting from home with L-sided weakness, righward gaze, and L-sided neglect. The pt received tPA upon arrival, but imaging received after arrival indicated no acute abnormality.  Prior to admission patient required assistance with ADLs; she has a private duty aide during the day 8 AM to 5 PM, and family visiting intermittently.  PT/OT recommending skilled nursing facility placement for short-term rehab.  Spoke with patient's daughter, Shawna Lynch, who confirms need for SNF placement; she states that she has already spoken with [facility], and they report they may have a bed at the end of the week.  Will initiate FL- 2 and fax out for bed offers.               Expected Discharge Plan: Skilled Nursing Facility Barriers to Discharge: Continued Medical Work up   Patient Goals and CMS Choice   CMS Medicare.gov Compare Post Acute Care list provided to:: Patient Represenative (must comment) (Daughter) Choice offered to / list presented to : Adult Children  Expected Discharge Plan and Services Expected Discharge Plan: [place]   Discharge Planning Services: CM Consult Post Acute Care Choice: [place] Living arrangements for the past 2 months: Single Family Home                                      Prior Living Arrangements/Services Living arrangements for the past 2 months: Single Family Home Lives with:: Self Patient language and need for interpreter reviewed:: Yes        Need for Family Participation in Patient Care: Yes (Comment) Care giver support system in place?: Yes (comment)   Criminal Activity/Legal Involvement Pertinent to Current  Situation/Hospitalization: No - Comment as needed  Activities of Daily Living Home Assistive Devices/Equipment: None ADL Screening (condition at time of admission) Patient's cognitive ability adequate to safely complete daily activities?: No Is the patient deaf or have difficulty hearing?: No Does the patient have difficulty seeing, even when wearing glasses/contacts?: No Does the patient have difficulty concentrating, remembering, or making decisions?: Yes Patient able to express need for assistance with ADLs?: No Does the patient have difficulty dressing or bathing?: Yes Independently performs ADLs?: No Does the patient have difficulty walking or climbing stairs?: Yes Weakness of Legs: Left Weakness of Arms/Hands: Left  Permission Sought/Granted                  Emotional Assessment Appearance:: Appears stated age Attitude/Demeanor/Rapport: Engaged Affect (typically observed): Appropriate Orientation: : Oriented to Self      Admission diagnosis:  Cough [R05.9] Acute ischemic stroke [facility]) [I63.9] Patient Active Problem List   Diagnosis Date Noted  . Acute ischemic stroke ([place]) 08/19/2020  . TIA (transient ischemic attack) 08/13/2020  . Mild cognitive impairment 03/09/2018  . Pruritus 03/09/2018  . CAD S/P percutaneous coronary angioplasty 04/27/2017  . Dementia ([place]) 04/27/2017  . Dyslipidemia 04/07/2017  . Non-insulin treated type 2 diabetes mellitus ([place]) 04/06/2017  . Essential hypertension 04/06/2017  . History of non-ST elevation myocardial infarction (NSTEMI) 04/03/2017   PCP:  Philmore Pali, NP Pharmacy:   [id number] -  [place], [address] [place] [address] Phone: [phone] Fax: [place] [facility] - [place], [address] [place] [address] Phone: [phone] Fax: [phone]     Social Determinants of Health (SDOH)  Interventions    Readmission Risk Interventions No flowsheet data found.  Reinaldo Raddle, RN, BSN  Trauma/Neuro ICU Case Manager [phone]

## 2020-08-21 NOTE — Progress Notes (Signed)
  Speech Language Pathology Treatment: Dysphagia  Patient Details Name: Shawna Lynch MRN: [id number] DOB: [birth year] Today's Date: 08/21/2020 Time: 4758-3074 SLP Time Calculation (min) (ACUTE ONLY): 8 min  Assessment / Plan / Recommendation Clinical Impression  Pt was seen with regular solids and thin liquids with oropharyngeal swallowing appearing to be [facility]. No overt s/s of aspiration were observed and cognitively, she seemed to be more appropriate with eating and self-feeding compared to her initial evaluation. Recommend continuing regular solids and thin liquids with assistance during meals PRN for mentation. SLP to s/o for swallowing - will continue to follow for cognition only.     HPI HPI: Shawna Lynch is a 85 y.o. female past medical history of dementia, coronary artery disease, diabetes, hypertension, presented to the emergency room for evaluation of sudden onset of right-sided weakness which was resolved after a little while and then had sudden onset of left-sided weakness and neglect.The patient was recently seen for a left hemispheric TIA when she had presented on 08/14/2020 with right facial droop and right-sided weakness transient symptoms.  CXR 11/21: "Findings concerning for volume overload with developing pulmonaryedema. Superimposed atypical infectious process is not excluded inthe appropriate clinical setting."  MRI 11/22 with no acute findings      SLP Plan  All goals met       Recommendations  Diet recommendations: Regular;Thin liquid Liquids provided via: Cup;Straw Medication Administration: Whole meds with liquid Supervision: Patient able to self feed;Intermittent supervision to cue for compensatory strategies Compensations: Slow rate;Small sips/bites Postural Changes and/or Swallow Maneuvers: Seated upright 90 degrees                Oral Care Recommendations: Oral care BID Follow up Recommendations:  (none for dysphagia) SLP Visit Diagnosis: Dysphagia,  unspecified (R13.10) Plan: All goals met       GO                Osie Bond., M.A. [facility] Pager [phone] Office [phone]  08/21/2020, 11:18 AM

## 2020-08-22 LAB — BASIC METABOLIC PANEL
Anion gap: 13 (ref 5–15)
BUN: 13 mg/dL (ref 8–23)
CO2: 23 mmol/L (ref 22–32)
Calcium: 8.8 mg/dL — ABNORMAL LOW (ref 8.9–10.3)
Chloride: 103 mmol/L (ref 98–111)
Creatinine, Ser: 1.09 mg/dL — ABNORMAL HIGH (ref 0.44–1.00)
GFR, Estimated: 50 mL/min — ABNORMAL LOW (ref >60)
Glucose, Bld: 228 mg/dL — ABNORMAL HIGH (ref 70–99)
Potassium: 3.4 mmol/L — ABNORMAL LOW (ref 3.5–5.1)
Sodium: 139 mmol/L (ref 135–145)

## 2020-08-22 LAB — CBC
HCT: 36.4 % (ref 36.0–46.0)
Hemoglobin: 12.2 g/dL (ref 12.0–15.0)
MCH: 30.3 pg (ref 26.0–34.0)
MCHC: 33.5 g/dL (ref 30.0–36.0)
MCV: 90.3 fL (ref 80.0–100.0)
Platelets: 186 10*3/uL (ref 150–400)
RBC: 4.03 MIL/uL (ref 3.87–5.11)
RDW: 12.1 % (ref 11.5–15.5)
WBC: 6 10*3/uL (ref 4.0–10.5)
nRBC: 0 % (ref 0.0–0.2)

## 2020-08-22 LAB — GLUCOSE, CAPILLARY
Glucose-Capillary: 207 mg/dL — ABNORMAL HIGH (ref 70–99)
Glucose-Capillary: 260 mg/dL — ABNORMAL HIGH (ref 70–99)
Glucose-Capillary: 225 mg/dL — ABNORMAL HIGH (ref 70–99)
Glucose-Capillary: 173 mg/dL — ABNORMAL HIGH (ref 70–99)

## 2020-08-22 MED ORDER — QUETIAPINE FUMARATE 25 MG PO TABS
12.5000 mg | ORAL_TABLET | Freq: Every day | ORAL | Status: DC
  Administered 2020-08-22 – 2020-08-23 (×2): 12.5 mg via ORAL
  Filled 2020-08-22 (×2): qty 1

## 2020-08-22 NOTE — Progress Notes (Signed)
Inpatient Diabetes Program Recommendations  AACE/ADA: New Consensus Statement on Inpatient Glycemic Control (2015)  Target Ranges:  Prepandial:   less than 140 mg/dL      Peak postprandial:   less than 180 mg/dL (1-2 hours)      Critically ill patients:  140 - 180 mg/dL   Results for Shawna Lynch, Shawna Lynch (MRN [id number]) as of 08/22/2020 11:50  Ref. Range 08/21/2020 12:58 08/21/2020 17:18 08/21/2020 21:30  Glucose-Capillary Latest Ref Range: 70 - 99 mg/dL 229 (H)  3 units NOVOLOG  164 (H)  2 units NOVOLOG  250 (H)  3 units NOVOLOG    Results for Shawna Lynch, Shawna Lynch (MRN [id number]) as of 08/22/2020 11:50  Ref. Range 08/22/2020 08:09  Glucose-Capillary Latest Ref Range: 70 - 99 mg/dL 207 (H)  3 units NOVOLOG     Home DM Meds: Lantus 10 units QHS       Metformin 500 mg Daily  Current Orders: Novolog Sensitive Correction Scale/ SSI (0-9 units) TID AC + HS    MD- Note patient takes Lantus at home  CBG 207 this AM  Please consider starting Lantus 7 units QHS (70% home dose)    --Will follow patient during hospitalization--  Wyn Quaker RN, MSN, CDE Diabetes Coordinator Inpatient Glycemic Control Team Team Pager: [phone] (8a-5p)

## 2020-08-22 NOTE — Progress Notes (Signed)
STROKE TEAM PROGRESS NOTE   INTERVAL HISTORY Patient is neurologically stable without any changes but still does not have floor bed to be transferred to.  Her son is at the bedside.  They feel that they may want to take the patient home but he has to discuss with his sister to see if they can arrange for 24-hour care.  If possible they would prefer this when she got worse skilled nursing facility.  Vital signs stable.  No new neurological issues.  She does get a little sundowning and agitated at night and does not sleep well.  Vitals:   08/22/20 0500 08/22/20 0600 08/22/20 0700 08/22/20 0811  BP: (!) 158/76 (!) 149/78 (!) 188/87   Pulse:   72   Resp: 14  11   Temp:    98.1 F (36.7 C)  TempSrc:    Oral  SpO2:   98%   Weight:       CBC:  Recent Labs  Lab 08/19/20 2257 08/19/20 2257 08/19/20 2308 08/22/20 0733  WBC 8.8  --   --  6.0  NEUTROABS 6.1  --   --   --   HGB 11.8*   < > 11.9* 12.2  HCT 36.7   < > 35.0* 36.4  MCV 92.0  --   --  90.3  PLT 190  --   --  186   < > = values in this interval not displayed.   Basic Metabolic Panel:  Recent Labs  Lab 08/19/20 2257 08/19/20 2308  NA 133* 134*  K 3.7 3.6  CL 96* 96*  CO2 24  --   GLUCOSE 244* 237*  BUN 21 23  CREATININE 1.37* 1.30*  CALCIUM 9.7  --    Lab Results  Component Value Date   CHOL 109 08/13/2020   HDL 44 08/13/2020   LDLCALC 23 08/13/2020   TRIG 211 (H) 08/13/2020   CHOLHDL 2.5 08/13/2020   Lab Results  Component Value Date   HGBA1C 8.9 (H) 08/13/2020    Urine Drug Screen: No results for input(s): LABOPIA, COCAINSCRNUR, LABBENZ, AMPHETMU, THCU, LABBARB in the last 168 hours.  Alcohol Level No results for input(s): ETH in the last 168 hours.  IMAGING past 24 hours CT HEAD WO CONTRAST  Result Date: 08/21/2020 CLINICAL DATA:  Stroke, follow-up. EXAM: CT HEAD WITHOUT CONTRAST TECHNIQUE: Contiguous axial images were obtained from the base of the skull through the vertex without intravenous contrast.  COMPARISON:  Brain MRI 08/20/2020. Noncontrast head CT, CT angiogram head/neck and CT perfusion 08/19/2020. FINDINGS: Brain: Moderate cerebral atrophy. Advanced ill-defined hypoattenuation within the cerebral white matter is nonspecific, but compatible with chronic small vessel ischemic disease. There is no acute intracranial hemorrhage. No demarcated cortical infarct. No extra-axial fluid collection. No evidence of intracranial mass. No midline shift. Vascular: No hyperdense vessel.  Atherosclerotic calcifications Skull: Normal. Negative for fracture or focal lesion. Sinuses/Orbits: Visualized orbits show no acute finding. Small volume frothy secretions within the left sphenoid sinus. Mild bilateral ethmoid and right maxillary sinus mucosal thickening. Other: Small bilateral mastoid effusions. IMPRESSION: No CT evidence of acute intracranial abnormality. Redemonstrated moderate cerebral atrophy and advanced chronic small vessel ischemic disease. Paranasal sinus disease as described. Small bilateral mastoid effusions. Electronically Signed   By: Kellie Simmering DO   On: 08/21/2020 11:58    PHYSICAL EXAM     Constitutional: Calm, appropriate for condition  Cardiovascular: Normal RR Respiratory: No increased WOB   Mental status: Oriented to self only, follows  commands  Speech: Fluent with repetition and naming intact, no dysarthria noted Cranial nerves: EOMI, VFF, but sensory inattention on the left.  Face symmetric, Tongue midline, Shoulder shrug intact  Motor: Normal bulk and tone. No drift.   Dlt Bic Tri FgS Grp HF  KnF KnE PIF DoF  R 5 5 5 5 5 5 5 5 5 5   L 5 5 5 5 5 5 5 5 5 5   Sensory: Intact to light tough throughout sensory inattention on the left. Coordination: Intact FNF  Reflexes: Not assessed Gait: Deferred      ASSESSMENT/PLAN Shawna Lynch is a 85 y.o. female with history of  DM, CAD s/p DES, HTN and prior TIA presenting with left-sided weakness and neglect with NIH stroke scale  of 9 with concern for right hemispheric infarct and was treated with IV TPA with resolution of her symptoms.   Stroke like symptoms s/p tPA, MRI negative but symptoms remain, therefore dx R brain Stroke not seen on imaging  Code Stroke CT head No acute abnormality. Small vessel disease. Atrophy. ASPECTS 10.     CTA head & neck no LVO, B ICA bifurcation R 75%, L 50%  CT perfusion negative  MRI  No acute abnormality. Small vessel disease. Atrophy.   Repeat CT head neg for stroke    2D Echo EF 60-65%. No source of embolus   LDL 23  HgbA1c 8.9  VTE prophylaxis - SCDs -> Lovenox 40 mg sq daily    aspirin 81 mg daily and Brilinta (ticagrelor) 90 mg bid prior to admission, now on No antithrombotic. Resume DAPT w/ brilinta and continue at d/c  Therapy recommendations:  SNF  Disposition:  pending - lives at home with daytime sitter, family considering hs sitter once she returns home from SNF - they prefer [facility] and have contacted them - bed available end of the week   Discussed further with son today - he is going to look for help at home at night to start now and maybe take home w/o SNF  Awaiting bed on the floor. Will change to non-tele  R ICA stenosis   Possibly symptomatic, possible etiology of stroke  R ICA 75%  Not a surgical candidate given advanced dementia  Hypertension  Home meds:  Coreg 25 mg BID + Imdur 60 mg   Stable . Long-term BP goal normotensive  Hyperlipidemia  Home meds:  lipitor 80 + omega 3  LDL 23, goal < 70  Resume lipitor at half dose given low LDL  Continue statin at discharge  Diabetes type II Uncontrolled  Home meds:  Metformin 500 QD  HgbA1c 8.9, goal < 7.0  Add CBGs Recent Labs    08/21/20 1718 08/21/20 2130 08/22/20 0809  GLUCAP 164* 250* 207*      Add SSI  Other Stroke Risk Factors  Advanced Age >/= 28   Former Cigarette smoker  Coronary artery disease, NSTEMI 2018  Hx TIA  Other Active Problems  Baseline  dementia requiring daytime sitter PTA. On lexapro.   Insomnia. Added seroquel at hs  IVF to 50cc/hr. Cr 1.09. will d/c IVF.   Hospital day # 3  Plan add low-dose Seroquel at night to help with sleep and agitation and sundowning.  Continue ongoing therapies.  Discharge home after family has arranged for 24-hour care hopefully over the next few days.  Long discussion with patient and son and answered questions.  Greater than 50% time during this 25-minute visit was spent  in counseling and coordination of care and discussion with care team. Antony Contras, MD   To contact Stroke Continuity provider, please refer to [email or web address] After hours, contact General Neurology

## 2020-08-23 LAB — GLUCOSE, CAPILLARY
Glucose-Capillary: 192 mg/dL — ABNORMAL HIGH (ref 70–99)
Glucose-Capillary: 326 mg/dL — ABNORMAL HIGH (ref 70–99)
Glucose-Capillary: 208 mg/dL — ABNORMAL HIGH (ref 70–99)
Glucose-Capillary: 164 mg/dL — ABNORMAL HIGH (ref 70–99)

## 2020-08-23 MED ORDER — LABETALOL HCL 5 MG/ML IV SOLN
20.0000 mg | INTRAVENOUS | Status: DC | PRN
  Administered 2020-08-23: 20 mg via INTRAVENOUS
  Filled 2020-08-23: qty 4

## 2020-08-23 NOTE — Progress Notes (Signed)
STROKE TEAM PROGRESS NOTE   INTERVAL HISTORY Patient is lying comfortably in bed.  Her daughter is at the bedside.  Her symptoms of left-sided neglect and sensory and visual inattention and left-sided weakness has improved.  Blood pressure adequately controlled and now she is on a floor bed.  Family wants her to go home and is waiting on arrangements of 24-hour caregivers which will happen hopefully tomorrow Vitals:   08/23/20 0500 08/23/20 0915 08/23/20 1030 08/23/20 1145  BP: 135/86 (!) 191/117 (!) 157/86 119/74  Pulse: 88 97  91  Resp: 14 15  18   Temp: 98.3 F (36.8 C) (!) 97.4 F (36.3 C)  98 F (36.7 C)  TempSrc: Oral Oral  Oral  SpO2: 100% 98%  98%  Weight:       CBC:  Recent Labs  Lab 08/19/20 2257 08/19/20 2257 08/19/20 2308 08/22/20 0733  WBC 8.8  --   --  6.0  NEUTROABS 6.1  --   --   --   HGB 11.8*   < > 11.9* 12.2  HCT 36.7   < > 35.0* 36.4  MCV 92.0  --   --  90.3  PLT 190  --   --  186   < > = values in this interval not displayed.   Basic Metabolic Panel:  Recent Labs  Lab 08/19/20 2257 08/19/20 2257 08/19/20 2308 08/22/20 0733  NA 133*   < > 134* 139  K 3.7   < > 3.6 3.4*  CL 96*   < > 96* 103  CO2 24  --   --  23  GLUCOSE 244*   < > 237* 228*  BUN 21   < > 23 13  CREATININE 1.37*   < > 1.30* 1.09*  CALCIUM 9.7  --   --  8.8*   < > = values in this interval not displayed.   Lab Results  Component Value Date   CHOL 109 08/13/2020   HDL 44 08/13/2020   LDLCALC 23 08/13/2020   TRIG 211 (H) 08/13/2020   CHOLHDL 2.5 08/13/2020   Lab Results  Component Value Date   HGBA1C 8.9 (H) 08/13/2020    Urine Drug Screen: No results for input(s): LABOPIA, COCAINSCRNUR, LABBENZ, AMPHETMU, THCU, LABBARB in the last 168 hours.  Alcohol Level No results for input(s): ETH in the last 168 hours.  IMAGING past 24 hours No results found.  PHYSICAL EXAM    Constitutional: Calm, appropriate for condition  Cardiovascular: Normal RR Respiratory: No increased  WOB   Mental status: Oriented to self only, follows commands  Speech: Fluent with repetition and naming intact, no dysarthria noted Cranial nerves: EOMI, VFF, but sensory inattention on the left.  Face symmetric, Tongue midline, Shoulder shrug intact  Motor: Normal bulk and tone. No drift.   Dlt Bic Tri FgS Grp HF  KnF KnE PIF DoF  R 5 5 5 5 5 5 5 5 5 5   L 5 5 5 5 5 5 5 5 5 5   Sensory: Intact to light tough throughout sensory inattention on the left. Coordination: Intact FNF  Reflexes: Not assessed Gait: Deferred      ASSESSMENT/PLAN Ms. Shawna Lynch is a 85 y.o. female with history of  DM, CAD s/p DES, HTN and prior TIA presenting with left-sided weakness and neglect with NIH stroke scale of 9 with concern for right hemispheric infarct and was treated with IV TPA with resolution of her symptoms.   Stroke like  symptoms s/p tPA, MRI negative but symptoms remain, therefore dx R brain Stroke not seen on imaging  Code Stroke CT head No acute abnormality. Small vessel disease. Atrophy. ASPECTS 10.     CTA head & neck no LVO, B ICA bifurcation R 75%, L 50%  CT perfusion negative  MRI  No acute abnormality. Small vessel disease. Atrophy.   Repeat CT head neg for stroke    2D Echo EF 60-65%. No source of embolus   LDL 23  HgbA1c 8.9  VTE prophylaxis - SCDs -> Lovenox 40 mg sq daily    aspirin 81 mg daily and Brilinta (ticagrelor) 90 mg bid prior to admission, now on No antithrombotic. Resume DAPT w/ brilinta and continue at d/c  Therapy recommendations:  SNF  Disposition:  pending - lives at home with daytime sitter, family considering hs sitter once she returns home from SNF - they prefer [facility] and have contacted them  Transfer to the floor  R ICA stenosis   Possibly symptomatic, possible etiology of stroke  R ICA 75%  Not a surgical candidate given advanced dementia  Hypertension  Home meds:  Coreg 25 mg BID + Imdur 60 mg   Stable . Long-term BP goal  normotensive  Hyperlipidemia  Home meds:  lipitor 80 + omega 3  LDL 23, goal < 70  Resume lipitor at half dose given LDL  Continue statin at discharge  Diabetes type II Uncontrolled  Home meds:  Metformin 500 QD  HgbA1c 8.9, goal < 7.0  Add CBGs Recent Labs    08/22/20 2242 08/23/20 0953 08/23/20 1234  GLUCAP 173* 192* 326*      Add SSI  Other Stroke Risk Factors  Advanced Age >/= 87   Former Cigarette smoker  Coronary artery disease, NSTEMI 2018  Hx TIA  Other Active Problems  Baseline dementia requiring daytime sitter PTA  IVF to 50cc/hr. Check labs.  Hospital day # 4 Patient`s  symptoms of left-sided inattention both visually and sensation appear to be improving though MRI scan as well as follow-up CT scan showed no evidence of new right brain stroke.  This likely is a strokelike episode doubt this is seizure.  Recommend resume home antiplatelet therapy of aspirin and Brilinta.  Mobilize out of bed.  Continue ongoing therapies.  Likely discharge home tomorrow if family has made 24-hour supervision arrangements at home.  Long discussion with patient and daughter and answered questions.  Long discussion patient and son and answered questions.  Greater than 50% time during this 25-minute visit was spent on counseling and coordination of care about her strokelike episode discussion about rehabilitation and disposition plan and answering questions. Antony Contras, MD  To contact Stroke Continuity provider, please refer to [email or web address] After hours, contact General Neurology

## 2020-08-23 NOTE — Progress Notes (Signed)
   08/23/20 0915  Vitals  BP (!) 191/117 (RN Notified)  MAP (mmHg) 137  BP Location Right Arm  BP Method Automatic  Patient Position (if appropriate) Lying   Notified by NT of BP. Dr. Leonie Man notified of findings. New orders in [facility].

## 2020-08-23 NOTE — Progress Notes (Signed)
   08/23/20 1030  Vitals  BP (!) 157/86  BP Location Right Arm  BP Method Automatic  Patient Position (if appropriate) Lying   BP improved after 20mg  Labetalol IV

## 2020-08-24 LAB — GLUCOSE, CAPILLARY
Glucose-Capillary: 169 mg/dL — ABNORMAL HIGH (ref 70–99)
Glucose-Capillary: 320 mg/dL — ABNORMAL HIGH (ref 70–99)

## 2020-08-24 MED ORDER — ATORVASTATIN CALCIUM 80 MG PO TABS: 40.0000 mg | ORAL_TABLET | Freq: Every day | ORAL | 1 refills | Status: DC

## 2020-08-24 NOTE — Discharge Summary (Addendum)
Stroke Discharge Summary  Patient ID: Shawna Lynch   MRN: [id number]      DOB: [birth year]  Date of Admission: 08/19/2020 Date of Discharge: 08/24/2020  Attending Physician:  Garvin Fila, MD, Stroke MD Consultant(s):    None  Patient's PCP:  Philmore Pali, NP  DISCHARGE DIAGNOSIS:  Principal Problem:   Acute ischemic stroke ([place]) - R brain stroke not seen on imaging Active Problems:   History of non-ST elevation myocardial infarction (NSTEMI)   Non-insulin treated type 2 diabetes mellitus ([place])   Essential hypertension   Dyslipidemia   Dementia ([place])   Allergies as of 08/24/2020       Reactions   Penicillins Hives   Sulfa Antibiotics Rash   Vicodin [hydrocodone-acetaminophen] Nausea And Vomiting   Codeine Rash        Medication List     STOP taking these medications    isosorbide mononitrate 60 MG 24 hr tablet Commonly known as: IMDUR       TAKE these medications    aspirin 81 MG EC tablet Take 1 tablet (81 mg total) by mouth daily.   atorvastatin 80 MG tablet Commonly known as: LIPITOR Take 0.5 tablets (40 mg total) by mouth daily at 6 PM. What changed: how much to take   carvedilol 25 MG tablet Commonly known as: COREG Take 12.5 mg by mouth 2 (two) times daily with a meal.   cholecalciferol 1000 units tablet Commonly known as: VITAMIN D Take 1,000 Units by mouth daily.   escitalopram 10 MG tablet Commonly known as: LEXAPRO Take 10 mg by mouth daily.   Lantus SoloStar 100 UNIT/ML Solostar Pen Generic drug: insulin glargine Inject 10 Units into the skin at bedtime.   metFORMIN 500 MG tablet Commonly known as: GLUCOPHAGE Take 500 mg by mouth daily.   nitroGLYCERIN 0.4 MG SL tablet Commonly known as: NITROSTAT Place 1 tablet (0.4 mg total) under the tongue every 5 (five) minutes x 3 doses as needed for chest pain. What changed:  when to take this reasons to take this   omega-3 acid ethyl esters 1 g capsule Commonly known as:  LOVAZA Take 1 capsule (1 g total) by mouth 2 (two) times daily.   ticagrelor 90 MG Tabs tablet Commonly known as: Brilinta Take 1 tablet (90 mg total) by mouth 2 (two) times daily.   vitamin B-12 1000 MCG tablet Commonly known as: CYANOCOBALAMIN Take 1,000 mcg by mouth daily.        LABORATORY STUDIES CBC    Component Value Date/Time   WBC 6.0 08/22/2020 0733   RBC 4.03 08/22/2020 0733   HGB 12.2 08/22/2020 0733   HGB 11.4 (L) 12/17/2012 1305   HCT 36.4 08/22/2020 0733   HCT 34.5 (L) 12/17/2012 1305   PLT 186 08/22/2020 0733   PLT 244 12/17/2012 1305   MCV 90.3 08/22/2020 0733   MCV 91 12/17/2012 1305   MCH 30.3 08/22/2020 0733   MCHC 33.5 08/22/2020 0733   RDW 12.1 08/22/2020 0733   RDW 14.1 12/17/2012 1305   LYMPHSABS 1.7 08/19/2020 2257   MONOABS 0.7 08/19/2020 2257   EOSABS 0.2 08/19/2020 2257   BASOSABS 0.0 08/19/2020 2257   CMP    Component Value Date/Time   NA 139 08/22/2020 0733   NA 139 12/17/2012 1305   K 3.4 (L) 08/22/2020 0733   K 4.0 12/17/2012 1305   CL 103 08/22/2020 0733   CL 102 12/17/2012 1305   CO2  23 08/22/2020 0733   CO2 29 12/17/2012 1305   GLUCOSE 228 (H) 08/22/2020 0733   GLUCOSE 151 (H) 12/17/2012 1305   BUN 13 08/22/2020 0733   BUN 15 12/17/2012 1305   CREATININE 1.09 (H) 08/22/2020 0733   CREATININE 1.00 12/17/2012 1305   CALCIUM 8.8 (L) 08/22/2020 0733   CALCIUM 9.2 12/17/2012 1305   PROT 6.1 (L) 08/19/2020 2257   PROT 8.1 12/17/2012 1305   ALBUMIN 3.1 (L) 08/19/2020 2257   ALBUMIN 3.7 12/17/2012 1305   AST 21 08/19/2020 2257   AST 23 12/17/2012 1305   ALT 24 08/19/2020 2257   ALT 17 12/17/2012 1305   ALKPHOS 42 08/19/2020 2257   ALKPHOS 54 12/17/2012 1305   BILITOT 1.0 08/19/2020 2257   BILITOT 0.4 12/17/2012 1305   GFRNONAA 50 (L) 08/22/2020 0733   GFRNONAA 55 (L) 12/17/2012 1305   GFRAA 34 (L) 08/18/2017 0852   GFRAA >60 12/17/2012 1305   COAGS Lab Results  Component Value Date   INR 1.2 08/19/2020   INR 1.0  08/13/2020   INR 0.93 04/03/2017   Lipid Panel    Component Value Date/Time   CHOL 109 08/13/2020 1543   TRIG 211 (H) 08/13/2020 1543   HDL 44 08/13/2020 1543   CHOLHDL 2.5 08/13/2020 1543   VLDL 42 (H) 08/13/2020 1543   LDLCALC 23 08/13/2020 1543   HgbA1C  Lab Results  Component Value Date   HGBA1C 8.9 (H) 08/13/2020   Urinalysis    Component Value Date/Time   COLORURINE YELLOW 08/13/2020 1556   APPEARANCEUR HAZY (A) 08/13/2020 1556   APPEARANCEUR Clear 12/17/2012 1305   LABSPEC 1.016 08/13/2020 1556   LABSPEC 1.017 12/17/2012 1305   PHURINE 6.0 08/13/2020 1556   GLUCOSEU 150 (A) 08/13/2020 1556   GLUCOSEU Negative 12/17/2012 1305   HGBUR NEGATIVE 08/13/2020 [employer] 08/13/2020 1556   BILIRUBINUR Negative 12/17/2012 [place] 08/13/2020 1556   PROTEINUR NEGATIVE 08/13/2020 1556   NITRITE POSITIVE (A) 08/13/2020 1556   LEUKOCYTESUR MODERATE (A) 08/13/2020 1556   LEUKOCYTESUR Trace 12/17/2012 1305   Urine Drug Screen     Component Value Date/Time   LABOPIA NONE DETECTED 08/13/2020 1556   COCAINSCRNUR NONE DETECTED 08/13/2020 1556   LABBENZ NONE DETECTED 08/13/2020 1556   AMPHETMU NONE DETECTED 08/13/2020 1556   THCU NONE DETECTED 08/13/2020 1556   LABBARB NONE DETECTED 08/13/2020 1556    Alcohol Level    Component Value Date/Time   ETH <10 08/13/2020 1233    SIGNIFICANT DIAGNOSTIC STUDIES EEG  Result Date: 08/14/2020 Lora Havens, MD     08/14/2020  3:07 PM Patient Name: Shawna Lynch MRN: [id number] Epilepsy Attending: Lora Havens Referring Physician/Provider: Dr. Rosalin Hawking Date: 08/14/2020 Duration: 24.45 minutes Patient history: 85 year old female who presented with slurred speech and left facial droop.  EEG to evaluate for seizures. Level of alertness: Awake AEDs during EEG study: None Technical aspects: This EEG study was done with scalp electrodes positioned according to the 10-20 International system of  electrode placement. Electrical activity was acquired at a sampling rate of 500Hz  and reviewed with a high frequency filter of 70Hz  and a low frequency filter of 1Hz . EEG data were recorded continuously and digitally stored. Description: The posterior dominant rhythm consists of 8 Hz activity of moderate voltage (25-35 uV) seen predominantly in posterior head regions, symmetric and reactive to eye opening and eye closing. EEG showed intermittent generalized and maximal left temporal 3 to 5  Hz theta-delta slowing.  Hyperventilation and photic stimulation were not performed.   ABNORMALITY -Intermittent slow, generalized and maximal left temporal region IMPRESSION: This study is suggestive of nonspecific cortical dysfunction in left temporal region as well as mild diffuse encephalopathy, nonspecific etiology.  No seizures or epileptiform discharges were seen throughout the recording. Lora Havens   CT Code Stroke CTA Head W/WO contrast  Result Date: 08/19/2020 CLINICAL DATA:  Initial evaluation for possible stroke, initially presenting with right-sided weakness, speech difficulty, now with left-sided neglect. EXAM: CT ANGIOGRAPHY HEAD AND NECK CT PERFUSION BRAIN TECHNIQUE: Multidetector CT imaging of the head and neck was performed using the standard protocol during bolus administration of intravenous contrast. Multiplanar CT image reconstructions and MIPs were obtained to evaluate the vascular anatomy. Carotid stenosis measurements (when applicable) are obtained utilizing NASCET criteria, using the distal internal carotid diameter as the denominator. Multiphase CT imaging of the brain was performed following IV bolus contrast injection. Subsequent parametric perfusion maps were calculated using RAPID software. CONTRAST:  61mL OMNIPAQUE IOHEXOL 350 MG/ML SOLN COMPARISON:  Prior CT from earlier same day as well as recent CTA from 08/14/2020. FINDINGS: CTA NECK FINDINGS Aortic arch: Visualized arch of normal  caliber with normal 3 vessel morphology. Atheromatous change about the arch itself and origin of the great vessels without hemodynamically significant stenosis. Right carotid system: Right CCA patent from its origin to the bifurcation without stenosis or other abnormality. Eccentric calcified plaque at the origin of the right ICA with up to 75% stenosis by NASCET criteria, relatively stable from previous. Multifocal irregularity within the distal cervical left ICA noted as well, which could be atheromatous related or possibly related to mild changes of FMD, also stable (series 7, image 178). No associated stenosis, dissection, or other complication. Left carotid system: Left CCA patent from its origin to the bifurcation without stenosis. Bulky calcified plaque at the origin of the left ICA with associated stenosis of up to 50% by NASCET criteria, stable from previous. Left ICA otherwise patent distally to the skull base without stenosis, dissection or occlusion. Vertebral arteries: Both vertebral arteries arise from the subclavian arteries. No proximal subclavian artery stenosis. Both vertebral arteries patent within the neck without stenosis, dissection or occlusion. Skeleton: No visible acute osseous abnormality. Advanced degenerative changes about the C1-2 articulation. Moderate multilevel cervical spondylosis noted elsewhere. Visualized osseous structures demonstrate a somewhat mottled appearance. Other neck: No other acute soft tissue abnormality within the neck. No mass lesion or adenopathy. Upper chest: Visualized upper chest demonstrates no acute finding. Partially visualized lungs are grossly clear. Review of the MIP images confirms the above findings CTA HEAD FINDINGS Anterior circulation: Petrous segments widely patent bilaterally. Mild atheromatous change within the carotid siphons without hemodynamically significant stenosis. A1 segments widely patent. Normal anterior communicating artery complex.  Anterior cerebral arteries widely patent bilaterally. Normal in stenosis or occlusion. Negative MCA bifurcations. No proximal MCA branch occlusion. Distal MCA branches well perfused. Posterior circulation: Vertebral arteries patent to the vertebrobasilar junction without stenosis. Patent left PICA. Right PICA not seen. Basilar patent to its distal aspect without stenosis. Superior cerebral arteries patent bilaterally. Both PCAs primarily supplied via the basilar well perfused or distal aspects. Venous sinuses: Grossly patent allowing for timing the contrast bolus. Anatomic variants: None significant.  No aneurysm. Review of the MIP images confirms the above findings CT Brain Perfusion Findings: ASPECTS: 10. CBF (<30%) Volume: 15mL Perfusion (Tmax>6.0s) volume: 56mL Mismatch Volume: 42mL Infarction Location:Negative CT perfusion for acute core infarct. Apparent  8 cc perfusion deficit favored to be artifactual, as there is extensive motion artifact on this exam. IMPRESSION: 1. Negative CTA for emergent large vessel occlusion. 2. Negative CT perfusion with no evidence for acute core infarct. No definite perfusion abnormality, although examination limited by motion. 3. Atheromatous change about the carotid bifurcations bilaterally, with associated stenoses of up to 75% on the right and 50% on the left, stable. Critical Value/emergent results were called by telephone at the time of interpretation on 08/19/2020 at approximately 10:25 pm to provider [facility] , who verbally acknowledged these results. Electronically Signed   By: Jeannine Boga M.D.   On: 08/19/2020 22:54   CT ANGIO HEAD W OR WO CONTRAST  Result Date: 08/14/2020 CLINICAL DATA:  Stroke/TIA EXAM: CT ANGIOGRAPHY HEAD AND NECK TECHNIQUE: Multidetector CT imaging of the head and neck was performed using the standard protocol during bolus administration of intravenous contrast. Multiplanar CT image reconstructions and MIPs were obtained to evaluate the  vascular anatomy. Carotid stenosis measurements (when applicable) are obtained utilizing NASCET criteria, using the distal internal carotid diameter as the denominator. CONTRAST:  85mL OMNIPAQUE IOHEXOL 350 MG/ML SOLN COMPARISON:  None. FINDINGS: CT HEAD FINDINGS Brain: There is no mass, hemorrhage or extra-axial collection. There is generalized atrophy without lobar predilection. There is no acute or chronic infarction. There is hypoattenuation of the periventricular white matter, most commonly indicating chronic ischemic microangiopathy. Skull: The visualized skull base, calvarium and extracranial soft tissues are normal. Sinuses/Orbits: Trace left mastoid effusion. Paranasal sinuses are clear. The orbits are normal. CTA NECK FINDINGS SKELETON: There is no bony spinal canal stenosis. No lytic or blastic lesion. OTHER NECK: Normal pharynx, larynx and major salivary glands. No cervical lymphadenopathy. Unremarkable thyroid gland. UPPER CHEST: No pneumothorax or pleural effusion. No nodules or masses. AORTIC ARCH: There is calcific atherosclerosis of the aortic arch. There is no aneurysm, dissection or hemodynamically significant stenosis of the visualized portion of the aorta. Conventional 3 vessel aortic branching pattern. The visualized proximal subclavian arteries are widely patent. RIGHT CAROTID SYSTEM: No dissection, occlusion or aneurysm. There is predominantly calcified atherosclerosis extending into the proximal ICA, resulting in 75% stenosis. LEFT CAROTID SYSTEM: No dissection, occlusion or aneurysm. There is predominantly calcified atherosclerosis extending into the proximal ICA, resulting in 50% stenosis. VERTEBRAL ARTERIES: Codominant configuration. Both origins are clearly patent. There is no dissection, occlusion or flow-limiting stenosis to the skull base (V1-V3 segments). CTA HEAD FINDINGS POSTERIOR CIRCULATION: --Vertebral arteries: Normal V4 segments. --Inferior cerebellar arteries: Normal.  --Basilar artery: Normal. --Superior cerebellar arteries: Normal. --Posterior cerebral arteries (PCA): Normal. ANTERIOR CIRCULATION: --Intracranial internal carotid arteries: Normal. --Anterior cerebral arteries (ACA): Normal. Both A1 segments are present. Patent anterior communicating artery (a-comm). --Middle cerebral arteries (MCA): Normal. VENOUS SINUSES: As permitted by contrast timing, patent. ANATOMIC VARIANTS: None Review of the MIP images confirms the above findings. IMPRESSION: 1. No intracranial arterial occlusion or high-grade stenosis. 2. Bilateral proximal internal carotid artery stenosis measuring 75 % on the right and 50 % on the left. Aortic Atherosclerosis (ICD10-I70.0). Electronically Signed   By: Ulyses Jarred M.D.   On: 08/14/2020 02:04   DG Chest 1 View  Result Date: 08/19/2020 CLINICAL DATA:  Right-sided facial droop. EXAM: CHEST  1 VIEW COMPARISON:  August 18, 2017 FINDINGS: The lung volumes are low. There is slightly prominent interstitial lung markings. There are few hazy airspace opacities bilaterally, most evident in the right mid lung zone and left retrocardiac region. There is no pneumothorax. The aortic calcifications are  noted. There is no significant pleural effusion. IMPRESSION: Findings concerning for volume overload with developing pulmonary edema. Superimposed atypical infectious process is not excluded in the appropriate clinical setting. Electronically Signed   By: Constance Holster M.D.   On: 08/19/2020 23:28   CT HEAD WO CONTRAST  Result Date: 08/21/2020 CLINICAL DATA:  Stroke, follow-up. EXAM: CT HEAD WITHOUT CONTRAST TECHNIQUE: Contiguous axial images were obtained from the base of the skull through the vertex without intravenous contrast. COMPARISON:  Brain MRI 08/20/2020. Noncontrast head CT, CT angiogram head/neck and CT perfusion 08/19/2020. FINDINGS: Brain: Moderate cerebral atrophy. Advanced ill-defined hypoattenuation within the cerebral white matter is  nonspecific, but compatible with chronic small vessel ischemic disease. There is no acute intracranial hemorrhage. No demarcated cortical infarct. No extra-axial fluid collection. No evidence of intracranial mass. No midline shift. Vascular: No hyperdense vessel.  Atherosclerotic calcifications Skull: Normal. Negative for fracture or focal lesion. Sinuses/Orbits: Visualized orbits show no acute finding. Small volume frothy secretions within the left sphenoid sinus. Mild bilateral ethmoid and right maxillary sinus mucosal thickening. Other: Small bilateral mastoid effusions. IMPRESSION: No CT evidence of acute intracranial abnormality. Redemonstrated moderate cerebral atrophy and advanced chronic small vessel ischemic disease. Paranasal sinus disease as described. Small bilateral mastoid effusions. Electronically Signed   By: Kellie Simmering DO   On: 08/21/2020 11:58   CT Code Stroke CTA Neck W/WO contrast  Result Date: 08/19/2020 CLINICAL DATA:  Initial evaluation for possible stroke, initially presenting with right-sided weakness, speech difficulty, now with left-sided neglect. EXAM: CT ANGIOGRAPHY HEAD AND NECK CT PERFUSION BRAIN TECHNIQUE: Multidetector CT imaging of the head and neck was performed using the standard protocol during bolus administration of intravenous contrast. Multiplanar CT image reconstructions and MIPs were obtained to evaluate the vascular anatomy. Carotid stenosis measurements (when applicable) are obtained utilizing NASCET criteria, using the distal internal carotid diameter as the denominator. Multiphase CT imaging of the brain was performed following IV bolus contrast injection. Subsequent parametric perfusion maps were calculated using RAPID software. CONTRAST:  33mL OMNIPAQUE IOHEXOL 350 MG/ML SOLN COMPARISON:  Prior CT from earlier same day as well as recent CTA from 08/14/2020. FINDINGS: CTA NECK FINDINGS Aortic arch: Visualized arch of normal caliber with normal 3 vessel  morphology. Atheromatous change about the arch itself and origin of the great vessels without hemodynamically significant stenosis. Right carotid system: Right CCA patent from its origin to the bifurcation without stenosis or other abnormality. Eccentric calcified plaque at the origin of the right ICA with up to 75% stenosis by NASCET criteria, relatively stable from previous. Multifocal irregularity within the distal cervical left ICA noted as well, which could be atheromatous related or possibly related to mild changes of FMD, also stable (series 7, image 178). No associated stenosis, dissection, or other complication. Left carotid system: Left CCA patent from its origin to the bifurcation without stenosis. Bulky calcified plaque at the origin of the left ICA with associated stenosis of up to 50% by NASCET criteria, stable from previous. Left ICA otherwise patent distally to the skull base without stenosis, dissection or occlusion. Vertebral arteries: Both vertebral arteries arise from the subclavian arteries. No proximal subclavian artery stenosis. Both vertebral arteries patent within the neck without stenosis, dissection or occlusion. Skeleton: No visible acute osseous abnormality. Advanced degenerative changes about the C1-2 articulation. Moderate multilevel cervical spondylosis noted elsewhere. Visualized osseous structures demonstrate a somewhat mottled appearance. Other neck: No other acute soft tissue abnormality within the neck. No mass lesion or adenopathy. Upper chest:  Visualized upper chest demonstrates no acute finding. Partially visualized lungs are grossly clear. Review of the MIP images confirms the above findings CTA HEAD FINDINGS Anterior circulation: Petrous segments widely patent bilaterally. Mild atheromatous change within the carotid siphons without hemodynamically significant stenosis. A1 segments widely patent. Normal anterior communicating artery complex. Anterior cerebral arteries widely  patent bilaterally. Normal in stenosis or occlusion. Negative MCA bifurcations. No proximal MCA branch occlusion. Distal MCA branches well perfused. Posterior circulation: Vertebral arteries patent to the vertebrobasilar junction without stenosis. Patent left PICA. Right PICA not seen. Basilar patent to its distal aspect without stenosis. Superior cerebral arteries patent bilaterally. Both PCAs primarily supplied via the basilar well perfused or distal aspects. Venous sinuses: Grossly patent allowing for timing the contrast bolus. Anatomic variants: None significant.  No aneurysm. Review of the MIP images confirms the above findings CT Brain Perfusion Findings: ASPECTS: 10. CBF (<30%) Volume: 75mL Perfusion (Tmax>6.0s) volume: 10mL Mismatch Volume: 55mL Infarction Location:Negative CT perfusion for acute core infarct. Apparent 8 cc perfusion deficit favored to be artifactual, as there is extensive motion artifact on this exam. IMPRESSION: 1. Negative CTA for emergent large vessel occlusion. 2. Negative CT perfusion with no evidence for acute core infarct. No definite perfusion abnormality, although examination limited by motion. 3. Atheromatous change about the carotid bifurcations bilaterally, with associated stenoses of up to 75% on the right and 50% on the left, stable. Critical Value/emergent results were called by telephone at the time of interpretation on 08/19/2020 at approximately 10:25 pm to provider [facility] , who verbally acknowledged these results. Electronically Signed   By: Jeannine Boga M.D.   On: 08/19/2020 22:54   CT ANGIO NECK W OR WO CONTRAST  Result Date: 08/14/2020 CLINICAL DATA:  Stroke/TIA EXAM: CT ANGIOGRAPHY HEAD AND NECK TECHNIQUE: Multidetector CT imaging of the head and neck was performed using the standard protocol during bolus administration of intravenous contrast. Multiplanar CT image reconstructions and MIPs were obtained to evaluate the vascular anatomy. Carotid  stenosis measurements (when applicable) are obtained utilizing NASCET criteria, using the distal internal carotid diameter as the denominator. CONTRAST:  52mL OMNIPAQUE IOHEXOL 350 MG/ML SOLN COMPARISON:  None. FINDINGS: CT HEAD FINDINGS Brain: There is no mass, hemorrhage or extra-axial collection. There is generalized atrophy without lobar predilection. There is no acute or chronic infarction. There is hypoattenuation of the periventricular white matter, most commonly indicating chronic ischemic microangiopathy. Skull: The visualized skull base, calvarium and extracranial soft tissues are normal. Sinuses/Orbits: Trace left mastoid effusion. Paranasal sinuses are clear. The orbits are normal. CTA NECK FINDINGS SKELETON: There is no bony spinal canal stenosis. No lytic or blastic lesion. OTHER NECK: Normal pharynx, larynx and major salivary glands. No cervical lymphadenopathy. Unremarkable thyroid gland. UPPER CHEST: No pneumothorax or pleural effusion. No nodules or masses. AORTIC ARCH: There is calcific atherosclerosis of the aortic arch. There is no aneurysm, dissection or hemodynamically significant stenosis of the visualized portion of the aorta. Conventional 3 vessel aortic branching pattern. The visualized proximal subclavian arteries are widely patent. RIGHT CAROTID SYSTEM: No dissection, occlusion or aneurysm. There is predominantly calcified atherosclerosis extending into the proximal ICA, resulting in 75% stenosis. LEFT CAROTID SYSTEM: No dissection, occlusion or aneurysm. There is predominantly calcified atherosclerosis extending into the proximal ICA, resulting in 50% stenosis. VERTEBRAL ARTERIES: Codominant configuration. Both origins are clearly patent. There is no dissection, occlusion or flow-limiting stenosis to the skull base (V1-V3 segments). CTA HEAD FINDINGS POSTERIOR CIRCULATION: --Vertebral arteries: Normal V4 segments. --Inferior cerebellar arteries:  Normal. --Basilar artery: Normal.  --Superior cerebellar arteries: Normal. --Posterior cerebral arteries (PCA): Normal. ANTERIOR CIRCULATION: --Intracranial internal carotid arteries: Normal. --Anterior cerebral arteries (ACA): Normal. Both A1 segments are present. Patent anterior communicating artery (a-comm). --Middle cerebral arteries (MCA): Normal. VENOUS SINUSES: As permitted by contrast timing, patent. ANATOMIC VARIANTS: None Review of the MIP images confirms the above findings. IMPRESSION: 1. No intracranial arterial occlusion or high-grade stenosis. 2. Bilateral proximal internal carotid artery stenosis measuring 75 % on the right and 50 % on the left. Aortic Atherosclerosis (ICD10-I70.0). Electronically Signed   By: Ulyses Jarred M.D.   On: 08/14/2020 02:04   MR BRAIN WO CONTRAST  Result Date: 08/20/2020 CLINICAL DATA:  Stroke, follow-up. EXAM: MRI HEAD WITHOUT CONTRAST TECHNIQUE: Multiplanar, multiecho pulse sequences of the brain and surrounding structures were obtained without intravenous contrast. COMPARISON:  Head CT August 19, 2020. MRI of the brain August 14, 2020. FINDINGS: Brain: No acute infarction, hemorrhage, hydrocephalus, extra-axial collection or mass lesion. Scattered and confluent foci of T2 hyperintensity are seen within the white matter of the cerebral hemispheres and within the pons, nonspecific, unchanged from prior MRI. Moderate parenchymal volume loss. Vascular: Normal flow voids. Skull and upper cervical spine: Normal marrow signal. Sinuses/Orbits: Bilateral lens surgery. Paranasal sinuses are clear. Other: Small bilateral mastoid effusion. IMPRESSION: 1. No acute intracranial abnormality. 2. Stable moderate parenchymal volume loss and chronic white matter disease. 3. Small bilateral mastoid effusion. Electronically Signed   By: Pedro Earls M.D.   On: 08/20/2020 11:04   MR BRAIN WO CONTRAST  Result Date: 08/14/2020 CLINICAL DATA:  Stroke follow-up EXAM: MRI HEAD WITHOUT CONTRAST  TECHNIQUE: Multiplanar, multiecho pulse sequences of the brain and surrounding structures were obtained without intravenous contrast. COMPARISON:  Brain MRI 11/28/2015 FINDINGS: BRAIN: No acute infarct, acute hemorrhage or extra-axial collection. Early confluent hyperintense T2-weighted signal of the periventricular and deep white matter, most commonly due to chronic ischemic microangiopathy. Diffuse, advanced atrophy. No chronic microhemorrhage. Normal midline structures. VASCULAR: Major flow voids are preserved. SKULL AND UPPER CERVICAL SPINE: Normal calvarium and skull base. Visualized upper cervical spine and soft tissues are normal. SINUSES/ORBITS: Bilateral mastoid effusions. Nasopharynx is clear. Paranasal sinuses are clear. Bilateral lens replacements. IMPRESSION: 1. No acute intracranial abnormality. 2. Diffuse, advanced atrophy and chronic ischemic microangiopathy. 3. Bilateral mastoid effusions. Electronically Signed   By: Ulyses Jarred M.D.   On: 08/14/2020 03:30   CT Code Stroke Cerebral Perfusion with contrast  Result Date: 08/19/2020 CLINICAL DATA:  Initial evaluation for possible stroke, initially presenting with right-sided weakness, speech difficulty, now with left-sided neglect. EXAM: CT ANGIOGRAPHY HEAD AND NECK CT PERFUSION BRAIN TECHNIQUE: Multidetector CT imaging of the head and neck was performed using the standard protocol during bolus administration of intravenous contrast. Multiplanar CT image reconstructions and MIPs were obtained to evaluate the vascular anatomy. Carotid stenosis measurements (when applicable) are obtained utilizing NASCET criteria, using the distal internal carotid diameter as the denominator. Multiphase CT imaging of the brain was performed following IV bolus contrast injection. Subsequent parametric perfusion maps were calculated using RAPID software. CONTRAST:  22mL OMNIPAQUE IOHEXOL 350 MG/ML SOLN COMPARISON:  Prior CT from earlier same day as well as recent  CTA from 08/14/2020. FINDINGS: CTA NECK FINDINGS Aortic arch: Visualized arch of normal caliber with normal 3 vessel morphology. Atheromatous change about the arch itself and origin of the great vessels without hemodynamically significant stenosis. Right carotid system: Right CCA patent from its origin to the bifurcation without stenosis or other  abnormality. Eccentric calcified plaque at the origin of the right ICA with up to 75% stenosis by NASCET criteria, relatively stable from previous. Multifocal irregularity within the distal cervical left ICA noted as well, which could be atheromatous related or possibly related to mild changes of FMD, also stable (series 7, image 178). No associated stenosis, dissection, or other complication. Left carotid system: Left CCA patent from its origin to the bifurcation without stenosis. Bulky calcified plaque at the origin of the left ICA with associated stenosis of up to 50% by NASCET criteria, stable from previous. Left ICA otherwise patent distally to the skull base without stenosis, dissection or occlusion. Vertebral arteries: Both vertebral arteries arise from the subclavian arteries. No proximal subclavian artery stenosis. Both vertebral arteries patent within the neck without stenosis, dissection or occlusion. Skeleton: No visible acute osseous abnormality. Advanced degenerative changes about the C1-2 articulation. Moderate multilevel cervical spondylosis noted elsewhere. Visualized osseous structures demonstrate a somewhat mottled appearance. Other neck: No other acute soft tissue abnormality within the neck. No mass lesion or adenopathy. Upper chest: Visualized upper chest demonstrates no acute finding. Partially visualized lungs are grossly clear. Review of the MIP images confirms the above findings CTA HEAD FINDINGS Anterior circulation: Petrous segments widely patent bilaterally. Mild atheromatous change within the carotid siphons without hemodynamically significant  stenosis. A1 segments widely patent. Normal anterior communicating artery complex. Anterior cerebral arteries widely patent bilaterally. Normal in stenosis or occlusion. Negative MCA bifurcations. No proximal MCA branch occlusion. Distal MCA branches well perfused. Posterior circulation: Vertebral arteries patent to the vertebrobasilar junction without stenosis. Patent left PICA. Right PICA not seen. Basilar patent to its distal aspect without stenosis. Superior cerebral arteries patent bilaterally. Both PCAs primarily supplied via the basilar well perfused or distal aspects. Venous sinuses: Grossly patent allowing for timing the contrast bolus. Anatomic variants: None significant.  No aneurysm. Review of the MIP images confirms the above findings CT Brain Perfusion Findings: ASPECTS: 10. CBF (<30%) Volume: 49mL Perfusion (Tmax>6.0s) volume: 58mL Mismatch Volume: 50mL Infarction Location:Negative CT perfusion for acute core infarct. Apparent 8 cc perfusion deficit favored to be artifactual, as there is extensive motion artifact on this exam. IMPRESSION: 1. Negative CTA for emergent large vessel occlusion. 2. Negative CT perfusion with no evidence for acute core infarct. No definite perfusion abnormality, although examination limited by motion. 3. Atheromatous change about the carotid bifurcations bilaterally, with associated stenoses of up to 75% on the right and 50% on the left, stable. Critical Value/emergent results were called by telephone at the time of interpretation on 08/19/2020 at approximately 10:25 pm to provider [facility] , who verbally acknowledged these results. Electronically Signed   By: Jeannine Boga M.D.   On: 08/19/2020 22:54   ECHOCARDIOGRAM COMPLETE  Result Date: 08/14/2020    ECHOCARDIOGRAM REPORT   Patient Name:   Shawna Lynch Date of Exam: 08/14/2020 Medical Rec #:  [id number]   Height:       66.0 in Accession #:    [id number]  Weight:       140.7 lb Date of Birth:  June 20, 1935    BSA:           1.722 m Patient Age:    58 years    BP:           154/82 mmHg Patient Gender: F           HR:           89 bpm. Exam Location:  Inpatient Procedure: 2D  Echo, Cardiac Doppler and Color Doppler Indications:    TIA 435.9 / G45.9  History:        Patient has prior history of Echocardiogram examinations, most                 recent 04/04/2017. Previous Myocardial Infarction and CAD, TIA;                 Risk Factors:Hypertension and Diabetes.  Sonographer:    Bernadene Person RDCS Referring Phys: [id number] [place]  1. Left ventricular ejection fraction, by estimation, is 60 to 65%. The left ventricle has normal function. The left ventricle has no regional wall motion abnormalities. There is mild asymmetric left ventricular hypertrophy of the basal-septal segment. Indeterminate diastolic filling due to E-A fusion.  2. Right ventricular systolic function is normal. The right ventricular size is normal. There is normal pulmonary artery systolic pressure.  3. The mitral valve is normal in structure. Mild mitral valve regurgitation. No evidence of mitral stenosis.  4. The aortic valve is grossly normal. There is moderate calcification of the aortic valve. Aortic valve regurgitation is mild. Mild aortic valve sclerosis is present, with no evidence of aortic valve stenosis.  5. The inferior vena cava is normal in size with greater than 50% respiratory variability, suggesting right atrial pressure of 3 mmHg. Conclusion(s)/Recommendation(s): No intracardiac source of embolism detected on this transthoracic study. A transesophageal echocardiogram is recommended to exclude cardiac source of embolism if clinically indicated. FINDINGS  Left Ventricle: Left ventricular ejection fraction, by estimation, is 60 to 65%. The left ventricle has normal function. The left ventricle has no regional wall motion abnormalities. The left ventricular internal cavity size was normal in size. There is  mild asymmetric left  ventricular hypertrophy of the basal-septal segment. Indeterminate diastolic filling due to E-A fusion. Right Ventricle: The right ventricular size is normal. No increase in right ventricular wall thickness. Right ventricular systolic function is normal. There is normal pulmonary artery systolic pressure. The tricuspid regurgitant velocity is 2.54 m/s, and  with an assumed right atrial pressure of 3 mmHg, the estimated right ventricular systolic pressure is 87.5 mmHg. Left Atrium: Left atrial size was normal in size. Right Atrium: Right atrial size was normal in size. Pericardium: There is no evidence of pericardial effusion. Mitral Valve: The mitral valve is normal in structure. Mild mitral valve regurgitation. No evidence of mitral valve stenosis. Tricuspid Valve: The tricuspid valve is normal in structure. Tricuspid valve regurgitation is trivial. No evidence of tricuspid stenosis. Aortic Valve: The aortic valve is grossly normal. There is moderate calcification of the aortic valve. Aortic valve regurgitation is mild. Aortic regurgitation PHT measures 413 msec. Mild aortic valve sclerosis is present, with no evidence of aortic valve stenosis. Pulmonic Valve: The pulmonic valve was grossly normal. Pulmonic valve regurgitation is not visualized. No evidence of pulmonic stenosis. Aorta: The aortic root is normal in size and structure. Venous: The inferior vena cava is normal in size with greater than 50% respiratory variability, suggesting right atrial pressure of 3 mmHg. IAS/Shunts: No atrial level shunt detected by color flow Doppler.  LEFT VENTRICLE PLAX 2D LVIDd:         4.41 cm  Diastology LVIDs:         2.74 cm  LV e' medial:  0.05 cm/s LV PW:         0.81 cm  LV e' lateral: 0.06 cm/s LV IVS:        1.28 cm LVOT  diam:     2.00 cm LV SV:         50 LV SV Index:   29 LVOT Area:     3.14 cm  RIGHT VENTRICLE RV S prime:     9.79 cm/s TAPSE (M-mode): 1.3 cm LEFT ATRIUM             Index       RIGHT ATRIUM           Index LA diam:        3.20 cm 1.86 cm/m  RA Area:     7.18 cm LA Vol (A2C):   55.9 ml 32.46 ml/m RA Volume:   8.51 ml  4.94 ml/m LA Vol (A4C):   46.7 ml 27.12 ml/m LA Biplane Vol: 57.1 ml 33.16 ml/m  AORTIC VALVE LVOT Vmax:   85.90 cm/s LVOT Vmean:  63.300 cm/s LVOT VTI:    0.160 m AI PHT:      413 msec  AORTA Ao Root diam: 3.10 cm Ao Asc diam:  3.35 cm TRICUSPID VALVE TR Peak grad:   25.8 mmHg TR Vmax:        254.00 cm/s  SHUNTS Systemic VTI:  0.16 m Systemic Diam: 2.00 cm Cherlynn Kaiser MD Electronically signed by Cherlynn Kaiser MD Signature Date/Time: 08/14/2020/12:51:57 PM    Final    CT HEAD CODE STROKE WO CONTRAST  Result Date: 08/19/2020 CLINICAL DATA:  Code stroke. Initial evaluation for acute right-sided facial droop. EXAM: CT HEAD WITHOUT CONTRAST TECHNIQUE: Contiguous axial images were obtained from the base of the skull through the vertex without intravenous contrast. COMPARISON:  Prior MRI and CTA from 08/14/2020. FINDINGS: Brain: Atrophy with chronic microvascular ischemic disease, stable. No acute intracranial hemorrhage. No acute large vessel territory infarct. No mass lesion, midline shift or mass effect. Ventricular prominence related to global parenchymal volume loss without hydrocephalus. No extra-axial fluid collection. Vascular: No hyperdense vessel. Calcified atherosclerosis present at skull base. Skull: Scalp soft tissues and calvarium within normal limits. Sinuses/Orbits: Globes and orbital soft tissues demonstrate no acute finding. Paranasal sinuses are clear. Trace bilateral mastoid effusions noted, of doubtful significance. Other: None. ASPECTS [facility] Stroke Program Early CT Score) - Ganglionic level infarction (caudate, lentiform nuclei, internal capsule, insula, M1-M3 cortex): 7 - Supraganglionic infarction (M4-M6 cortex): 3 Total score (0-10 with 10 being normal): 10 IMPRESSION: 1. No acute intracranial infarct or other abnormality. 2. ASPECTS is 10. 3. Atrophy with  chronic microvascular ischemic disease, stable. These results were communicated to Dr. Rory Percy at [place] 11/21/2021by text page via the [facility] messaging system. Electronically Signed   By: Jeannine Boga M.D.   On: 08/19/2020 22:19   CT HEAD CODE STROKE WO CONTRAST  Addendum Date: 08/13/2020   ADDENDUM REPORT: 08/13/2020 12:53 ADDENDUM: Findings discussed with Dr. Cheral Marker at 12:52 PM via telephone. Electronically Signed   By: Margaretha Sheffield MD   On: 08/13/2020 12:53   Result Date: 08/13/2020 CLINICAL DATA:  Code stroke. Neuro deficit, acute stroke suspected. Right-sided weakness and facial droop. EXAM: CT HEAD WITHOUT CONTRAST TECHNIQUE: Contiguous axial images were obtained from the base of the skull through the vertex without intravenous contrast. COMPARISON:  CT head August 18, 2017. FINDINGS: Brain: No evidence of acute large vascular territory infarction, hemorrhage, hydrocephalus, extra-axial collection or mass lesion/mass effect. There is similar patchy white matter hypoattenuation, which is nonspecific but most likely secondary to chronic microvascular ischemic disease. Similar generalized cerebral atrophy with ex vacuo ventricular dilation. Vascular: No hyperdense vessel  or unexpected calcification. Calcific atherosclerosis. Skull: No acute fracture. Sinuses/Orbits: No acute findings. Other: Small left mastoid effusion. ASPECTS [facility] Stroke Program Early CT Score) total score (0-10 with 10 being normal): 10. IMPRESSION: 1. No evidence of acute intracranial abnormality.  ASPECTS is 10. 2. Chronic microvascular ischemic disease and generalized atrophy. Dr. Cheral Marker was paged at the time of dictation. Electronically Signed: By: Margaretha Sheffield MD On: 08/13/2020 12:45      HISTORY OF PRESENT ILLNESS Shawna Lynch is a 85 y.o. female past medical history of dementia, coronary artery disease, diabetes, hypertension, presented to the emergency room for evaluation of sudden onset of  right-sided weakness which was resolved after a little while and then had sudden onset of left-sided weakness and neglect. The patient was recently seen for a left hemispheric TIA when she had presented on 08/14/2020 with right facial droop and right-sided weakness transient symptoms.  Stroke work-up completed and discharged home on aspirin and Brilinta.  Work-up revealed 75% right carotid stenosis and 50% left carotid stenosis. Today 08/19/2020, she was in her usual state of health till about last known well of 6 PM after which she had sudden onset of right facial droop and difficulty talking.  The daughter said that she could not bring her words out.  Also noted to be weak on the right and unable to walk.  This resolved after EMS arrival but they noted that she is weaker on the left side and also is having neglect on the left and double simultaneous stimulation. Patient at baseline has moderate dementia.  Requires assistance with ADLs 24/7.  In the daytime, has a daytime aide and at night the family checks in at regular intervals. Premorbid modified Rankin scale (mRS): 3-4 Code stroke was activated in the field and patient was brought in as an acute code stroke. With EMS, systolics between 329-924.  Fingerstick glucose in the 300s. Fingerstick glucose in the ED 238. Initial NIH stroke scale-9 consistent with a right hemispheric stroke. Risks and benefits for IV TPA discussed with the daughter over the phone and IV TPA administered.    HOSPITAL COURSE Ms. SHANYLA MARCONI is a 85 y.o. female with history of  DM, CAD s/p DES, HTN and prior TIA presenting with left-sided weakness and neglect with NIH stroke scale of 9 with concern for right hemispheric infarct and was treated with IV TPA with resolution of her symptoms.    Stroke like symptoms s/p tPA, MRI negative but symptoms remain, therefore dx R brain Stroke not seen on imaging Code Stroke CT head No acute abnormality. Small vessel disease. Atrophy.  ASPECTS 10.    CTA head & neck no LVO, B ICA bifurcation R 75%, L 50% CT perfusion negative MRI  No acute abnormality. Small vessel disease. Atrophy.  Repeat CT head neg for stroke   2D Echo EF 60-65%. No source of embolus  LDL 23 HgbA1c 8.9 aspirin 81 mg daily and Brilinta (ticagrelor) 90 mg bid prior to admission, now on No antithrombotic. Resume DAPT w/ brilinta and continue at d/c Therapy recommendations:  SNF Disposition:  return home - lives at home with daytime sitter, family making arrangements for hs sitter    R ICA stenosis  Possibly symptomatic, possible etiology of stroke R ICA 75% Not a surgical candidate given advanced dementia   Hypertension Home meds:  Coreg 25 mg BID + Imdur 60 mg  Stable Long-term BP goal normotensive   Hyperlipidemia Home meds:  lipitor 80 + omega  3 LDL 23, goal < 70 Resume lipitor at half dose given LDL Continue statin at discharge   Diabetes type II Uncontrolled Home meds:  Metformin 500 QD HgbA1c 8.9, goal < 7.0 PCP follow up   Other Stroke Risk Factors Advanced Age >/= 30  Former Cigarette smoker Coronary artery disease, NSTEMI 2018 Hx TIA   Other Active Problems Baseline dementia requiring daytime sitter PTA    DISCHARGE EXAM Blood pressure 107/62, pulse 88, temperature 97.6 F (36.4 C), temperature source Oral, resp. rate 18, weight 63.8 kg, SpO2 100 %.  Constitutional: Calm, appropriate for condition  Cardiovascular: Normal RRRespiratory: No increased WOB  Mental status: Oriented to self only, follows commands Speech: Fluent with repetition and naming intact, no dysarthria noted Cranial nerves: EOMI, VFF, but sensory inattention on the left.  Face symmetric, Tongue midline, Shoulder shrug intact  Motor: Normal bulk and tone. No drift. Sensory: Intact to light tough throughout sensory inattention on the left. Coordination: Intact FNF  Reflexes: Not assessed Gait: Deferred  Discharge Diet   Carb modified thin  liquids  DISCHARGE PLAN Disposition:  Home w/ family who has arranged daytime and nighttime sitters Continue aspirin 81 mg daily and Brilinta (ticagrelor) 90 mg bid for secondary stroke prevention, already on PTA Ongoing stroke risk factor control by Primary Care Physician at time of discharge Follow-up PCP Philmore Pali, NP in 2 weeks. Follow-up in [facility] in 4 weeks, office to schedule an appointment.   32 minutes were spent preparing discharge.  Burnetta Sabin, MSN, APRN, ANVP-BC, AGPCNP-BC Advanced Practice Stroke Nurse [place] for Schedule & Pager information 08/24/2020 11:49 AM   I have personally obtained history,examined this patient, reviewed notes, independently viewed imaging studies, participated in medical decision making and plan of care.ROS completed by me personally and pertinent positives fully documented  I have made any additions or clarifications directly to the above note. Agree with note above.   Antony Contras, MD Medical Director [facility] St[facility] Center Pager: [phone] 08/27/2020 12:44 PM

## 2020-08-24 NOTE — Progress Notes (Signed)
STROKE TEAM PROGRESS NOTE   INTERVAL HISTORY Patient is lying in bed comfortably.  She has no complaints.  No family at the bedside.  Plan to discharge home today family has made arrangements for 24-hour supervision at home.  Vital signs are stable.  Neurological exam unchanged.  Left-sided neglect is improved but she is not very consistent with her responses.  Vitals:   08/23/20 1616 08/24/20 0020 08/24/20 0348 08/24/20 0734  BP: 128/73 120/63 135/72 (!) 149/86  Pulse: 81  80 89  Resp: 19 15  18   Temp: (!) 97.4 F (36.3 C) 98.1 F (36.7 C) (!) 97.5 F (36.4 C) 98.1 F (36.7 C)  TempSrc: Axillary Axillary Axillary Oral  SpO2: 94% 97% 99% 100%  Weight:       CBC:  Recent Labs  Lab 08/19/20 2257 08/19/20 2257 08/19/20 2308 08/22/20 0733  WBC 8.8  --   --  6.0  NEUTROABS 6.1  --   --   --   HGB 11.8*   < > 11.9* 12.2  HCT 36.7   < > 35.0* 36.4  MCV 92.0  --   --  90.3  PLT 190  --   --  186   < > = values in this interval not displayed.   Basic Metabolic Panel:  Recent Labs  Lab 08/19/20 2257 08/19/20 2257 08/19/20 2308 08/22/20 0733  NA 133*   < > 134* 139  K 3.7   < > 3.6 3.4*  CL 96*   < > 96* 103  CO2 24  --   --  23  GLUCOSE 244*   < > 237* 228*  BUN 21   < > 23 13  CREATININE 1.37*   < > 1.30* 1.09*  CALCIUM 9.7  --   --  8.8*   < > = values in this interval not displayed.   Lab Results  Component Value Date   CHOL 109 08/13/2020   HDL 44 08/13/2020   LDLCALC 23 08/13/2020   TRIG 211 (H) 08/13/2020   CHOLHDL 2.5 08/13/2020   Lab Results  Component Value Date   HGBA1C 8.9 (H) 08/13/2020    Urine Drug Screen: No results for input(s): LABOPIA, COCAINSCRNUR, LABBENZ, AMPHETMU, THCU, LABBARB in the last 168 hours.  Alcohol Level No results for input(s): ETH in the last 168 hours.  IMAGING past 24 hours No results found.  PHYSICAL EXAM      Constitutional: Calm, appropriate for condition  Cardiovascular: Normal RR Respiratory: No increased WOB    Mental status: Oriented to self only, follows commands  Speech: Fluent with repetition and naming intact, no dysarthria noted Cranial nerves: EOMI, VFF, but sensory inattention on the left.  Face symmetric, Tongue midline, Shoulder shrug intact  Motor: Normal bulk and tone. No drift.   Dlt Bic Tri FgS Grp HF  KnF KnE PIF DoF  R 5 5 5 5 5 5 5 5 5 5   L 5 5 5 5 5 5 5 5 5 5   Sensory: Intact to light tough throughout sensory inattention on the left. Coordination: Intact FNF  Reflexes: Not assessed Gait: Deferred      ASSESSMENT/PLAN Ms. ABBI MANCINI is a 85 y.o. female with history of  DM, CAD s/p DES, HTN and prior TIA presenting with left-sided weakness and neglect with NIH stroke scale of 9 with concern for right hemispheric infarct and was treated with IV TPA with resolution of her symptoms.   Stroke like symptoms  s/p tPA, MRI negative but symptoms remain, therefore dx R brain Stroke not seen on imaging  Code Stroke CT head No acute abnormality. Small vessel disease. Atrophy. ASPECTS 10.     CTA head & neck no LVO, B ICA bifurcation R 75%, L 50%  CT perfusion negative  MRI  No acute abnormality. Small vessel disease. Atrophy.   Repeat CT head neg for stroke    2D Echo EF 60-65%. No source of embolus   LDL 23  HgbA1c 8.9  VTE prophylaxis - SCDs -> Lovenox 40 mg sq daily    aspirin 81 mg daily and Brilinta (ticagrelor) 90 mg bid prior to admission, now on No antithrombotic. Resume DAPT w/ brilinta and continue at d/c  Therapy recommendations:  SNF  Disposition:  pending - lives at home with daytime sitter, family making arrangements for hs sitter and possible d/c today.  If SNF needed, they prefer [facility] and have contacted them - possible bed first of the week  R ICA stenosis   Possibly symptomatic, possible etiology of stroke  R ICA 75%  Not a surgical candidate given advanced dementia  Hypertension  Home meds:  Coreg 25 mg BID + Imdur 60 mg    Stable . Long-term BP goal normotensive  Hyperlipidemia  Home meds:  lipitor 80 + omega 3  LDL 23, goal < 70  Resume lipitor at half dose given LDL  Continue statin at discharge  Diabetes type II Uncontrolled  Home meds:  Metformin 500 QD  HgbA1c 8.9, goal < 7.0  CBGs Recent Labs    08/23/20 1620 08/23/20 2111 08/24/20 0621  GLUCAP 208* 164* 169*      SSI  Other Stroke Risk Factors  Advanced Age >/= 74   Former Cigarette smoker  Coronary artery disease, NSTEMI 2018  Hx TIA  Other Active Problems  Baseline dementia requiring daytime sitter PTA  IVF to 50cc/hr. Check labs.  Hospital day # 5  Patient is medically stable to be discharged home if family is agreeable and have arranged for 24-hour care. Antony Contras, MD  To contact Stroke Continuity provider, please refer to [email or web address] After hours, contact General Neurology

## 2020-08-24 NOTE — TOC Transition Note (Signed)
Transition of Care [facility]) - CM/SW Discharge Note   Patient Details  Name: JERMAINE THOLL MRN: [id number] Date of Birth: Aug 22, 1935  [facility] of [facility]) CM/SW Contact:  Pollie Friar, RN Phone Number: 08/24/2020, 12:50 PM   Clinical Narrative:    Pt discharging home with [facility] services through [place]. Cory with [facility] accepted the referral.  Pt has all needed DME at home per daughter.  Family able to provide needed supervision at home and transport to home.   Final next level of care: Home w Home Health Services Barriers to Discharge: No Barriers Identified   Patient Goals and CMS Choice   CMS Medicare.gov Compare Post Acute Care list provided to:: Patient Represenative (must comment) Choice offered to / list presented to : Adult Children  Discharge Placement                       Discharge Plan and Services   Discharge Planning Services: CM Consult Post Acute Care Choice: Skilled Nursing Facility                    HH Arranged: PT, OT [facility] Agency: [facility] Date [facility] Agency Contacted: 08/24/20   Representative spoke with at [place]: [place] ([place]) Interventions     Readmission Risk Interventions No flowsheet data found.

## 2020-08-24 NOTE — Plan of Care (Signed)
Adequate for discharge.

## 2020-08-28 DIAGNOSIS — I69391 Dysphagia following cerebral infarction: Secondary | ICD-10-CM | POA: Diagnosis not present

## 2020-08-28 DIAGNOSIS — I69320 Aphasia following cerebral infarction: Secondary | ICD-10-CM | POA: Diagnosis not present

## 2020-08-28 DIAGNOSIS — I1310 Hypertensive heart and chronic kidney disease without heart failure, with stage 1 through stage 4 chronic kidney disease, or unspecified chronic kidney disease: Secondary | ICD-10-CM | POA: Diagnosis not present

## 2020-08-28 DIAGNOSIS — F0280 Dementia in other diseases classified elsewhere without behavioral disturbance: Secondary | ICD-10-CM | POA: Diagnosis not present

## 2020-08-28 DIAGNOSIS — I69322 Dysarthria following cerebral infarction: Secondary | ICD-10-CM | POA: Diagnosis not present

## 2020-08-28 DIAGNOSIS — E1122 Type 2 diabetes mellitus with diabetic chronic kidney disease: Secondary | ICD-10-CM | POA: Diagnosis not present

## 2020-08-28 DIAGNOSIS — E1165 Type 2 diabetes mellitus with hyperglycemia: Secondary | ICD-10-CM | POA: Diagnosis not present

## 2020-08-28 DIAGNOSIS — I69354 Hemiplegia and hemiparesis following cerebral infarction affecting left non-dominant side: Secondary | ICD-10-CM | POA: Diagnosis not present

## 2020-08-28 DIAGNOSIS — G319 Degenerative disease of nervous system, unspecified: Secondary | ICD-10-CM | POA: Diagnosis not present

## 2020-08-30 ENCOUNTER — Other Ambulatory Visit: Payer: Self-pay

## 2020-08-30 NOTE — Patient Outreach (Signed)
[place] [facility]) Care Management  08/30/2020  SHLEY DOLBY 1935/01/26 [id number]   EMMI- General Discharge RED ON EMMI ALERT Day # 4 Date: 08/29/20 Red Alert Reason: Got Discharge papers? I don't know.    Outreach attempt:spoke with patient. She reports she is doing good at home. She states she has caregivers in the home with her all the time. Patient states that her daughter helps her too.  Discussed red alert. Patient states her daughter has her discharge papers and handles things.  Discussed [facility] further follow up support. Patient declined at this time.  Plan: RN CM will close case.    Jone Baseman, RN, MSN [facility] Care Management Care Management Coordinator Direct Line [phone] Toll Free: [phone]  Fax: [phone]

## 2020-08-31 DIAGNOSIS — I69320 Aphasia following cerebral infarction: Secondary | ICD-10-CM | POA: Diagnosis not present

## 2020-08-31 DIAGNOSIS — G319 Degenerative disease of nervous system, unspecified: Secondary | ICD-10-CM | POA: Diagnosis not present

## 2020-08-31 DIAGNOSIS — E1122 Type 2 diabetes mellitus with diabetic chronic kidney disease: Secondary | ICD-10-CM | POA: Diagnosis not present

## 2020-08-31 DIAGNOSIS — I69322 Dysarthria following cerebral infarction: Secondary | ICD-10-CM | POA: Diagnosis not present

## 2020-08-31 DIAGNOSIS — F0280 Dementia in other diseases classified elsewhere without behavioral disturbance: Secondary | ICD-10-CM | POA: Diagnosis not present

## 2020-08-31 DIAGNOSIS — E1165 Type 2 diabetes mellitus with hyperglycemia: Secondary | ICD-10-CM | POA: Diagnosis not present

## 2020-08-31 DIAGNOSIS — I69354 Hemiplegia and hemiparesis following cerebral infarction affecting left non-dominant side: Secondary | ICD-10-CM | POA: Diagnosis not present

## 2020-08-31 DIAGNOSIS — I69391 Dysphagia following cerebral infarction: Secondary | ICD-10-CM | POA: Diagnosis not present

## 2020-08-31 DIAGNOSIS — I1310 Hypertensive heart and chronic kidney disease without heart failure, with stage 1 through stage 4 chronic kidney disease, or unspecified chronic kidney disease: Secondary | ICD-10-CM | POA: Diagnosis not present

## 2020-09-03 DIAGNOSIS — G319 Degenerative disease of nervous system, unspecified: Secondary | ICD-10-CM | POA: Diagnosis not present

## 2020-09-03 DIAGNOSIS — E1122 Type 2 diabetes mellitus with diabetic chronic kidney disease: Secondary | ICD-10-CM | POA: Diagnosis not present

## 2020-09-03 DIAGNOSIS — E1165 Type 2 diabetes mellitus with hyperglycemia: Secondary | ICD-10-CM | POA: Diagnosis not present

## 2020-09-03 DIAGNOSIS — I69320 Aphasia following cerebral infarction: Secondary | ICD-10-CM | POA: Diagnosis not present

## 2020-09-03 DIAGNOSIS — I69322 Dysarthria following cerebral infarction: Secondary | ICD-10-CM | POA: Diagnosis not present

## 2020-09-03 DIAGNOSIS — I1310 Hypertensive heart and chronic kidney disease without heart failure, with stage 1 through stage 4 chronic kidney disease, or unspecified chronic kidney disease: Secondary | ICD-10-CM | POA: Diagnosis not present

## 2020-09-03 DIAGNOSIS — I69391 Dysphagia following cerebral infarction: Secondary | ICD-10-CM | POA: Diagnosis not present

## 2020-09-03 DIAGNOSIS — I69354 Hemiplegia and hemiparesis following cerebral infarction affecting left non-dominant side: Secondary | ICD-10-CM | POA: Diagnosis not present

## 2020-09-03 DIAGNOSIS — F0280 Dementia in other diseases classified elsewhere without behavioral disturbance: Secondary | ICD-10-CM | POA: Diagnosis not present

## 2020-09-04 DIAGNOSIS — Z8673 Personal history of transient ischemic attack (TIA), and cerebral infarction without residual deficits: Secondary | ICD-10-CM | POA: Diagnosis not present

## 2020-09-04 DIAGNOSIS — Z79899 Other long term (current) drug therapy: Secondary | ICD-10-CM | POA: Diagnosis not present

## 2020-09-04 DIAGNOSIS — N1832 Chronic kidney disease, stage 3b: Secondary | ICD-10-CM | POA: Diagnosis not present

## 2020-09-04 DIAGNOSIS — G319 Degenerative disease of nervous system, unspecified: Secondary | ICD-10-CM | POA: Diagnosis not present

## 2020-09-04 DIAGNOSIS — Z794 Long term (current) use of insulin: Secondary | ICD-10-CM | POA: Diagnosis not present

## 2020-09-04 DIAGNOSIS — Z09 Encounter for follow-up examination after completed treatment for conditions other than malignant neoplasm: Secondary | ICD-10-CM | POA: Diagnosis not present

## 2020-09-04 DIAGNOSIS — F0280 Dementia in other diseases classified elsewhere without behavioral disturbance: Secondary | ICD-10-CM | POA: Diagnosis not present

## 2020-09-04 DIAGNOSIS — Z6823 Body mass index (BMI) 23.0-23.9, adult: Secondary | ICD-10-CM | POA: Diagnosis not present

## 2020-09-04 DIAGNOSIS — E1165 Type 2 diabetes mellitus with hyperglycemia: Secondary | ICD-10-CM | POA: Diagnosis not present

## 2020-09-06 DIAGNOSIS — G319 Degenerative disease of nervous system, unspecified: Secondary | ICD-10-CM | POA: Diagnosis not present

## 2020-09-06 DIAGNOSIS — I69354 Hemiplegia and hemiparesis following cerebral infarction affecting left non-dominant side: Secondary | ICD-10-CM | POA: Diagnosis not present

## 2020-09-06 DIAGNOSIS — I69320 Aphasia following cerebral infarction: Secondary | ICD-10-CM | POA: Diagnosis not present

## 2020-09-06 DIAGNOSIS — I1310 Hypertensive heart and chronic kidney disease without heart failure, with stage 1 through stage 4 chronic kidney disease, or unspecified chronic kidney disease: Secondary | ICD-10-CM | POA: Diagnosis not present

## 2020-09-06 DIAGNOSIS — E1165 Type 2 diabetes mellitus with hyperglycemia: Secondary | ICD-10-CM | POA: Diagnosis not present

## 2020-09-06 DIAGNOSIS — I69391 Dysphagia following cerebral infarction: Secondary | ICD-10-CM | POA: Diagnosis not present

## 2020-09-06 DIAGNOSIS — I69322 Dysarthria following cerebral infarction: Secondary | ICD-10-CM | POA: Diagnosis not present

## 2020-09-06 DIAGNOSIS — F0280 Dementia in other diseases classified elsewhere without behavioral disturbance: Secondary | ICD-10-CM | POA: Diagnosis not present

## 2020-09-06 DIAGNOSIS — E1122 Type 2 diabetes mellitus with diabetic chronic kidney disease: Secondary | ICD-10-CM | POA: Diagnosis not present

## 2020-09-10 DIAGNOSIS — I69354 Hemiplegia and hemiparesis following cerebral infarction affecting left non-dominant side: Secondary | ICD-10-CM | POA: Diagnosis not present

## 2020-09-10 DIAGNOSIS — I1310 Hypertensive heart and chronic kidney disease without heart failure, with stage 1 through stage 4 chronic kidney disease, or unspecified chronic kidney disease: Secondary | ICD-10-CM | POA: Diagnosis not present

## 2020-09-10 DIAGNOSIS — I69322 Dysarthria following cerebral infarction: Secondary | ICD-10-CM | POA: Diagnosis not present

## 2020-09-10 DIAGNOSIS — G319 Degenerative disease of nervous system, unspecified: Secondary | ICD-10-CM | POA: Diagnosis not present

## 2020-09-10 DIAGNOSIS — F0280 Dementia in other diseases classified elsewhere without behavioral disturbance: Secondary | ICD-10-CM | POA: Diagnosis not present

## 2020-09-10 DIAGNOSIS — E1122 Type 2 diabetes mellitus with diabetic chronic kidney disease: Secondary | ICD-10-CM | POA: Diagnosis not present

## 2020-09-10 DIAGNOSIS — I69320 Aphasia following cerebral infarction: Secondary | ICD-10-CM | POA: Diagnosis not present

## 2020-09-10 DIAGNOSIS — I69391 Dysphagia following cerebral infarction: Secondary | ICD-10-CM | POA: Diagnosis not present

## 2020-09-10 DIAGNOSIS — E1165 Type 2 diabetes mellitus with hyperglycemia: Secondary | ICD-10-CM | POA: Diagnosis not present

## 2020-09-12 ENCOUNTER — Ambulatory Visit: Payer: Medicare HMO | Admitting: Neurology

## 2020-09-12 ENCOUNTER — Encounter: Payer: Self-pay | Admitting: Neurology

## 2020-09-12 VITALS — BP 142/84 | HR 90 | Ht 66.0 in | Wt 134.5 lb

## 2020-09-12 DIAGNOSIS — F0150 Vascular dementia without behavioral disturbance: Secondary | ICD-10-CM | POA: Diagnosis not present

## 2020-09-12 DIAGNOSIS — F32A Depression, unspecified: Secondary | ICD-10-CM

## 2020-09-12 MED ORDER — DIVALPROEX SODIUM 250 MG PO DR TAB: 250.0000 mg | DELAYED_RELEASE_TABLET | Freq: Two times a day (BID) | ORAL | 11 refills | Status: AC

## 2020-09-12 NOTE — Progress Notes (Signed)
HISTORY OF PRESENT ILLNESS: Shawna Lysne Wrightis a 85 years old female, accompanied by her daughter Shawna Lynch, seen in refer by her primary care PA for evaluation of memory loss, headache, initial evaluation was on September 03, 2017.  I reviewed and summarized the referring note, she has past medical history of hypertension, diabetes, hyperlipidemia, coronary artery disease, memory loss, taking Namendaxr28 mg, donepezil 10 mg, since March 2018,  She retired from Psychologist, educational job, lives across the street from her daughter by herself, she was noted to have gradual onset memory loss since 2017, no longer able to attend her weekly get together with her friends regularly, her daughter has to take over financial managment since 2017,  She also reported a history of long history of headaches, now she has different kind of headaches, pressure, symmetric, no significant light noise sensitivity,  She was started on Aricept and Namenda March 2017, tolerating the medication well, there was no significant improvement, he is also taking Zoloft 100 mg a day for depression,  Laboratory evaluation in 2018, CBC mild anemia hemoglobin of 11, BMP showed elevated creatinine 1.59  Personally reviewed CT head in November 2018, MRI of the brain 2017, generalized atrophy, supratentorium small vessel disease.  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, scratching her scalp during the interview, to the point of developing woundher skull,   UPDATE Sep 12 2020: She presented to hospital twice in November 2021 for transient confusion, focal deficit, First episode was on August 13, 2020, she presented with transient right arm and face numbness, weakness, increased language difficulty, I personally reviewed the films MRI of the brain showed no acute abnormality  CT  angiogram of the brain and neck right internal carotid artery 75% stenosis, 50% on the left side,  Echocardiogram, ejection fraction 60 to 65%  EEG showed intermittent generalized and, maximum left temporal 3 to 5 Hz theta delta slowing,  Hospital admission again on August 19, 2020, sudden onset right facial droop, difficulty talking, weak on the right side, increased gait abnormality  She received IV TPA,  Repeat MRI of the brain showed no acute abnormality, A1c was 8.9, LDL was 23  REVIEW OF SYSTEMS: Out of a complete 14 system review of symptoms, the patient complains only of the following symptoms, and all other reviewed systems are negative.   ALLERGIES: Allergies  Allergen Reactions  . Penicillins Hives  . Sulfa Antibiotics Rash  . Vicodin [Hydrocodone-Acetaminophen] Nausea And Vomiting  . Codeine Rash    HOME MEDICATIONS: Outpatient Medications Prior to Visit  Medication Sig Dispense Refill  . aspirin EC 81 MG EC tablet Take 1 tablet (81 mg total) by mouth daily. 30 tablet   . atorvastatin (LIPITOR) 80 MG tablet Take 0.5 tablets (40 mg total) by mouth daily at 6 PM. 90 tablet 1  . carvedilol (COREG) 25 MG tablet Take 12.5 mg by mouth 2 (two) times daily with a meal.    . cholecalciferol (VITAMIN D) 1000 units tablet Take 1,000 Units by mouth daily.    [name] escitalopram (LEXAPRO) 10 MG tablet Take 10 mg by mouth daily.    [name] LANTUS SOLOSTAR 100 UNIT/ML Solostar Pen Inject 14 Units into the skin at bedtime.    . metFORMIN (GLUCOPHAGE) 500 MG tablet Take 500 mg by mouth daily.     . nitroGLYCERIN (NITROSTAT) 0.4 MG SL tablet Place 1 tablet (0.4 mg  total) under the tongue every 5 (five) minutes x 3 doses as needed for chest pain. (Patient taking differently: Place 0.4 mg under the tongue every 5 (five) minutes as needed for chest pain (max 3 doses).) 25 tablet 2  . omega-3 acid ethyl esters (LOVAZA) 1 g capsule Take 1 capsule (1 g total) by mouth 2 (two) times daily. 180 capsule  3  . ticagrelor (BRILINTA) 90 MG TABS tablet Take 1 tablet (90 mg total) by mouth 2 (two) times daily. 60 tablet 0  . vitamin B-12 (CYANOCOBALAMIN) 1000 MCG tablet Take 1,000 mcg by mouth daily.     No facility-administered medications prior to visit.    PAST MEDICAL HISTORY: Past Medical History:  Diagnosis Date  . Anxiety   . Arthritis    oa  . Coronary artery disease    04/06/17 PCI with DES to [facility], 90% in diag from LAD, nonobstructive disease in LAD, RCA. Normal EF.   [name] Dementia ([place])   . Depression   . Diabetes mellitus without complication ([place])   . Hypertension   . NSTEMI (non-ST elevated myocardial infarction) ([place])   . Ringing in ears   . TIA (transient ischemic attack)     PAST SURGICAL HISTORY: Past Surgical History:  Procedure Laterality Date  . ABDOMINAL HYSTERECTOMY    . CORONARY STENT INTERVENTION  04/06/2017  . CORONARY STENT INTERVENTION N/A 04/06/2017   Procedure: Coronary Stent Intervention;  Surgeon: Sherren Mocha, MD;  Location: [place] CV LAB;  Service: Cardiovascular;  Laterality: N/A;  . LEFT HEART CATH AND CORONARY ANGIOGRAPHY N/A 04/06/2017   Procedure: Left Heart Cath and Coronary Angiography;  Surgeon: Sherren Mocha, MD;  Location: [place] CV LAB;  Service: Cardiovascular;  Laterality: N/A;    FAMILY HISTORY: Family History  Problem Relation Age of Onset  . CAD Father   . Cancer Sister   . Hearing loss Mother   . Hypertension Mother     SOCIAL HISTORY: Social History   Socioeconomic History  . Marital status: Married    Spouse name: Not on file  . Number of children: 2  . Years of education: 47  . Highest education level: High school graduate  Occupational History  . Occupation: Retired  Tobacco Use  . Smoking status: Former Research scientist (life sciences)  . Smokeless tobacco: Never Used  . Tobacco comment: quit "20 years ago"  Vaping Use  . Vaping Use: Never used  Substance and Sexual Activity  . Alcohol use: No  . Drug use: No  . Sexual  activity: Not on file  Other Topics Concern  . Not on file  Social History Narrative   Lives alone but daughter is right across the street.   Right-handed.   No caffeine use.   Social Determinants of Health   Financial Resource Strain: Not on file  Food Insecurity: Not on file  Transportation Needs: Not on file  Physical Activity: Not on file  Stress: Not on file  Social Connections: Not on file  Intimate Partner Violence: Not on file   PHYSICAL EXAM  Vitals:   09/12/20 0839  BP: (!) 142/84  Pulse: 90  Weight: 134 lb 8 oz (61 kg)  Height: _0  (1.676 m)   Body mass index is 21.71 kg/m.  Generalized: Well developed, in no acute distress  MMSE - Mini Mental State Exam 09/12/2020 04/17/2020 10/13/2019  Not completed: - - (No Data)  Orientation to time 0 1 0  Orientation to Place _1 Registration  _0 Attention/ Calculation 0 0 0  Recall 0 0 0  Language- name 2 objects _1 Language- repeat 1 1 0  Language- repeat-comments - - she said ifs ands or buts  Language- follow 3 step command _2 Language- follow 3 step command-comments - - -  Language- read & follow direction _3 Write a sentence _4 Copy design 0 0 1  Copy design-comments - - -  Total score _5 CRANIAL NERVES: CN II: Visual fields are full to confrontation.  Pupils are round equal and briskly reactive to light. CN III, IV, VI: extraocular movement are normal. No ptosis. CN V: Facial sensation is intact to light touch. CN VII: Face is symmetric with normal eye closure and smile. CN VIII: Hearing is normal to casual conversation CN IX, X: Palate elevates symmetrically. Phonation is normal. CN XI: Head turning and shoulder shrug are intact CN XII: Tongue is midline with normal movements and no atrophy.  MOTOR: Muscle bulk and tone are normal. Muscle strength is normal.  REFLEXES: Reflexes are 2  and symmetric at the biceps, triceps, knees and ankles. Plantar responses are  flexor.  SENSORY: Intact to light touch, pinprick, positional and vibratory sensation at fingers and toes.  COORDINATION: There is no trunk or limb ataxia.    GAIT/STANCE: Need push-up to get up from seated position, wide-based, mildly unsteady  DIAGNOSTIC DATA (LABS, IMAGING, TESTING) - I reviewed patient records, labs, notes, testing and imaging myself where available.  Lab Results  Component Value Date   WBC 6.0 08/22/2020   HGB 12.2 08/22/2020   HCT 36.4 08/22/2020   MCV 90.3 08/22/2020   PLT 186 08/22/2020      Component Value Date/Time   NA 139 08/22/2020 0733   NA 139 12/17/2012 1305   K 3.4 (L) 08/22/2020 0733   K 4.0 12/17/2012 1305   CL 103 08/22/2020 0733   CL 102 12/17/2012 1305   CO2 23 08/22/2020 0733   CO2 29 12/17/2012 1305   GLUCOSE 228 (H) 08/22/2020 0733   GLUCOSE 151 (H) 12/17/2012 1305   BUN 13 08/22/2020 0733   BUN 15 12/17/2012 1305   CREATININE 1.09 (H) 08/22/2020 0733   CREATININE 1.00 12/17/2012 1305   CALCIUM 8.8 (L) 08/22/2020 0733   CALCIUM 9.2 12/17/2012 1305   PROT 6.1 (L) 08/19/2020 2257   PROT 8.1 12/17/2012 1305   ALBUMIN 3.1 (L) 08/19/2020 2257   ALBUMIN 3.7 12/17/2012 1305   AST 21 08/19/2020 2257   AST 23 12/17/2012 1305   ALT 24 08/19/2020 2257   ALT 17 12/17/2012 1305   ALKPHOS 42 08/19/2020 2257   ALKPHOS 54 12/17/2012 1305   BILITOT 1.0 08/19/2020 2257   BILITOT 0.4 12/17/2012 1305   GFRNONAA 50 (L) 08/22/2020 0733   GFRNONAA 55 (L) 12/17/2012 1305   GFRAA 34 (L) 08/18/2017 0852   GFRAA >60 12/17/2012 1305   Lab Results  Component Value Date   CHOL 109 08/13/2020   HDL 44 08/13/2020   LDLCALC 23 08/13/2020   TRIG 211 (H) 08/13/2020   CHOLHDL 2.5 08/13/2020   Lab Results  Component Value Date   HGBA1C 8.9 (H) 08/13/2020   Lab Results  Component Value Date   [id number] 09/03/2017   Lab Results  Component Value Date   TSH 2.510 09/03/2017    ASSESSMENT AND PLAN 85 y.o. year old female  Worsening dementia with evening time agitations  Mini-Mental Status Examination 15 out of 30,  MRI of the brain showed significant atrophy, also moderate supratentorium small vessel disease, memory loss, likely combination of central nervous system degenerative disorder with superimposed vascular component  Her diabetes under suboptimal control, also has vascular risk factor of hyperlipidemia, hypertension  Continue aspirin 81 mg daily  Recurrent episode of transient worsening confusion, language difficulty  MRI of the brain showed no acute abnormality,  Differentiation diagnosis including TIA versus partial seizure  EEG  Because of her evening time agitations, will try to empirically treat with Depakote DR 250 mg twice a day   Marcial Pacas, M.D. Ph.D.  [facility] [place], [place] [address] Phone: [phone] Fax:      [phone]

## 2020-09-13 ENCOUNTER — Inpatient Hospital Stay: Payer: Medicare HMO | Admitting: Neurology

## 2020-09-18 ENCOUNTER — Other Ambulatory Visit: Payer: Medicare HMO

## 2020-09-18 ENCOUNTER — Encounter: Payer: Self-pay | Admitting: *Deleted

## 2020-09-18 ENCOUNTER — Other Ambulatory Visit: Payer: Self-pay

## 2020-09-18 DIAGNOSIS — F0150 Vascular dementia without behavioral disturbance: Secondary | ICD-10-CM | POA: Diagnosis not present

## 2020-09-18 DIAGNOSIS — E119 Type 2 diabetes mellitus without complications: Secondary | ICD-10-CM | POA: Insufficient documentation

## 2020-09-18 DIAGNOSIS — Z794 Long term (current) use of insulin: Secondary | ICD-10-CM | POA: Diagnosis not present

## 2020-09-18 DIAGNOSIS — Z79899 Other long term (current) drug therapy: Secondary | ICD-10-CM | POA: Insufficient documentation

## 2020-09-18 DIAGNOSIS — Z7984 Long term (current) use of oral hypoglycemic drugs: Secondary | ICD-10-CM | POA: Diagnosis not present

## 2020-09-18 DIAGNOSIS — I10 Essential (primary) hypertension: Secondary | ICD-10-CM | POA: Insufficient documentation

## 2020-09-18 DIAGNOSIS — N390 Urinary tract infection, site not specified: Secondary | ICD-10-CM | POA: Insufficient documentation

## 2020-09-18 DIAGNOSIS — R112 Nausea with vomiting, unspecified: Secondary | ICD-10-CM | POA: Diagnosis not present

## 2020-09-18 DIAGNOSIS — Z7982 Long term (current) use of aspirin: Secondary | ICD-10-CM | POA: Diagnosis not present

## 2020-09-18 DIAGNOSIS — Z87891 Personal history of nicotine dependence: Secondary | ICD-10-CM | POA: Insufficient documentation

## 2020-09-18 DIAGNOSIS — Z6823 Body mass index (BMI) 23.0-23.9, adult: Secondary | ICD-10-CM | POA: Diagnosis not present

## 2020-09-18 DIAGNOSIS — E860 Dehydration: Secondary | ICD-10-CM | POA: Insufficient documentation

## 2020-09-18 DIAGNOSIS — I2510 Atherosclerotic heart disease of native coronary artery without angina pectoris: Secondary | ICD-10-CM | POA: Insufficient documentation

## 2020-09-18 LAB — CBC
HCT: 38.7 % (ref 36.0–46.0)
Hemoglobin: 12.8 g/dL (ref 12.0–15.0)
MCH: 29.8 pg (ref 26.0–34.0)
MCHC: 33.1 g/dL (ref 30.0–36.0)
MCV: 90.2 fL (ref 80.0–100.0)
Platelets: 216 10*3/uL (ref 150–400)
RBC: 4.29 MIL/uL (ref 3.87–5.11)
RDW: 12.8 % (ref 11.5–15.5)
WBC: 9 10*3/uL (ref 4.0–10.5)
nRBC: 0 % (ref 0.0–0.2)

## 2020-09-18 LAB — COMPREHENSIVE METABOLIC PANEL
ALT: 25 U/L (ref 0–44)
AST: 32 U/L (ref 15–41)
Albumin: 3.9 g/dL (ref 3.5–5.0)
Alkaline Phosphatase: 46 U/L (ref 38–126)
Anion gap: 15 (ref 5–15)
BUN: 23 mg/dL (ref 8–23)
CO2: 24 mmol/L (ref 22–32)
Calcium: 9.2 mg/dL (ref 8.9–10.3)
Chloride: 100 mmol/L (ref 98–111)
Creatinine, Ser: 1.05 mg/dL — ABNORMAL HIGH (ref 0.44–1.00)
GFR, Estimated: 53 mL/min — ABNORMAL LOW (ref >60)
Glucose, Bld: 151 mg/dL — ABNORMAL HIGH (ref 70–99)
Potassium: 3.7 mmol/L (ref 3.5–5.1)
Sodium: 139 mmol/L (ref 135–145)
Total Bilirubin: 1.3 mg/dL — ABNORMAL HIGH (ref 0.3–1.2)
Total Protein: 7.6 g/dL (ref 6.5–8.1)

## 2020-09-18 LAB — LIPASE, BLOOD: Lipase: 30 U/L (ref 11–51)

## 2020-09-18 NOTE — ED Triage Notes (Signed)
Pt to ED with family who report pt was dx with a UTI today at PCP. Pt was prescribed PO antibiotics and told by PCP that she should come to the hospital for dehydration. Per family pt has been vomiting since yesterday as well and not eating or drinking like normal. Dementia at baseline. No fevers.   PCP checked urine but not blood work.

## 2020-09-19 ENCOUNTER — Emergency Department
Admission: EM | Admit: 2020-09-19 | Discharge: 2020-09-19 | Disposition: A | Discharge status: Home / Self Care | Payer: Medicare HMO | Attending: Emergency Medicine | Admitting: Emergency Medicine

## 2020-09-19 DIAGNOSIS — N390 Urinary tract infection, site not specified: Secondary | ICD-10-CM

## 2020-09-19 DIAGNOSIS — R112 Nausea with vomiting, unspecified: Secondary | ICD-10-CM

## 2020-09-19 LAB — URINALYSIS, COMPLETE (UACMP) WITH MICROSCOPIC
Bilirubin Urine: NEGATIVE
Glucose, UA: NEGATIVE mg/dL
Hgb urine dipstick: NEGATIVE
Ketones, ur: 80 mg/dL — AB
Nitrite: NEGATIVE
Protein, ur: 30 mg/dL — AB
Specific Gravity, Urine: 1.02 (ref 1.005–1.030)
pH: 5 (ref 5.0–8.0)

## 2020-09-19 LAB — CBG MONITORING, ED: Glucose-Capillary: 117 mg/dL — ABNORMAL HIGH (ref 70–99)

## 2020-09-19 MED ORDER — FOSFOMYCIN TROMETHAMINE 3 G PO PACK
3.0000 g | PACK | Freq: Once | ORAL | Status: AC
  Administered 2020-09-19: 04:00:00 3 g via ORAL
  Filled 2020-09-19: qty 3

## 2020-09-19 MED ORDER — ONDANSETRON HCL 4 MG PO TABS: 4.0000 mg | ORAL_TABLET | Freq: Every day | ORAL | 0 refills | Status: DC | PRN

## 2020-09-19 MED ORDER — SODIUM CHLORIDE 0.9 % IV BOLUS
1000.0000 mL | Freq: Once | INTRAVENOUS | Status: AC
  Administered 2020-09-19: 02:00:00 1000 mL via INTRAVENOUS

## 2020-09-19 MED ORDER — ONDANSETRON HCL 4 MG/2ML IJ SOLN
4.0000 mg | Freq: Once | INTRAMUSCULAR | Status: AC
  Administered 2020-09-19: 02:00:00 4 mg via INTRAVENOUS

## 2020-09-19 NOTE — ED Notes (Signed)
Pt in deep sleep. Responsive and groans to pain. Daughter states she hasn't gotten much rest and thinks she may just be really tired. CBG checked. MD Paduchowski assessed pt and confirms pt okay for discharge. Pt placed in wheelchair by this RN and Risk manager. Medication given to daughter for administration at home. Daughter verbalized understanding for medication administration.

## 2020-09-19 NOTE — ED Notes (Signed)
Daughter gave permission to perform in and out catheterization on pt to retrieve urine sample. Pt moved to room for procedure privacy. In and out catheterization performed by this RN with [facility] NT. Pt brief changed and pants put back on. Pt covered with warm blankets and repositioned in bed. Urine sample sent to lab. Pt returned to hallway.

## 2020-09-19 NOTE — ED Provider Notes (Signed)
[facility] Emergency Department Provider Note  Time seen: 1:48 AM  I have reviewed the triage vital signs and the nursing notes.   HISTORY  Chief Complaint Urinary Tract Infection   HPI Shawna Lynch is a 85 y.o. female with a past medical history anxiety, arthritis, dementia, diabetes, hypertension, presents to the emergency department for urinary tract infection and concerns for dehydration.  According to the daughter patient was having nausea with 1 episode of vomiting Monday and 2-3 episodes Tuesday.  Patient was seen by her primary care doctor and was diagnosed with a urinary tract infection and was given ciprofloxacin.  Her primary care doctor was concerned over possible dehydration and sent him to the emergency department for evaluation.  Here the patient appears well, no distress.  Daughter is here with the patient.  Patient denies any complaints.  Past Medical History:  Diagnosis Date   Anxiety    Arthritis    oa   Coronary artery disease    04/06/17 PCI with DES to [facility], 90% in diag from LAD, nonobstructive disease in LAD, RCA. Normal EF.    Dementia ([place])    Depression    Diabetes mellitus without complication ([place])    Hypertension    NSTEMI (non-ST elevated myocardial infarction) ([place])    Ringing in ears    TIA (transient ischemic attack)     Patient Active Problem List   Diagnosis Date Noted   Vascular dementia without behavioral disturbance ([place]) 09/12/2020   Depression 09/12/2020   Acute ischemic stroke ([place]) - R brain stroke not seen on imaging 08/19/2020   TIA (transient ischemic attack) 08/13/2020   Mild cognitive impairment 03/09/2018   Pruritus 03/09/2018   CAD S/P percutaneous coronary angioplasty 04/27/2017   Dementia ([place]) 04/27/2017   Dyslipidemia 04/07/2017   Non-insulin treated type 2 diabetes mellitus ([place]) 04/06/2017   Essential hypertension 04/06/2017   History of non-ST elevation myocardial  infarction (NSTEMI) 04/03/2017    Past Surgical History:  Procedure Laterality Date   ABDOMINAL HYSTERECTOMY     CORONARY STENT INTERVENTION  04/06/2017   CORONARY STENT INTERVENTION N/A 04/06/2017   Procedure: Coronary Stent Intervention;  Surgeon: Sherren Mocha, MD;  Location: [place] [facility];  Service: Cardiovascular;  Laterality: N/A;   LEFT HEART CATH AND CORONARY ANGIOGRAPHY N/A 04/06/2017   Procedure: Left Heart Cath and Coronary Angiography;  Surgeon: Sherren Mocha, MD;  Location: [place] [facility];  Service: Cardiovascular;  Laterality: N/A;    Prior to Admission medications   Medication Sig Start Date End Date Taking? Authorizing Provider  aspirin EC 81 MG EC tablet Take 1 tablet (81 mg total) by mouth daily. 04/07/17   [name], NP  atorvastatin (LIPITOR) 80 MG tablet Take 0.5 tablets (40 mg total) by mouth daily at 6 PM. 08/24/20   Donzetta Starch, NP  carvedilol (COREG) 25 MG tablet Take 12.5 mg by mouth 2 (two) times daily with a meal.    [provider]  cholecalciferol (VITAMIN D) 1000 units tablet Take 1,000 Units by mouth daily.    [provider]  divalproex (DEPAKOTE) 250 MG DR tablet Take 1 tablet (250 mg total) by mouth 2 (two) times daily. 09/12/20   Marcial Pacas, MD  escitalopram (LEXAPRO) 10 MG tablet Take 10 mg by mouth daily. 06/25/20   [provider]  LANTUS SOLOSTAR 100 UNIT/ML Solostar Pen Inject 14 Units into the skin at bedtime. 06/17/20   [provider]  metFORMIN (GLUCOPHAGE)  500 MG tablet Take 500 mg by mouth daily.  08/20/19   [provider]  nitroGLYCERIN (NITROSTAT) 0.4 MG SL tablet Place 1 tablet (0.4 mg total) under the tongue every 5 (five) minutes x 3 doses as needed for chest pain. Patient taking differently: Place 0.4 mg under the tongue every 5 (five) minutes as needed for chest pain (max 3 doses). 04/07/17   [name], NP  omega-3 acid ethyl esters (LOVAZA) 1 g capsule Take 1  capsule (1 g total) by mouth 2 (two) times daily. 04/27/17   Erlene Quan, PA-C  ticagrelor (BRILINTA) 90 MG TABS tablet Take 1 tablet (90 mg total) by mouth 2 (two) times daily. 08/14/20   Maness, Arnette Norris, MD  vitamin B-12 (CYANOCOBALAMIN) 1000 MCG tablet Take 1,000 mcg by mouth daily.    [provider]    Allergies  Allergen Reactions   Penicillins Hives   Sulfa Antibiotics Rash   Vicodin [Hydrocodone-Acetaminophen] Nausea And Vomiting   Codeine Rash    Family History  Problem Relation Age of Onset   CAD Father    Cancer Sister    Hearing loss Mother    Hypertension Mother     Social History Social History   Tobacco Use   Smoking status: Former Smoker   Smokeless tobacco: Never Used   Tobacco comment: quit "20 years ago"  Vaping Use   Vaping Use: Never used  Substance Use Topics   Alcohol use: No   Drug use: No    Review of Systems Per patient and daughter Constitutional: Negative for fever. Cardiovascular: Negative for chest pain. Respiratory: Negative for shortness of breath. Gastrointestinal: Negative for abdominal pain.  Nausea vomiting yesterday. Genitourinary: Negative for urinary compaints All other ROS negative, although possibly limited due to baseline dementia.  ____________________________________________   PHYSICAL EXAM:  VITAL SIGNS: ED Triage Vitals [09/18/20 1908]  Enc Vitals Group     BP (!) 171/96     Pulse Rate 97     Resp 16     Temp 98.6 F (37 C)     Temp Source Oral     SpO2 98 %     Weight 134 lb 7.7 oz (61 kg)     Height 5\' 6"  (1.676 m)     Head Circumference      Peak Flow      Pain Score      Pain Loc      Pain Edu?      Excl. in [place]?    Constitutional: Awake alert.  No distress.  Well-appearing. Eyes: Normal exam ENT      Head: Normocephalic and atraumatic      Mouth/Throat: Mucous membranes are moist. Cardiovascular: Normal rate, regular rhythm. Respiratory: Normal respiratory effort without  tachypnea nor retractions. Breath sounds are clear Gastrointestinal: Soft and nontender. No distention. Musculoskeletal: Nontender with normal range of motion in all extremities. No lower extremity tenderness or edema. Neurologic:  Normal speech and language. No gross focal neurologic deficits Skin:  Skin is warm, dry and intact.  Psychiatric: Mood and affect are normal.  ____________________________________________    INITIAL IMPRESSION / ASSESSMENT AND PLAN / ED COURSE  Pertinent labs & imaging results that were available during my care of the patient were reviewed by me and considered in my medical decision making (see chart for details).   Patient presents emergency department for nausea vomiting recently diagnosed with urinary tract infection and placed on ciprofloxacin yesterday.  Overall the patient appears  well.  Lab work does show mild dehydration with a anion gap of 15 otherwise reassuring.  Unable to see the patient's urinalysis in our system or care everywhere we will attempt to obtain a new urine sample as well as urine culture.  We will IV hydrate and treat with Zofran in the emergency department.  Reassuringly patient has benign abdominal exam with a normal white blood cell count reassuring vitals including afebrile.  Patient's lab work largely nonrevealing/reassuring.  Urinalysis is consistent with urinary tract infection.  Patient is taking an antibiotic (Cipro).  We have sent a urine culture.  We will dose a one-time dose of fosfomycin in the emergency department.  Patient has had no further nausea or vomiting.  We will discharge home with a prescription for Zofran to be used if needed.  Daughter agreeable to plan of care.  LISEL SPIVA was evaluated in Emergency Department on 09/19/2020 for the symptoms described in the history of present illness. She was evaluated in the context of the global COVID-19 pandemic, which necessitated consideration that the patient might be at risk  for infection with the SARS-CoV-2 virus that causes COVID-19. Institutional protocols and algorithms that pertain to the evaluation of patients at risk for COVID-19 are in a state of rapid change based on information released by regulatory bodies including the CDC and federal and state organizations. These policies and algorithms were followed during the patient's care in the ED.  ____________________________________________   FINAL CLINICAL IMPRESSION(S) / ED DIAGNOSES  Nausea vomiting Urinary tract infection   Harvest Dark, MD 09/19/20 [phone]

## 2020-09-20 LAB — URINE CULTURE: Culture: NO GROWTH

## 2020-09-24 ENCOUNTER — Telehealth: Payer: Self-pay

## 2020-09-24 DIAGNOSIS — I69322 Dysarthria following cerebral infarction: Secondary | ICD-10-CM | POA: Diagnosis not present

## 2020-09-24 DIAGNOSIS — F0280 Dementia in other diseases classified elsewhere without behavioral disturbance: Secondary | ICD-10-CM | POA: Diagnosis not present

## 2020-09-24 DIAGNOSIS — I69354 Hemiplegia and hemiparesis following cerebral infarction affecting left non-dominant side: Secondary | ICD-10-CM | POA: Diagnosis not present

## 2020-09-24 DIAGNOSIS — I69391 Dysphagia following cerebral infarction: Secondary | ICD-10-CM | POA: Diagnosis not present

## 2020-09-24 DIAGNOSIS — E1165 Type 2 diabetes mellitus with hyperglycemia: Secondary | ICD-10-CM | POA: Diagnosis not present

## 2020-09-24 DIAGNOSIS — I69320 Aphasia following cerebral infarction: Secondary | ICD-10-CM | POA: Diagnosis not present

## 2020-09-24 DIAGNOSIS — G319 Degenerative disease of nervous system, unspecified: Secondary | ICD-10-CM | POA: Diagnosis not present

## 2020-09-24 DIAGNOSIS — E1122 Type 2 diabetes mellitus with diabetic chronic kidney disease: Secondary | ICD-10-CM | POA: Diagnosis not present

## 2020-09-24 DIAGNOSIS — I1310 Hypertensive heart and chronic kidney disease without heart failure, with stage 1 through stage 4 chronic kidney disease, or unspecified chronic kidney disease: Secondary | ICD-10-CM | POA: Diagnosis not present

## 2020-09-24 NOTE — Telephone Encounter (Signed)
Jasmine December called to cancel the patient's EEG. She states that the patient is no longer eating or drinking very much and does not feel the EEG is necessary. She just wanted me to send this as an fyi.

## 2020-09-24 NOTE — Telephone Encounter (Signed)
I called her daughter, the patient had UTI last week, she has declined. Not eating or drinking much, in the bed. Not sure what the future holds, is not in Hospice. Depakote is helping with agitation, not crying near as much. Wants to cancel EEG for now, if she improves I asked her to call back reschedule, or let us know if change in condition.

## 2020-09-26 ENCOUNTER — Other Ambulatory Visit: Payer: Medicare HMO

## 2020-09-27 DIAGNOSIS — I1310 Hypertensive heart and chronic kidney disease without heart failure, with stage 1 through stage 4 chronic kidney disease, or unspecified chronic kidney disease: Secondary | ICD-10-CM | POA: Diagnosis not present

## 2020-09-27 DIAGNOSIS — I69354 Hemiplegia and hemiparesis following cerebral infarction affecting left non-dominant side: Secondary | ICD-10-CM | POA: Diagnosis not present

## 2020-09-27 DIAGNOSIS — F0280 Dementia in other diseases classified elsewhere without behavioral disturbance: Secondary | ICD-10-CM | POA: Diagnosis not present

## 2020-09-27 DIAGNOSIS — I69322 Dysarthria following cerebral infarction: Secondary | ICD-10-CM | POA: Diagnosis not present

## 2020-09-27 DIAGNOSIS — I69391 Dysphagia following cerebral infarction: Secondary | ICD-10-CM | POA: Diagnosis not present

## 2020-09-27 DIAGNOSIS — E1165 Type 2 diabetes mellitus with hyperglycemia: Secondary | ICD-10-CM | POA: Diagnosis not present

## 2020-09-27 DIAGNOSIS — E1122 Type 2 diabetes mellitus with diabetic chronic kidney disease: Secondary | ICD-10-CM | POA: Diagnosis not present

## 2020-09-27 DIAGNOSIS — G319 Degenerative disease of nervous system, unspecified: Secondary | ICD-10-CM | POA: Diagnosis not present

## 2020-09-27 DIAGNOSIS — I69320 Aphasia following cerebral infarction: Secondary | ICD-10-CM | POA: Diagnosis not present

## 2020-10-02 DIAGNOSIS — I1310 Hypertensive heart and chronic kidney disease without heart failure, with stage 1 through stage 4 chronic kidney disease, or unspecified chronic kidney disease: Secondary | ICD-10-CM | POA: Diagnosis not present

## 2020-10-02 DIAGNOSIS — I69322 Dysarthria following cerebral infarction: Secondary | ICD-10-CM | POA: Diagnosis not present

## 2020-10-02 DIAGNOSIS — G319 Degenerative disease of nervous system, unspecified: Secondary | ICD-10-CM | POA: Diagnosis not present

## 2020-10-02 DIAGNOSIS — I69320 Aphasia following cerebral infarction: Secondary | ICD-10-CM | POA: Diagnosis not present

## 2020-10-02 DIAGNOSIS — F0280 Dementia in other diseases classified elsewhere without behavioral disturbance: Secondary | ICD-10-CM | POA: Diagnosis not present

## 2020-10-02 DIAGNOSIS — E1122 Type 2 diabetes mellitus with diabetic chronic kidney disease: Secondary | ICD-10-CM | POA: Diagnosis not present

## 2020-10-02 DIAGNOSIS — I69391 Dysphagia following cerebral infarction: Secondary | ICD-10-CM | POA: Diagnosis not present

## 2020-10-02 DIAGNOSIS — E1165 Type 2 diabetes mellitus with hyperglycemia: Secondary | ICD-10-CM | POA: Diagnosis not present

## 2020-10-02 DIAGNOSIS — I69354 Hemiplegia and hemiparesis following cerebral infarction affecting left non-dominant side: Secondary | ICD-10-CM | POA: Diagnosis not present

## 2020-10-09 DIAGNOSIS — G319 Degenerative disease of nervous system, unspecified: Secondary | ICD-10-CM | POA: Diagnosis not present

## 2020-10-09 DIAGNOSIS — F0280 Dementia in other diseases classified elsewhere without behavioral disturbance: Secondary | ICD-10-CM | POA: Diagnosis not present

## 2020-10-09 DIAGNOSIS — I69320 Aphasia following cerebral infarction: Secondary | ICD-10-CM | POA: Diagnosis not present

## 2020-10-09 DIAGNOSIS — I69322 Dysarthria following cerebral infarction: Secondary | ICD-10-CM | POA: Diagnosis not present

## 2020-10-09 DIAGNOSIS — I69391 Dysphagia following cerebral infarction: Secondary | ICD-10-CM | POA: Diagnosis not present

## 2020-10-09 DIAGNOSIS — I69354 Hemiplegia and hemiparesis following cerebral infarction affecting left non-dominant side: Secondary | ICD-10-CM | POA: Diagnosis not present

## 2020-10-09 DIAGNOSIS — I1310 Hypertensive heart and chronic kidney disease without heart failure, with stage 1 through stage 4 chronic kidney disease, or unspecified chronic kidney disease: Secondary | ICD-10-CM | POA: Diagnosis not present

## 2020-10-09 DIAGNOSIS — E1122 Type 2 diabetes mellitus with diabetic chronic kidney disease: Secondary | ICD-10-CM | POA: Diagnosis not present

## 2020-10-09 DIAGNOSIS — E1165 Type 2 diabetes mellitus with hyperglycemia: Secondary | ICD-10-CM | POA: Diagnosis not present

## 2020-10-15 NOTE — Progress Notes (Signed)
Late entry for missed Modified Rankin Score. Score based on the review of medical record.    08/23/20 1219  Modified Rankin (Stroke Patients Only)  Pre-Morbid Rankin Score 4  Modified Rankin [place], [place]   Acute Rehabilitation Services  Pager [phone] Office [phone] 10/15/2020

## 2020-10-18 DIAGNOSIS — F32A Depression, unspecified: Secondary | ICD-10-CM | POA: Diagnosis not present

## 2020-10-18 DIAGNOSIS — I25118 Atherosclerotic heart disease of native coronary artery with other forms of angina pectoris: Secondary | ICD-10-CM | POA: Diagnosis not present

## 2020-10-18 DIAGNOSIS — N1832 Chronic kidney disease, stage 3b: Secondary | ICD-10-CM | POA: Diagnosis not present

## 2020-10-18 DIAGNOSIS — F0280 Dementia in other diseases classified elsewhere without behavioral disturbance: Secondary | ICD-10-CM | POA: Diagnosis not present

## 2020-10-18 DIAGNOSIS — N183 Chronic kidney disease, stage 3 unspecified: Secondary | ICD-10-CM | POA: Diagnosis not present

## 2020-10-18 DIAGNOSIS — I10 Essential (primary) hypertension: Secondary | ICD-10-CM | POA: Diagnosis not present

## 2020-10-18 DIAGNOSIS — E1122 Type 2 diabetes mellitus with diabetic chronic kidney disease: Secondary | ICD-10-CM | POA: Diagnosis not present

## 2020-10-18 DIAGNOSIS — Z794 Long term (current) use of insulin: Secondary | ICD-10-CM | POA: Diagnosis not present

## 2020-10-18 DIAGNOSIS — F419 Anxiety disorder, unspecified: Secondary | ICD-10-CM | POA: Diagnosis not present

## 2020-11-05 DIAGNOSIS — N1832 Chronic kidney disease, stage 3b: Secondary | ICD-10-CM | POA: Diagnosis not present

## 2020-11-05 DIAGNOSIS — Z111 Encounter for screening for respiratory tuberculosis: Secondary | ICD-10-CM | POA: Diagnosis not present

## 2020-11-05 DIAGNOSIS — E1122 Type 2 diabetes mellitus with diabetic chronic kidney disease: Secondary | ICD-10-CM | POA: Diagnosis not present

## 2020-11-05 DIAGNOSIS — F419 Anxiety disorder, unspecified: Secondary | ICD-10-CM | POA: Diagnosis not present

## 2020-11-05 DIAGNOSIS — N183 Chronic kidney disease, stage 3 unspecified: Secondary | ICD-10-CM | POA: Diagnosis not present

## 2020-11-05 DIAGNOSIS — I25118 Atherosclerotic heart disease of native coronary artery with other forms of angina pectoris: Secondary | ICD-10-CM | POA: Diagnosis not present

## 2020-11-05 DIAGNOSIS — F0280 Dementia in other diseases classified elsewhere without behavioral disturbance: Secondary | ICD-10-CM | POA: Diagnosis not present

## 2020-11-05 DIAGNOSIS — I10 Essential (primary) hypertension: Secondary | ICD-10-CM | POA: Diagnosis not present

## 2020-11-05 DIAGNOSIS — E538 Deficiency of other specified B group vitamins: Secondary | ICD-10-CM | POA: Diagnosis not present

## 2020-11-05 DIAGNOSIS — Z794 Long term (current) use of insulin: Secondary | ICD-10-CM | POA: Diagnosis not present

## 2020-11-05 DIAGNOSIS — E559 Vitamin D deficiency, unspecified: Secondary | ICD-10-CM | POA: Diagnosis not present

## 2020-11-10 ENCOUNTER — Other Ambulatory Visit: Payer: Self-pay

## 2020-11-10 ENCOUNTER — Emergency Department: Payer: Medicare HMO

## 2020-11-10 ENCOUNTER — Inpatient Hospital Stay
Admission: EM | Admit: 2020-11-10 | Discharge: 2020-11-15 | DRG: 522 | Disposition: A | Discharge status: Home / Self Care | Payer: Medicare HMO | Attending: Internal Medicine | Admitting: Internal Medicine

## 2020-11-10 DIAGNOSIS — Z794 Long term (current) use of insulin: Secondary | ICD-10-CM

## 2020-11-10 DIAGNOSIS — Z471 Aftercare following joint replacement surgery: Secondary | ICD-10-CM | POA: Diagnosis not present

## 2020-11-10 DIAGNOSIS — Z66 Do not resuscitate: Secondary | ICD-10-CM | POA: Diagnosis present

## 2020-11-10 DIAGNOSIS — Z87891 Personal history of nicotine dependence: Secondary | ICD-10-CM

## 2020-11-10 DIAGNOSIS — S72001A Fracture of unspecified part of neck of right femur, initial encounter for closed fracture: Principal | ICD-10-CM | POA: Diagnosis present

## 2020-11-10 DIAGNOSIS — F0150 Vascular dementia without behavioral disturbance: Secondary | ICD-10-CM | POA: Diagnosis present

## 2020-11-10 DIAGNOSIS — Z96649 Presence of unspecified artificial hip joint: Secondary | ICD-10-CM

## 2020-11-10 DIAGNOSIS — T148XXA Other injury of unspecified body region, initial encounter: Secondary | ICD-10-CM | POA: Diagnosis not present

## 2020-11-10 DIAGNOSIS — R0902 Hypoxemia: Secondary | ICD-10-CM | POA: Diagnosis not present

## 2020-11-10 DIAGNOSIS — Z7901 Long term (current) use of anticoagulants: Secondary | ICD-10-CM

## 2020-11-10 DIAGNOSIS — S72041A Displaced fracture of base of neck of right femur, initial encounter for closed fracture: Secondary | ICD-10-CM | POA: Diagnosis not present

## 2020-11-10 DIAGNOSIS — Z955 Presence of coronary angioplasty implant and graft: Secondary | ICD-10-CM

## 2020-11-10 DIAGNOSIS — I10 Essential (primary) hypertension: Secondary | ICD-10-CM | POA: Diagnosis not present

## 2020-11-10 DIAGNOSIS — I2510 Atherosclerotic heart disease of native coronary artery without angina pectoris: Secondary | ICD-10-CM | POA: Diagnosis not present

## 2020-11-10 DIAGNOSIS — Z90710 Acquired absence of both cervix and uterus: Secondary | ICD-10-CM

## 2020-11-10 DIAGNOSIS — Z7982 Long term (current) use of aspirin: Secondary | ICD-10-CM | POA: Diagnosis not present

## 2020-11-10 DIAGNOSIS — Z8673 Personal history of transient ischemic attack (TIA), and cerebral infarction without residual deficits: Secondary | ICD-10-CM

## 2020-11-10 DIAGNOSIS — E876 Hypokalemia: Secondary | ICD-10-CM | POA: Diagnosis not present

## 2020-11-10 DIAGNOSIS — F32A Depression, unspecified: Secondary | ICD-10-CM | POA: Diagnosis present

## 2020-11-10 DIAGNOSIS — F419 Anxiety disorder, unspecified: Secondary | ICD-10-CM | POA: Diagnosis present

## 2020-11-10 DIAGNOSIS — E785 Hyperlipidemia, unspecified: Secondary | ICD-10-CM | POA: Diagnosis present

## 2020-11-10 DIAGNOSIS — Z79899 Other long term (current) drug therapy: Secondary | ICD-10-CM | POA: Diagnosis not present

## 2020-11-10 DIAGNOSIS — R000 Tachycardia, unspecified: Secondary | ICD-10-CM | POA: Diagnosis not present

## 2020-11-10 DIAGNOSIS — R079 Chest pain, unspecified: Secondary | ICD-10-CM | POA: Diagnosis not present

## 2020-11-10 DIAGNOSIS — M1990 Unspecified osteoarthritis, unspecified site: Secondary | ICD-10-CM | POA: Diagnosis present

## 2020-11-10 DIAGNOSIS — G4489 Other headache syndrome: Secondary | ICD-10-CM | POA: Diagnosis not present

## 2020-11-10 DIAGNOSIS — Z8249 Family history of ischemic heart disease and other diseases of the circulatory system: Secondary | ICD-10-CM

## 2020-11-10 DIAGNOSIS — R0602 Shortness of breath: Secondary | ICD-10-CM | POA: Diagnosis not present

## 2020-11-10 DIAGNOSIS — S728X1A Other fracture of right femur, initial encounter for closed fracture: Secondary | ICD-10-CM | POA: Diagnosis not present

## 2020-11-10 DIAGNOSIS — Z96641 Presence of right artificial hip joint: Secondary | ICD-10-CM | POA: Diagnosis not present

## 2020-11-10 DIAGNOSIS — D62 Acute posthemorrhagic anemia: Secondary | ICD-10-CM | POA: Diagnosis not present

## 2020-11-10 DIAGNOSIS — E538 Deficiency of other specified B group vitamins: Secondary | ICD-10-CM | POA: Diagnosis present

## 2020-11-10 DIAGNOSIS — I160 Hypertensive urgency: Secondary | ICD-10-CM | POA: Diagnosis not present

## 2020-11-10 DIAGNOSIS — Z885 Allergy status to narcotic agent status: Secondary | ICD-10-CM

## 2020-11-10 DIAGNOSIS — Z7984 Long term (current) use of oral hypoglycemic drugs: Secondary | ICD-10-CM | POA: Diagnosis not present

## 2020-11-10 DIAGNOSIS — W010XXA Fall on same level from slipping, tripping and stumbling without subsequent striking against object, initial encounter: Secondary | ICD-10-CM | POA: Diagnosis present

## 2020-11-10 DIAGNOSIS — Z20822 Contact with and (suspected) exposure to covid-19: Secondary | ICD-10-CM | POA: Diagnosis not present

## 2020-11-10 DIAGNOSIS — N179 Acute kidney failure, unspecified: Secondary | ICD-10-CM | POA: Diagnosis not present

## 2020-11-10 DIAGNOSIS — F418 Other specified anxiety disorders: Secondary | ICD-10-CM | POA: Diagnosis not present

## 2020-11-10 DIAGNOSIS — E119 Type 2 diabetes mellitus without complications: Secondary | ICD-10-CM | POA: Diagnosis not present

## 2020-11-10 DIAGNOSIS — I252 Old myocardial infarction: Secondary | ICD-10-CM | POA: Diagnosis not present

## 2020-11-10 DIAGNOSIS — M6281 Muscle weakness (generalized): Secondary | ICD-10-CM | POA: Diagnosis not present

## 2020-11-10 DIAGNOSIS — Z880 Allergy status to penicillin: Secondary | ICD-10-CM

## 2020-11-10 DIAGNOSIS — R5381 Other malaise: Secondary | ICD-10-CM | POA: Diagnosis not present

## 2020-11-10 DIAGNOSIS — Z882 Allergy status to sulfonamides status: Secondary | ICD-10-CM

## 2020-11-10 DIAGNOSIS — F0390 Unspecified dementia without behavioral disturbance: Secondary | ICD-10-CM | POA: Diagnosis present

## 2020-11-10 DIAGNOSIS — S72391A Other fracture of shaft of right femur, initial encounter for closed fracture: Secondary | ICD-10-CM

## 2020-11-10 DIAGNOSIS — R279 Unspecified lack of coordination: Secondary | ICD-10-CM | POA: Diagnosis not present

## 2020-11-10 DIAGNOSIS — R4182 Altered mental status, unspecified: Secondary | ICD-10-CM | POA: Diagnosis not present

## 2020-11-10 DIAGNOSIS — R0789 Other chest pain: Secondary | ICD-10-CM | POA: Diagnosis not present

## 2020-11-10 LAB — CBC WITH DIFFERENTIAL/PLATELET
Abs Immature Granulocytes: 0.04 10*3/uL (ref 0.00–0.07)
Basophils Absolute: 0 10*3/uL (ref 0.0–0.1)
Basophils Relative: 0 %
Eosinophils Absolute: 0.2 10*3/uL (ref 0.0–0.5)
Eosinophils Relative: 2 %
HCT: 35.7 % — ABNORMAL LOW (ref 36.0–46.0)
Hemoglobin: 11.9 g/dL — ABNORMAL LOW (ref 12.0–15.0)
Immature Granulocytes: 0 %
Lymphocytes Relative: 19 %
Lymphs Abs: 1.9 10*3/uL (ref 0.7–4.0)
MCH: 30.1 pg (ref 26.0–34.0)
MCHC: 33.3 g/dL (ref 30.0–36.0)
MCV: 90.4 fL (ref 80.0–100.0)
Monocytes Absolute: 0.7 10*3/uL (ref 0.1–1.0)
Monocytes Relative: 7 %
Neutro Abs: 7 10*3/uL (ref 1.7–7.7)
Neutrophils Relative %: 72 %
Platelets: 238 10*3/uL (ref 150–400)
RBC: 3.95 MIL/uL (ref 3.87–5.11)
RDW: 13.2 % (ref 11.5–15.5)
WBC: 9.9 10*3/uL (ref 4.0–10.5)
nRBC: 0 % (ref 0.0–0.2)

## 2020-11-10 LAB — BASIC METABOLIC PANEL
Anion gap: 12 (ref 5–15)
BUN: 15 mg/dL (ref 8–23)
CO2: 26 mmol/L (ref 22–32)
Calcium: 9.4 mg/dL (ref 8.9–10.3)
Chloride: 99 mmol/L (ref 98–111)
Creatinine, Ser: 0.84 mg/dL (ref 0.44–1.00)
GFR, Estimated: >60 mL/min (ref >60)
Glucose, Bld: 201 mg/dL — ABNORMAL HIGH (ref 70–99)
Potassium: 3.9 mmol/L (ref 3.5–5.1)
Sodium: 137 mmol/L (ref 135–145)

## 2020-11-10 LAB — PROTIME-INR
INR: 1.2 (ref 0.8–1.2)
Prothrombin Time: 14.4 seconds (ref 11.4–15.2)

## 2020-11-10 LAB — RESP PANEL BY RT-PCR (FLU A&B, COVID) ARPGX2
Influenza A by PCR: NEGATIVE
Influenza B by PCR: NEGATIVE
SARS Coronavirus 2 by RT PCR: NEGATIVE

## 2020-11-10 LAB — APTT: aPTT: 25 seconds (ref 24–36)

## 2020-11-10 MED ORDER — TRAZODONE HCL 50 MG PO TABS
25.0000 mg | ORAL_TABLET | Freq: Every evening | ORAL | Status: DC | PRN
Start: 2020-11-10 — End: 2020-11-15

## 2020-11-10 MED ORDER — ACETAMINOPHEN 325 MG PO TABS: 650.0000 mg | ORAL_TABLET | Freq: Four times a day (QID) | ORAL | Status: DC | PRN

## 2020-11-10 MED ORDER — SODIUM CHLORIDE 0.9 % IV SOLN: INTRAVENOUS | Status: DC

## 2020-11-10 MED ORDER — VITAMIN D 25 MCG (1000 UNIT) PO TABS: 1000.0000 [IU] | ORAL_TABLET | Freq: Every day | ORAL | Status: DC

## 2020-11-10 MED ORDER — CEFAZOLIN SODIUM-DEXTROSE 2-4 GM/100ML-% IV SOLN
2.0000 g | Freq: Three times a day (TID) | INTRAVENOUS | Status: DC
  Administered 2020-11-11 (×2): 2 g via INTRAVENOUS
  Filled 2020-11-10 (×5): qty 100

## 2020-11-10 MED ORDER — MAGNESIUM HYDROXIDE 400 MG/5ML PO SUSP: 30.0000 mL | Freq: Every day | ORAL | Status: DC | PRN

## 2020-11-10 MED ORDER — CARVEDILOL 3.125 MG PO TABS
6.2500 mg | ORAL_TABLET | Freq: Two times a day (BID) | ORAL | Status: DC
  Administered 2020-11-11 – 2020-11-15 (×8): 6.25 mg via ORAL
  Filled 2020-11-10: qty 1
  Filled 2020-11-10 (×3): qty 2
  Filled 2020-11-10: qty 1
  Filled 2020-11-10 (×5): qty 2

## 2020-11-10 MED ORDER — TRANEXAMIC ACID-NACL 1000-0.7 MG/100ML-% IV SOLN
1000.0000 mg | INTRAVENOUS | Status: DC
  Filled 2020-11-10 (×2): qty 100

## 2020-11-10 MED ORDER — LABETALOL HCL 5 MG/ML IV SOLN: 20.0000 mg | INTRAVENOUS | Status: DC | PRN

## 2020-11-10 MED ORDER — OMEGA-3-ACID ETHYL ESTERS 1 G PO CAPS: 1.0000 g | ORAL_CAPSULE | Freq: Two times a day (BID) | ORAL | Status: DC

## 2020-11-10 MED ORDER — ONDANSETRON HCL 4 MG PO TABS: 4.0000 mg | ORAL_TABLET | Freq: Every day | ORAL | Status: DC | PRN

## 2020-11-10 MED ORDER — VITAMIN B-12 1000 MCG PO TABS
1000.0000 ug | ORAL_TABLET | Freq: Every day | ORAL | Status: DC
  Administered 2020-11-12 – 2020-11-15 (×4): 1000 ug via ORAL
  Filled 2020-11-10 (×4): qty 1

## 2020-11-10 MED ORDER — ESCITALOPRAM OXALATE 10 MG PO TABS
10.0000 mg | ORAL_TABLET | Freq: Every day | ORAL | Status: DC
  Administered 2020-11-12 – 2020-11-15 (×4): 10 mg via ORAL
  Filled 2020-11-10 (×5): qty 1

## 2020-11-10 MED ORDER — TRANEXAMIC ACID-NACL 1000-0.7 MG/100ML-% IV SOLN
1000.0000 mg | INTRAVENOUS | Status: AC
  Administered 2020-11-11: 1000 mg via INTRAVENOUS
  Filled 2020-11-10: qty 100

## 2020-11-10 MED ORDER — ONDANSETRON HCL 4 MG/2ML IJ SOLN
4.0000 mg | Freq: Four times a day (QID) | INTRAMUSCULAR | Status: DC | PRN
  Administered 2020-11-11 (×2): 4 mg via INTRAVENOUS
  Filled 2020-11-10 (×2): qty 2

## 2020-11-10 MED ORDER — ACETAMINOPHEN 650 MG RE SUPP
650.0000 mg | Freq: Four times a day (QID) | RECTAL | Status: DC | PRN
Start: 2020-11-10 — End: 2020-11-11

## 2020-11-10 MED ORDER — NITROGLYCERIN 0.4 MG SL SUBL: 0.4000 mg | SUBLINGUAL_TABLET | SUBLINGUAL | Status: DC | PRN

## 2020-11-10 MED ORDER — MORPHINE SULFATE (PF) 2 MG/ML IV SOLN
2.0000 mg | INTRAVENOUS | Status: DC | PRN
  Administered 2020-11-10 – 2020-11-11 (×3): 2 mg via INTRAVENOUS
  Filled 2020-11-10 (×3): qty 1

## 2020-11-10 MED ORDER — HYDRALAZINE HCL 20 MG/ML IJ SOLN
10.0000 mg | Freq: Four times a day (QID) | INTRAMUSCULAR | Status: DC | PRN
  Filled 2020-11-10: qty 1

## 2020-11-10 MED ORDER — ATORVASTATIN CALCIUM 20 MG PO TABS
40.0000 mg | ORAL_TABLET | Freq: Every day | ORAL | Status: DC
  Administered 2020-11-12 – 2020-11-14 (×3): 40 mg via ORAL
  Filled 2020-11-10 (×3): qty 2

## 2020-11-10 MED ORDER — INSULIN GLARGINE 100 UNIT/ML ~~LOC~~ SOLN
14.0000 [IU] | Freq: Every day | SUBCUTANEOUS | Status: DC
  Administered 2020-11-10 – 2020-11-14 (×5): 14 [IU] via SUBCUTANEOUS
  Filled 2020-11-10 (×6): qty 0.14

## 2020-11-10 MED ORDER — DIVALPROEX SODIUM 250 MG PO DR TAB
250.0000 mg | DELAYED_RELEASE_TABLET | Freq: Two times a day (BID) | ORAL | Status: DC
  Administered 2020-11-10 – 2020-11-15 (×9): 250 mg via ORAL
  Filled 2020-11-10 (×12): qty 1

## 2020-11-10 MED ORDER — LABETALOL HCL 5 MG/ML IV SOLN
10.0000 mg | INTRAVENOUS | Status: DC | PRN
  Administered 2020-11-10: 10 mg via INTRAVENOUS
  Filled 2020-11-10: qty 4

## 2020-11-10 MED ORDER — ONDANSETRON HCL 4 MG PO TABS: 4.0000 mg | ORAL_TABLET | Freq: Four times a day (QID) | ORAL | Status: DC | PRN

## 2020-11-10 NOTE — H&P (Addendum)
[place]   PATIENT NAME: Shawna Lynch    MR#:  [id number]  DATE OF BIRTH:  1935-04-10  DATE OF ADMISSION:  11/10/2020  PRIMARY CARE PHYSICIAN: Philmore Pali, NP   Patient is coming from: Home  REQUESTING/REFERRING PHYSICIAN: Marjean Donna, MD  CHIEF COMPLAINT:   Chief Complaint  Patient presents with  . Fall    HISTORY OF PRESENT ILLNESS:  Shawna Lynch is a 86 y.o. Caucasian female with medical history significant for osteoarthritis, anxiety, coronary artery disease status post PCI and stent, dementia, depression, type II hypertension and TIA, who presented to the emergency room with acute onset of right hip pain after having a mechanical fall tripping on her dog and falling on her right hip.  Her family noted her right leg was shortened.  Her pain has been severe and getting worse with any movement.  It became better after 200 mcg of IV fentanyl given by EMS.  No reported nausea or vomiting or abdominal pain.  No reported chest pain or palpitations.  No loss of consciousness or presyncope.  No paresthesias or focal muscle weakness. ED Course: Upon arrival to the ER blood pressure was 206/126 3 with heart rate of 108 with otherwise normal vital signs.  Labs revealed a blood glucose of 201 and CBC showed mild anemia close to her baseline.  INR is 1.2 with PT 14.4 and PTT 25.  Influenza antigens and COVID-19 PCR came back negative.  EKG as reviewed by me : Showed sinus tachycardia with a rate of 106 with nonspecific T wave abnormalities. Imaging: Right hip x-ray showed mildly displaced nonangulated right mid femoral neck fracture with no dislocation.  Chest x-ray showed no acute cardiopulmonary disease.  Dr. Posey Pronto was notified about the patient.  She will be kept n.p.o. after midnight for operative intervention tomorrow.  She will be admitted to the surgical bed for further evaluation and management. PAST MEDICAL HISTORY:   Past Medical History:  Diagnosis Date  . Anxiety   .  Arthritis    oa  . Coronary artery disease    04/06/17 PCI with DES to [facility], 90% in diag from LAD, nonobstructive disease in LAD, RCA. Normal EF.   [name] Dementia ([place])   . Depression   . Diabetes mellitus without complication ([place])   . Hypertension   . NSTEMI (non-ST elevated myocardial infarction) ([place])   . Ringing in ears   . TIA (transient ischemic attack)     PAST SURGICAL HISTORY:   Past Surgical History:  Procedure Laterality Date  . ABDOMINAL HYSTERECTOMY    . CORONARY STENT INTERVENTION  04/06/2017  . CORONARY STENT INTERVENTION N/A 04/06/2017   Procedure: Coronary Stent Intervention;  Surgeon: Sherren Mocha, MD;  Location: [place] [facility];  Service: Cardiovascular;  Laterality: N/A;  . LEFT HEART CATH AND CORONARY ANGIOGRAPHY N/A 04/06/2017   Procedure: Left Heart Cath and Coronary Angiography;  Surgeon: Sherren Mocha, MD;  Location: [place] [facility];  Service: Cardiovascular;  Laterality: N/A;    SOCIAL HISTORY:   Social History   Tobacco Use  . Smoking status: Former Research scientist (life sciences)  . Smokeless tobacco: Never Used  . Tobacco comment: quit "20 years ago"  Substance Use Topics  . Alcohol use: No    FAMILY HISTORY:   Family History  Problem Relation Age of Onset  . CAD Father   . Cancer Sister   . Hearing loss Mother   . Hypertension Mother  DRUG ALLERGIES:   Allergies  Allergen Reactions  . Penicillins Hives  . Sulfa Antibiotics Rash  . Vicodin [Hydrocodone-Acetaminophen] Nausea And Vomiting  . Codeine Rash    REVIEW OF SYSTEMS:   ROS per the patient's daughter Ms. Shawna Lynch as the patient could not give history due to her dementia. As per history of present illness. All pertinent systems were reviewed above. Constitutional, HEENT, cardiovascular, respiratory, GI, GU, musculoskeletal, neuro, psychiatric, endocrine, integumentary and hematologic systems were reviewed and are otherwise negative/unremarkable except for positive findings mentioned above  in the HPI.   MEDICATIONS AT HOME:   Prior to Admission medications   Medication Sig Start Date End Date Taking? Authorizing Provider  aspirin EC 81 MG EC tablet Take 1 tablet (81 mg total) by mouth daily. 04/07/17   [name], NP  atorvastatin (LIPITOR) 80 MG tablet Take 0.5 tablets (40 mg total) by mouth daily at 6 PM. 08/24/20   Donzetta Starch, NP  carvedilol (COREG) 25 MG tablet Take 12.5 mg by mouth 2 (two) times daily with a meal.    [provider]  cholecalciferol (VITAMIN D) 1000 units tablet Take 1,000 Units by mouth daily.    [provider]  divalproex (DEPAKOTE) 250 MG DR tablet Take 1 tablet (250 mg total) by mouth 2 (two) times daily. 09/12/20   Marcial Pacas, MD  escitalopram (LEXAPRO) 10 MG tablet Take 10 mg by mouth daily. 06/25/20   [provider]  LANTUS SOLOSTAR 100 UNIT/ML Solostar Pen Inject 14 Units into the skin at bedtime. 06/17/20   [provider]  metFORMIN (GLUCOPHAGE) 500 MG tablet Take 500 mg by mouth daily.  08/20/19   [provider]  nitroGLYCERIN (NITROSTAT) 0.4 MG SL tablet Place 1 tablet (0.4 mg total) under the tongue every 5 (five) minutes x 3 doses as needed for chest pain. Patient taking differently: Place 0.4 mg under the tongue every 5 (five) minutes as needed for chest pain (max 3 doses). 04/07/17   [name], NP  omega-3 acid ethyl esters (LOVAZA) 1 g capsule Take 1 capsule (1 g total) by mouth 2 (two) times daily. 04/27/17   Erlene Quan, PA-C  ondansetron (ZOFRAN) 4 MG tablet Take 1 tablet (4 mg total) by mouth daily as needed for nausea or vomiting. 09/19/20   Harvest Dark, MD  ticagrelor (BRILINTA) 90 MG TABS tablet Take 1 tablet (90 mg total) by mouth 2 (two) times daily. 08/14/20   Maness, Arnette Norris, MD  vitamin B-12 (CYANOCOBALAMIN) 1000 MCG tablet Take 1,000 mcg by mouth daily.    [provider]      VITAL SIGNS:  Blood pressure (!) 206/123, pulse (!) 108, resp. rate  20, height 5\' 6"  (1.676 m), weight 65.8 kg, SpO2 94 %.  PHYSICAL EXAMINATION:  Physical Exam  GENERAL:  86 y.o.-year-old Caucasian female patient lying in the bed with no acute distress.  EYES: Pupils equal, round, reactive to light and accommodation. No scleral icterus. Extraocular muscles intact.  HEENT: Head atraumatic, normocephalic. Oropharynx and nasopharynx clear.  NECK:  Supple, no jugular venous distention. No thyroid enlargement, no tenderness.  LUNGS: Normal breath sounds bilaterally, no wheezing, rales,rhonchi or crepitation. No use of accessory muscles of respiration.  CARDIOVASCULAR: Regular rate and rhythm, S1, S2 normal. No murmurs, rubs, or gallops.  ABDOMEN: Soft, nondistended, nontender. Bowel sounds present. No organomegaly or mass.  EXTREMITIES: No pedal edema, cyanosis, or clubbing.  NEUROLOGIC: Cranial nerves II through XII are intact. Muscle  strength 5/5 in all extremities. Sensation intact. Gait not checked. Musculoskeletal: Right hip tenderness with shortening right leg that is laterally rotated.[name] PSYCHIATRIC: The patient is alert and oriented x 3.  Normal affect and good eye contact. SKIN: No obvious rash, lesion, or ulcer.   LABORATORY PANEL:   CBC Recent Labs  Lab 11/10/20 1753  WBC 9.9  HGB 11.9*  HCT 35.7*  PLT 238   ------------------------------------------------------------------------------------------------------------------  Chemistries  Recent Labs  Lab 11/10/20 1753  NA 137  K 3.9  CL 99  CO2 26  GLUCOSE 201*  BUN 15  CREATININE 0.84  CALCIUM 9.4   ------------------------------------------------------------------------------------------------------------------  Cardiac Enzymes No results for input(s): TROPONINI in the last 168 hours. ------------------------------------------------------------------------------------------------------------------  RADIOLOGY:  DG Chest Portable 1 View  Result Date: 11/10/2020 CLINICAL DATA:   Pt to ED from home via [facility] for mechanical fall. Pt tripped over dog. Pt did not hit lead or lose consciousness according to family. Pt in bed. Pt has baseline dementia per EMS. Pt not answering questions. Pt has right leg shortening and external rotation. C collar is in place, history of arthritis, CAD, diabetes, hypertension, TIA, NSTEMI, former smoker EXAM: PORTABLE CHEST 1 VIEW COMPARISON:  08/19/2020 FINDINGS: Cardiac silhouette is normal in size. No mediastinal or hilar masses. Clear lungs.  No pleural effusion or pneumothorax. Skeletal structures are demineralized but grossly intact. IMPRESSION: No active disease. Electronically Signed   By: Lajean Manes M.D.   On: 11/10/2020 18:24   DG Hip Unilat W or Wo Pelvis 2-3 Views Right  Result Date: 11/10/2020 CLINICAL DATA:  Pt to ED from home via [facility] for mechanical fall. Pt tripped over dog. Pt did not hit lead or lose consciousness according to family. Pt in bed. Pt has baseline dementia per EMS. Pt not answering questions. Pt has right leg shortening and external rotation. C collar is in place, history of arthritis, CAD, diabetes, hypertension, TIA, NSTEMI, former smoker EXAM: DG HIP (WITH OR WITHOUT PELVIS) 2-3V RIGHT COMPARISON:  None. FINDINGS: Mildly displaced fracture of the right femoral neck, across the mid neck, distal fracture component displacing superiorly by approximately 1.3 cm. No significant varus or valgus angulation. No other fractures.  No bone lesions. Hip joints, SI joints and symphysis pubis are normally aligned. Skeletal structures are demineralized. IMPRESSION: Mildly displaced, nonangulated, right mid femoral neck fracture. No dislocation. Electronically Signed   By: Lajean Manes M.D.   On: 11/10/2020 18:23      IMPRESSION AND PLAN:  Active Problems:   Closed right hip fracture (HCC)  1.  Closed right hip fracture secondary to mechanical fall. -The patient will be noted to a med-surgical monitored  bed. -Pain management will be provided. -She will be kept n.p.o. after midnight. -Orthopedic consultation will be obtained. -Dr. Posey Pronto was notified and is aware about the patient. -We will holding off her aspirin and Plavix. -She has not been taking her Brilinta for about a month. -The patient has a history of coronary artery disease and diabetes mellitus on insulin.  She is considered above average to moderate risk for perioperative cardiovascular events per the revised cardiac risk index.  She has no current pulmonary issues.  2.  Hypertensive urgency. -This is likely secondary to her pain. -We will continue her antihypertensives and place her on as needed IV labetalol.  3.  Coronary artery disease status post PCI and stent.  We will continue her as needed sublingual nitroglycerin, beta-blocker therapy with Coreg, statin therapy and hold  off aspirin and Plavix.  4.  Type 2 diabetes mellitus without complications. -She will be placed on supplement coverage with NovoLog and continue her basal coverage. -We will hold off her Metformin.  5.  Depression. -We will continue her Lexapro and Depakote.  6.  Dyslipidemia. -We will continue statin therapy.  7.  Vitamin B12 deficiency. -We will continue her vitamin B12.  DVT prophylaxis: SCDs.  No prophylaxis is currently held off pending postoperative. Code Status: The patient is DNR/DNI.  Intubation can be done only for surgery.  This was discussed and confirmed with the patient's daughter.  Family Communication:  The plan of care was discussed in details with the patient's daughter, Ms. Shawna Lynch. I answered all questions. The patient agreed to proceed with the above mentioned plan. Further management will depend upon hospital course. Disposition Plan: Skilled nursing facility for postoperative rehab will likely be needed. Consults called: Orthopedic consultation with Dr. Posey Pronto. All the records are reviewed and case discussed with ED  provider.  Status is: Inpatient  Remains inpatient appropriate because:Ongoing active pain requiring inpatient pain management, Ongoing diagnostic testing needed not appropriate for outpatient work up, Unsafe d/c plan, IV treatments appropriate due to intensity of illness or inability to take PO and Inpatient level of care appropriate due to severity of illness   Dispo: The patient is from: Home              Anticipated d/c is to: SNF              Anticipated d/c date is: 3 days              Patient currently is not medically stable to d/c.   Difficult to place patient No      TOTAL TIME TAKING CARE OF THIS PATIENT: 55 minutes.    Christel Mormon M.D on 11/10/2020 at 7:34 PM  [facility]   From 7 PM-7 AM, contact night-coverage [email or web address]  CC: Primary care physician; Philmore Pali, NP

## 2020-11-10 NOTE — ED Triage Notes (Signed)
Pt to ED from home via [facility] for mechanical fall. Pt tripped over dog. Pt did not hit lead or lose consciousness according to family. Pt in bed. Pt has baseline dementia per EMS. Pt not answering questions. Pt has R leg shortening and external rotation. C collar is in place. Pt in NAD at this time. Pt received 296mcg fentanyl via IV with EMS.

## 2020-11-10 NOTE — ED Notes (Signed)
UTA. Pt not answering questions

## 2020-11-10 NOTE — ED Provider Notes (Signed)
[facility] Emergency Department Provider Note  ____________________________________________   Event Date/Time   First MD Initiated Contact with Patient 11/10/20 1727     (approximate)  I have reviewed the triage vital signs and the nursing notes.   HISTORY  Chief Complaint Fall    HPI Shawna Lynch is a 86 y.o. female with coronary disease, depression, dementia, diabetes who comes in for fall. Patient had a mechanical fall that was witnessed by family in which she tripped on her dog and fell onto her right hip. Patient with right hip pain and her leg was shortened. Pain was severe, constant, worse with movement, better after a total of 200 mcgs from EMS. Per family patient is at baseline self.            Past Medical History:  Diagnosis Date  . Anxiety   . Arthritis    oa  . Coronary artery disease    04/06/17 PCI with DES to [facility], 90% in diag from LAD, nonobstructive disease in LAD, RCA. Normal EF.   [name] Dementia ([place])   . Depression   . Diabetes mellitus without complication ([place])   . Hypertension   . NSTEMI (non-ST elevated myocardial infarction) ([place])   . Ringing in ears   . TIA (transient ischemic attack)     Patient Active Problem List   Diagnosis Date Noted  . Vascular dementia without behavioral disturbance ([place]) 09/12/2020  . Depression 09/12/2020  . Acute ischemic stroke ([place]) - R brain stroke not seen on imaging 08/19/2020  . TIA (transient ischemic attack) 08/13/2020  . Mild cognitive impairment 03/09/2018  . Pruritus 03/09/2018  . CAD S/P percutaneous coronary angioplasty 04/27/2017  . Dementia ([place]) 04/27/2017  . Dyslipidemia 04/07/2017  . Non-insulin treated type 2 diabetes mellitus ([place]) 04/06/2017  . Essential hypertension 04/06/2017  . History of non-ST elevation myocardial infarction (NSTEMI) 04/03/2017    Past Surgical History:  Procedure Laterality Date  . ABDOMINAL HYSTERECTOMY    . CORONARY STENT INTERVENTION   04/06/2017  . CORONARY STENT INTERVENTION N/A 04/06/2017   Procedure: Coronary Stent Intervention;  Surgeon: Sherren Mocha, MD;  Location: [facility];  Service: Cardiovascular;  Laterality: N/A;  . LEFT HEART CATH AND CORONARY ANGIOGRAPHY N/A 04/06/2017   Procedure: Left Heart Cath and Coronary Angiography;  Surgeon: Sherren Mocha, MD;  Location: [facility];  Service: Cardiovascular;  Laterality: N/A;    Prior to Admission medications   Medication Sig Start Date End Date Taking? Authorizing Provider  aspirin EC 81 MG EC tablet Take 1 tablet (81 mg total) by mouth daily. 04/07/17   [name], NP  atorvastatin (LIPITOR) 80 MG tablet Take 0.5 tablets (40 mg total) by mouth daily at 6 PM. 08/24/20   Donzetta Starch, NP  carvedilol (COREG) 25 MG tablet Take 12.5 mg by mouth 2 (two) times daily with a meal.    [provider]  cholecalciferol (VITAMIN D) 1000 units tablet Take 1,000 Units by mouth daily.    [provider]  divalproex (DEPAKOTE) 250 MG DR tablet Take 1 tablet (250 mg total) by mouth 2 (two) times daily. 09/12/20   Marcial Pacas, MD  escitalopram (LEXAPRO) 10 MG tablet Take 10 mg by mouth daily. 06/25/20   [provider]  LANTUS SOLOSTAR 100 UNIT/ML Solostar Pen Inject 14 Units into the skin at bedtime. 06/17/20   [provider]  metFORMIN (GLUCOPHAGE) 500 MG tablet Take 500 mg by mouth daily.  08/20/19   [provider]  nitroGLYCERIN (NITROSTAT) 0.4 MG SL tablet Place 1 tablet (0.4 mg total) under the tongue every 5 (five) minutes x 3 doses as needed for chest pain. Patient taking differently: Place 0.4 mg under the tongue every 5 (five) minutes as needed for chest pain (max 3 doses). 04/07/17   [name], NP  omega-3 acid ethyl esters (LOVAZA) 1 g capsule Take 1 capsule (1 g total) by mouth 2 (two) times daily. 04/27/17   Erlene Quan, PA-C  ondansetron (ZOFRAN) 4 MG tablet Take 1 tablet (4 mg total) by mouth  daily as needed for nausea or vomiting. 09/19/20   Harvest Dark, MD  ticagrelor (BRILINTA) 90 MG TABS tablet Take 1 tablet (90 mg total) by mouth 2 (two) times daily. 08/14/20   Maness, Arnette Norris, MD  vitamin B-12 (CYANOCOBALAMIN) 1000 MCG tablet Take 1,000 mcg by mouth daily.    [provider]    Allergies Penicillins, Sulfa antibiotics, Vicodin [hydrocodone-acetaminophen], and Codeine  Family History  Problem Relation Age of Onset  . CAD Father   . Cancer Sister   . Hearing loss Mother   . Hypertension Mother     Social History Social History   Tobacco Use  . Smoking status: Former Research scientist (life sciences)  . Smokeless tobacco: Never Used  . Tobacco comment: quit "20 years ago"  Vaping Use  . Vaping Use: Never used  Substance Use Topics  . Alcohol use: No  . Drug use: No      Review of Systems Constitutional: No fever/chills Eyes: No visual changes. ENT: No sore throat. Cardiovascular: Denies chest pain. Respiratory: Denies shortness of breath. Gastrointestinal: No abdominal pain.  No nausea, no vomiting.  No diarrhea.  No constipation. Genitourinary: Negative for dysuria. Musculoskeletal: Positive hip pain Skin: Negative for rash. Neurological: Negative for headaches, focal weakness or numbness. All other ROS negative ____________________________________________   PHYSICAL EXAM:  VITAL SIGNS: Vitals:   11/10/20 1815  BP: (!) 206/123  Pulse: (!) 108  Resp: 20  SpO2: 94%     Constitutional: Alert appears somewhat sleepy from the fentanyl Eyes: Conjunctivae are normal. EOMI. Head: Atraumatic. Nose: No congestion/rhinnorhea. Mouth/Throat: Mucous membranes are moist.   Neck: No stridor. Trachea Midline. FROM Cardiovascular: Cardiac, regular rhythm. Grossly normal heart sounds.  Good peripheral circulation. Respiratory: Normal respiratory effort.  No retractions. Lungs CTAB. Gastrointestinal: Soft and nontender. No distention. No abdominal bruits.   Musculoskeletal: Tenderness in the right hip with decreased range of motion secondary to pain. 2+ distal pulse Neurologic:  Normal speech and language. No gross focal neurologic deficits are appreciated.  Skin:  Skin is warm, dry and intact. No rash noted. Psychiatric: Mood and affect are normal. Speech and behavior are normal. GU: Deferred   ____________________________________________   LABS (all labs ordered are listed, but only abnormal results are displayed)  Labs Reviewed  RESP PANEL BY RT-PCR (FLU A&B, COVID) ARPGX2  CBC WITH DIFFERENTIAL/PLATELET  PROTIME-INR  APTT  BASIC METABOLIC PANEL  TYPE AND SCREEN   ____________________________________________   ED ECG REPORT I, Vanessa Olivet, the attending physician, personally viewed and interpreted this ECG.  Tachycardia rate of 106, no ST ovation, no T wave inversions, normal intervals ____________________________________________  RADIOLOGY Robert Bellow, personally viewed and evaluated these images (plain radiographs) as part of my medical decision making, as well as reviewing the written report by the radiologist.  ED MD interpretation:  intratrochanter fracture on the right hip   Official radiology  report(s): DG Chest Portable 1 View  Result Date: 11/10/2020 CLINICAL DATA:  Pt to ED from home via [facility] for mechanical fall. Pt tripped over dog. Pt did not hit lead or lose consciousness according to family. Pt in bed. Pt has baseline dementia per EMS. Pt not answering questions. Pt has right leg shortening and external rotation. C collar is in place, history of arthritis, CAD, diabetes, hypertension, TIA, NSTEMI, former smoker EXAM: PORTABLE CHEST 1 VIEW COMPARISON:  08/19/2020 FINDINGS: Cardiac silhouette is normal in size. No mediastinal or hilar masses. Clear lungs.  No pleural effusion or pneumothorax. Skeletal structures are demineralized but grossly intact. IMPRESSION: No active disease. Electronically Signed    By: Lajean Manes M.D.   On: 11/10/2020 18:24   DG Hip Unilat W or Wo Pelvis 2-3 Views Right  Result Date: 11/10/2020 CLINICAL DATA:  Pt to ED from home via [facility] for mechanical fall. Pt tripped over dog. Pt did not hit lead or lose consciousness according to family. Pt in bed. Pt has baseline dementia per EMS. Pt not answering questions. Pt has right leg shortening and external rotation. C collar is in place, history of arthritis, CAD, diabetes, hypertension, TIA, NSTEMI, former smoker EXAM: DG HIP (WITH OR WITHOUT PELVIS) 2-3V RIGHT COMPARISON:  None. FINDINGS: Mildly displaced fracture of the right femoral neck, across the mid neck, distal fracture component displacing superiorly by approximately 1.3 cm. No significant varus or valgus angulation. No other fractures.  No bone lesions. Hip joints, SI joints and symphysis pubis are normally aligned. Skeletal structures are demineralized. IMPRESSION: Mildly displaced, nonangulated, right mid femoral neck fracture. No dislocation. Electronically Signed   By: Lajean Manes M.D.   On: 11/10/2020 18:23    ____________________________________________   PROCEDURES  Procedure(s) performed (including Critical Care):  Procedures   ____________________________________________   INITIAL IMPRESSION / ASSESSMENT AND PLAN / ED COURSE  BERA PINELA was evaluated in Emergency Department on 11/10/2020 for the symptoms described in the history of present illness. She was evaluated in the context of the global COVID-19 pandemic, which necessitated consideration that the patient might be at risk for infection with the SARS-CoV-2 virus that causes COVID-19. Institutional protocols and algorithms that pertain to the evaluation of patients at risk for COVID-19 are in a state of rapid change based on information released by regulatory bodies including the CDC and federal and state organizations. These policies and algorithms were followed during the patient's  care in the ED.    Patient is a 86 year old female who comes in for right hip pain. Suspect that this is most likely a hip fracture. Will get x-ray to further evaluate. Currently neurovascularly intact just limited motion secondary to pain. No chest wall tenderness or abdominal tenderness to suggest other injuries. Did not hit head per EMS. Will confirm with family to make sure no signs of intracranial hemorrhage or cervical fracture.  Patient is an 86 year old who comes in with right hip pain status post fall.  I did call the family and confirmed that the patient did not hit her head, did not lose consciousness.  There does not seem to be any chest wall tenderness or abdominal tenderness to suggest other injuries.  Only right hip pain to suggest hip fracture.  Good distal pulse at this time.  No other extremity pain but difficult access given patient has had a good amount of fentanyl at time of the evaluation which has made her a little bit sleepy.  Will get  a chest x-ray to rule out any rib fractures and labs evaluate for any electrolyte abnormalities, AKI.  X-ray confirms fracture of the right hip  D/w dr. Posey Pronto recommend admission to hospital          ____________________________________________   FINAL CLINICAL IMPRESSION(S) / ED DIAGNOSES   Final diagnoses:  Other closed fracture of shaft of right femur, initial encounter ([place])      MEDICATIONS GIVEN DURING THIS VISIT:  Medications - No data to display   ED Discharge Orders    None       Note:  This document was prepared using Dragon voice recognition software and may include unintentional dictation errors.   Vanessa Hayden, MD 11/10/20 [place]

## 2020-11-10 NOTE — Progress Notes (Addendum)
Full consult note and discussion with patient to follow tomorrow AM. Informed consent obtained via phone with daughter (medical PoA).  Called by ED staff. Imaging reviewed.  - Plan for surgery tomorrow, likely afternoon.  - NPO after midnight - Hold anticoagulation. Of note, per daughter, patient has not been taking Brilinta for ~1 month.  - Admit to Hospitalist team.

## 2020-11-10 NOTE — ED Notes (Signed)
Pt back from xray. Pt moaning in pain. Labs drawn. Pt still not answering triage screening questions.

## 2020-11-10 NOTE — Progress Notes (Signed)
Pt arrived to floor via stretcher with IV fluids alert and was orientated to room and staff. C/o pain in right hip. Was given IV pain medication prior to transporting to floor. On tele monitor. Call light in reach. Resting quietly at this moment. Will be NPO at midnight.

## 2020-11-10 NOTE — ED Notes (Signed)
Pt to xray

## 2020-11-10 NOTE — ED Notes (Signed)
C collar removed per EDP verbal order. Warm blanket provided.

## 2020-11-11 ENCOUNTER — Other Ambulatory Visit: Payer: Self-pay

## 2020-11-11 ENCOUNTER — Inpatient Hospital Stay: Payer: Medicare HMO | Admitting: Anesthesiology

## 2020-11-11 ENCOUNTER — Inpatient Hospital Stay: Payer: Medicare HMO

## 2020-11-11 ENCOUNTER — Encounter
Admission: EM | Disposition: A | Discharge status: Home / Self Care | Payer: Self-pay | Source: Home / Self Care | Attending: Internal Medicine

## 2020-11-11 DIAGNOSIS — T148XXA Other injury of unspecified body region, initial encounter: Secondary | ICD-10-CM

## 2020-11-11 DIAGNOSIS — S72391A Other fracture of shaft of right femur, initial encounter for closed fracture: Secondary | ICD-10-CM

## 2020-11-11 DIAGNOSIS — Z96649 Presence of unspecified artificial hip joint: Secondary | ICD-10-CM

## 2020-11-11 HISTORY — PX: HIP ARTHROPLASTY: SHX981

## 2020-11-11 LAB — GLUCOSE, CAPILLARY
Glucose-Capillary: 127 mg/dL — ABNORMAL HIGH (ref 70–99)
Glucose-Capillary: 142 mg/dL — ABNORMAL HIGH (ref 70–99)
Glucose-Capillary: 159 mg/dL — ABNORMAL HIGH (ref 70–99)
Glucose-Capillary: 175 mg/dL — ABNORMAL HIGH (ref 70–99)
Glucose-Capillary: 184 mg/dL — ABNORMAL HIGH (ref 70–99)
Glucose-Capillary: 201 mg/dL — ABNORMAL HIGH (ref 70–99)

## 2020-11-11 LAB — CBC
HCT: 34.6 % — ABNORMAL LOW (ref 36.0–46.0)
Hemoglobin: 11.6 g/dL — ABNORMAL LOW (ref 12.0–15.0)
MCH: 30.4 pg (ref 26.0–34.0)
MCHC: 33.5 g/dL (ref 30.0–36.0)
MCV: 90.6 fL (ref 80.0–100.0)
Platelets: 202 10*3/uL (ref 150–400)
RBC: 3.82 MIL/uL — ABNORMAL LOW (ref 3.87–5.11)
RDW: 13.4 % (ref 11.5–15.5)
WBC: 10.2 10*3/uL (ref 4.0–10.5)
nRBC: 0 % (ref 0.0–0.2)

## 2020-11-11 LAB — BASIC METABOLIC PANEL
Anion gap: 9 (ref 5–15)
BUN: 13 mg/dL (ref 8–23)
CO2: 27 mmol/L (ref 22–32)
Calcium: 8.8 mg/dL — ABNORMAL LOW (ref 8.9–10.3)
Chloride: 100 mmol/L (ref 98–111)
Creatinine, Ser: 0.74 mg/dL (ref 0.44–1.00)
GFR, Estimated: >60 mL/min (ref >60)
Glucose, Bld: 154 mg/dL — ABNORMAL HIGH (ref 70–99)
Potassium: 3.4 mmol/L — ABNORMAL LOW (ref 3.5–5.1)
Sodium: 136 mmol/L (ref 135–145)

## 2020-11-11 SURGERY — HEMIARTHROPLASTY, HIP, DIRECT ANTERIOR APPROACH, FOR FRACTURE
Anesthesia: General | Site: Hip | Laterality: Right

## 2020-11-11 MED ORDER — CEFAZOLIN SODIUM-DEXTROSE 1-4 GM/50ML-% IV SOLN
1.0000 g | Freq: Four times a day (QID) | INTRAVENOUS | Status: AC
  Administered 2020-11-11 – 2020-11-12 (×2): 1 g via INTRAVENOUS
  Filled 2020-11-11 (×2): qty 50

## 2020-11-11 MED ORDER — ONDANSETRON HCL 4 MG/2ML IJ SOLN
INTRAMUSCULAR | Status: DC | PRN
  Administered 2020-11-11: 4 mg via INTRAVENOUS

## 2020-11-11 MED ORDER — KETOROLAC TROMETHAMINE 15 MG/ML IJ SOLN
7.5000 mg | Freq: Four times a day (QID) | INTRAMUSCULAR | Status: DC
  Administered 2020-11-11 – 2020-11-12 (×3): 7.5 mg via INTRAVENOUS
  Filled 2020-11-11 (×3): qty 1

## 2020-11-11 MED ORDER — LIDOCAINE HCL (PF) 2 % IJ SOLN
INTRAMUSCULAR | Status: AC
  Filled 2020-11-11: qty 5

## 2020-11-11 MED ORDER — SODIUM CHLORIDE 0.9 % IV SOLN: INTRAVENOUS | Status: DC

## 2020-11-11 MED ORDER — DEXAMETHASONE SODIUM PHOSPHATE 10 MG/ML IJ SOLN
INTRAMUSCULAR | Status: DC | PRN
  Administered 2020-11-11: 5 mg via INTRAVENOUS

## 2020-11-11 MED ORDER — CARVEDILOL 3.125 MG PO TABS: 6.2500 mg | ORAL_TABLET | Freq: Two times a day (BID) | ORAL | Status: DC

## 2020-11-11 MED ORDER — BUPIVACAINE LIPOSOME 1.3 % IJ SUSP
INTRAMUSCULAR | Status: DC | PRN
  Administered 2020-11-11: 50 mL

## 2020-11-11 MED ORDER — METOCLOPRAMIDE HCL 10 MG PO TABS: 5.0000 mg | ORAL_TABLET | Freq: Three times a day (TID) | ORAL | Status: DC | PRN

## 2020-11-11 MED ORDER — TRANEXAMIC ACID-NACL 1000-0.7 MG/100ML-% IV SOLN: 1000.0000 mg | Freq: Once | INTRAVENOUS | Status: AC

## 2020-11-11 MED ORDER — ROCURONIUM BROMIDE 100 MG/10ML IV SOLN
INTRAVENOUS | Status: DC | PRN
  Administered 2020-11-11: 50 mg via INTRAVENOUS

## 2020-11-11 MED ORDER — HYDROMORPHONE HCL 1 MG/ML IJ SOLN
0.2500 mg | INTRAMUSCULAR | Status: DC | PRN
  Administered 2020-11-14: 0.25 mg via INTRAVENOUS
  Filled 2020-11-11: qty 1

## 2020-11-11 MED ORDER — PHENYLEPHRINE HCL (PRESSORS) 10 MG/ML IV SOLN
INTRAVENOUS | Status: DC | PRN
  Administered 2020-11-11 (×7): 100 ug via INTRAVENOUS

## 2020-11-11 MED ORDER — TRAMADOL HCL 50 MG PO TABS
50.0000 mg | ORAL_TABLET | Freq: Four times a day (QID) | ORAL | Status: DC
  Administered 2020-11-12 – 2020-11-15 (×13): 50 mg via ORAL
  Filled 2020-11-11 (×14): qty 1

## 2020-11-11 MED ORDER — LIDOCAINE HCL (CARDIAC) PF 100 MG/5ML IV SOSY
PREFILLED_SYRINGE | INTRAVENOUS | Status: DC | PRN
  Administered 2020-11-11: 50 mg via INTRAVENOUS

## 2020-11-11 MED ORDER — SENNOSIDES-DOCUSATE SODIUM 8.6-50 MG PO TABS: 1.0000 | ORAL_TABLET | Freq: Every evening | ORAL | Status: DC | PRN

## 2020-11-11 MED ORDER — BISACODYL 10 MG RE SUPP
10.0000 mg | Freq: Every day | RECTAL | Status: DC | PRN
Start: 2020-11-11 — End: 2020-11-15

## 2020-11-11 MED ORDER — FENTANYL CITRATE (PF) 100 MCG/2ML IJ SOLN: 25.0000 ug | INTRAMUSCULAR | Status: DC | PRN

## 2020-11-11 MED ORDER — ONDANSETRON HCL 4 MG PO TABS: 4.0000 mg | ORAL_TABLET | Freq: Four times a day (QID) | ORAL | Status: DC | PRN

## 2020-11-11 MED ORDER — FLEET ENEMA 7-19 GM/118ML RE ENEM: 1.0000 | ENEMA | Freq: Once | RECTAL | Status: DC | PRN

## 2020-11-11 MED ORDER — TRANEXAMIC ACID-NACL 1000-0.7 MG/100ML-% IV SOLN
1000.0000 mg | INTRAVENOUS | Status: AC
  Administered 2020-11-11: 1000 mg via INTRAVENOUS
  Filled 2020-11-11: qty 100

## 2020-11-11 MED ORDER — OXYCODONE HCL 5 MG PO TABS: 2.5000 mg | ORAL_TABLET | ORAL | Status: DC | PRN

## 2020-11-11 MED ORDER — PROPOFOL 10 MG/ML IV BOLUS
INTRAVENOUS | Status: DC | PRN
  Administered 2020-11-11: 120 mg via INTRAVENOUS

## 2020-11-11 MED ORDER — ONDANSETRON HCL 4 MG/2ML IJ SOLN
INTRAMUSCULAR | Status: AC
  Filled 2020-11-11: qty 2

## 2020-11-11 MED ORDER — EPHEDRINE 5 MG/ML INJ
INTRAVENOUS | Status: AC
  Filled 2020-11-11: qty 10

## 2020-11-11 MED ORDER — CLOPIDOGREL BISULFATE 75 MG PO TABS
75.0000 mg | ORAL_TABLET | Freq: Every day | ORAL | Status: DC
Start: 2020-11-12 — End: 2020-11-15
  Administered 2020-11-12 – 2020-11-15 (×4): 75 mg via ORAL
  Filled 2020-11-11 (×4): qty 1

## 2020-11-11 MED ORDER — FENTANYL CITRATE (PF) 100 MCG/2ML IJ SOLN
INTRAMUSCULAR | Status: DC | PRN
  Administered 2020-11-11 (×2): 50 ug via INTRAVENOUS

## 2020-11-11 MED ORDER — ACETAMINOPHEN 10 MG/ML IV SOLN
INTRAVENOUS | Status: DC | PRN
  Administered 2020-11-11: 1000 mg via INTRAVENOUS

## 2020-11-11 MED ORDER — SUGAMMADEX SODIUM 200 MG/2ML IV SOLN
INTRAVENOUS | Status: DC | PRN
  Administered 2020-11-11: 200 mg via INTRAVENOUS

## 2020-11-11 MED ORDER — PROPOFOL 10 MG/ML IV BOLUS
INTRAVENOUS | Status: AC
  Filled 2020-11-11: qty 20

## 2020-11-11 MED ORDER — METOCLOPRAMIDE HCL 5 MG/ML IJ SOLN: 5.0000 mg | Freq: Three times a day (TID) | INTRAMUSCULAR | Status: DC | PRN

## 2020-11-11 MED ORDER — DOCUSATE SODIUM 100 MG PO CAPS
100.0000 mg | ORAL_CAPSULE | Freq: Two times a day (BID) | ORAL | Status: DC
  Administered 2020-11-11 – 2020-11-15 (×8): 100 mg via ORAL
  Filled 2020-11-11 (×8): qty 1

## 2020-11-11 MED ORDER — OXYCODONE HCL 5 MG PO TABS: 5.0000 mg | ORAL_TABLET | ORAL | Status: DC | PRN

## 2020-11-11 MED ORDER — ACETAMINOPHEN 10 MG/ML IV SOLN
INTRAVENOUS | Status: AC
  Filled 2020-11-11: qty 100

## 2020-11-11 MED ORDER — PHENOL 1.4 % MT LIQD
1.0000 | OROMUCOSAL | Status: DC | PRN
  Filled 2020-11-11: qty 177

## 2020-11-11 MED ORDER — SODIUM CHLORIDE 0.9 % IR SOLN
Status: DC | PRN
  Administered 2020-11-11: 1000 mL

## 2020-11-11 MED ORDER — DEXAMETHASONE SODIUM PHOSPHATE 10 MG/ML IJ SOLN
INTRAMUSCULAR | Status: AC
  Filled 2020-11-11: qty 1

## 2020-11-11 MED ORDER — ONDANSETRON HCL 4 MG/2ML IJ SOLN: 4.0000 mg | Freq: Once | INTRAMUSCULAR | Status: DC | PRN

## 2020-11-11 MED ORDER — ACETAMINOPHEN 500 MG PO TABS
1000.0000 mg | ORAL_TABLET | Freq: Three times a day (TID) | ORAL | Status: AC
  Administered 2020-11-11 – 2020-11-12 (×4): 1000 mg via ORAL
  Filled 2020-11-11 (×4): qty 2

## 2020-11-11 MED ORDER — FENTANYL CITRATE (PF) 100 MCG/2ML IJ SOLN
INTRAMUSCULAR | Status: AC
  Filled 2020-11-11: qty 2

## 2020-11-11 MED ORDER — ONDANSETRON HCL 4 MG/2ML IJ SOLN: 4.0000 mg | Freq: Four times a day (QID) | INTRAMUSCULAR | Status: DC | PRN

## 2020-11-11 MED ORDER — ASPIRIN EC 325 MG PO TBEC
325.0000 mg | DELAYED_RELEASE_TABLET | Freq: Every day | ORAL | Status: DC
  Administered 2020-11-12 – 2020-11-15 (×4): 325 mg via ORAL
  Filled 2020-11-11 (×4): qty 1

## 2020-11-11 MED ORDER — MENTHOL 3 MG MT LOZG
1.0000 | LOZENGE | OROMUCOSAL | Status: DC | PRN
  Filled 2020-11-11: qty 9

## 2020-11-11 MED ORDER — TRANEXAMIC ACID-NACL 1000-0.7 MG/100ML-% IV SOLN
INTRAVENOUS | Status: AC
  Administered 2020-11-11: 1000 mg via INTRAVENOUS
  Filled 2020-11-11: qty 100

## 2020-11-11 SURGICAL SUPPLY — 75 items
ADH SKN CLS APL DERMABOND .7 (GAUZE/BANDAGES/DRESSINGS) ×1
APL PRP STRL LF DISP 70% ISPRP (MISCELLANEOUS) ×1
BLADE SAW SGTL 13X75X1.27 (BLADE) ×2 IMPLANT
BLADE SURG SZ10 CARB STEEL (BLADE) ×2 IMPLANT
BNDG COHESIVE 4X5 TAN STRL (GAUZE/BANDAGES/DRESSINGS) ×2 IMPLANT
CEMENT GMV SMARTSET GENT 40G (Cement) ×2 IMPLANT
CEMENT RESTRICTOR DEPUY SZ 3 (Cement) ×1 IMPLANT
CHLORAPREP W/TINT 26 (MISCELLANEOUS) ×2 IMPLANT
COVER BACK TABLE REUSABLE LG (DRAPES) ×2 IMPLANT
COVER MAYO STAND STRL (DRAPES) ×2 IMPLANT
COVER WAND RF STERILE (DRAPES) ×2 IMPLANT
DERMABOND ADVANCED (GAUZE/BANDAGES/DRESSINGS) ×1
DERMABOND ADVANCED .7 DNX12 (GAUZE/BANDAGES/DRESSINGS) ×1 IMPLANT
DRAPE 3/4 80X56 (DRAPES) ×6 IMPLANT
DRAPE INCISE IOBAN 66X60 STRL (DRAPES) ×2 IMPLANT
DRAPE SPLIT 6X30 W/TAPE (DRAPES) ×2 IMPLANT
DRAPE SURG 17X11 SM STRL (DRAPES) ×2 IMPLANT
DRAPE U-SHAPE 47X51 STRL (DRAPES) ×2 IMPLANT
DRSG OPSITE POSTOP 4X10 (GAUZE/BANDAGES/DRESSINGS) ×2 IMPLANT
DRSG OPSITE POSTOP 4X8 (GAUZE/BANDAGES/DRESSINGS) ×2 IMPLANT
ELECT BLADE 6.5 EXT (BLADE) ×2 IMPLANT
ELECT CAUTERY BLADE 6.4 (BLADE) ×2 IMPLANT
ELECT REM PT RETURN 9FT ADLT (ELECTROSURGICAL) ×2
ELECTRODE REM PT RTRN 9FT ADLT (ELECTROSURGICAL) ×1 IMPLANT
GAUZE SPONGE 4X4 12PLY STRL (GAUZE/BANDAGES/DRESSINGS) ×2 IMPLANT
GAUZE XEROFORM 1X8 LF (GAUZE/BANDAGES/DRESSINGS) ×2 IMPLANT
GLOVE SRG 8 PF TXTR STRL LF DI (GLOVE) ×2 IMPLANT
GLOVE SURG ORTHO LTX SZ8 (GLOVE) ×4 IMPLANT
GLOVE SURG UNDER POLY LF SZ8 (GLOVE) ×4
GOWN STRL REUS W/ TWL LRG LVL3 (GOWN DISPOSABLE) ×1 IMPLANT
GOWN STRL REUS W/ TWL XL LVL3 (GOWN DISPOSABLE) ×1 IMPLANT
GOWN STRL REUS W/TWL LRG LVL3 (GOWN DISPOSABLE) ×2
GOWN STRL REUS W/TWL XL LVL3 (GOWN DISPOSABLE) ×2
HEAD MODULAR ENDO (Orthopedic Implant) ×2 IMPLANT
HEAD UNPLR 47XMDLR STRL HIP (Orthopedic Implant) IMPLANT
HOOD PEEL AWAY FLYTE STAYCOOL (MISCELLANEOUS) ×2 IMPLANT
KIT TURNOVER KIT A (KITS) ×2 IMPLANT
MANIFOLD NEPTUNE II (INSTRUMENTS) ×2 IMPLANT
NDL FILTER BLUNT 18X1 1/2 (NEEDLE) ×1 IMPLANT
NDL MAYO CATGUT SZ4 TPR NDL (NEEDLE) ×1 IMPLANT
NDL SAFETY ECLIPSE 18X1.5 (NEEDLE) ×1 IMPLANT
NEEDLE FILTER BLUNT 18X 1/2SAF (NEEDLE) ×1
NEEDLE FILTER BLUNT 18X1 1/2 (NEEDLE) ×1 IMPLANT
NEEDLE HYPO 18GX1.5 SHARP (NEEDLE) ×2
NEEDLE HYPO 22GX1.5 SAFETY (NEEDLE) ×2 IMPLANT
NEEDLE MAYO CATGUT SZ4 (NEEDLE) ×2 IMPLANT
NEPTUNE MANIFOLD (MISCELLANEOUS) ×2 IMPLANT
NS IRRIG 1000ML POUR BTL (IV SOLUTION) ×2 IMPLANT
PACK HIP PROSTHESIS (MISCELLANEOUS) ×2 IMPLANT
PENCIL SMOKE ULTRAEVAC 22 CON (MISCELLANEOUS) ×2 IMPLANT
PILLOW ABDUCTION FOAM SM (MISCELLANEOUS) ×2 IMPLANT
PULSAVAC PLUS IRRIG FAN TIP (DISPOSABLE) ×2
SLEEVE UNITRAX V40 (Orthopedic Implant) ×2 IMPLANT
SLEEVE UNITRAX V40 +4 (Orthopedic Implant) IMPLANT
SOL .9 NS 3000ML IRR  AL (IV SOLUTION) ×2
SOL .9 NS 3000ML IRR AL (IV SOLUTION) ×1
SOL .9 NS 3000ML IRR UROMATIC (IV SOLUTION) ×1 IMPLANT
SPACER OSTEO CEMENT (Spacer) ×2 IMPLANT
SPACER OSTEO CEMENT 15NT (Spacer) IMPLANT
STAPLER SKIN PROX 35W (STAPLE) ×2 IMPLANT
STEM HIP ACCOLADE SZ5 37X145 (Stem) ×1 IMPLANT
SUT ETHIBOND #5 BRAIDED 30INL (SUTURE) ×2 IMPLANT
SUT MNCRL 4-0 (SUTURE) ×2
SUT MNCRL 4-0 27XMFL (SUTURE) ×1
SUT VIC AB 0 CT1 36 (SUTURE) ×2 IMPLANT
SUT VIC AB 2-0 CT2 27 (SUTURE) ×4 IMPLANT
SUTURE MNCRL 4-0 27XMF (SUTURE) ×1 IMPLANT
SYR 20ML LL LF (SYRINGE) ×2 IMPLANT
SYR 50ML LL SCALE MARK (SYRINGE) ×2 IMPLANT
TAPE MICROFOAM 4IN (TAPE) ×2 IMPLANT
TAPE TRANSPORE STRL 2 31045 (GAUZE/BANDAGES/DRESSINGS) ×2 IMPLANT
TIP BRUSH PULSAVAC PLUS 24.33 (MISCELLANEOUS) ×2 IMPLANT
TIP FAN IRRIG PULSAVAC PLUS (DISPOSABLE) ×1 IMPLANT
TUBE KAMVAC SUCTION (TUBING) ×2 IMPLANT
TUBE SUCT KAM VAC (TUBING) ×2 IMPLANT

## 2020-11-11 NOTE — Anesthesia Procedure Notes (Signed)
Procedure Name: Intubation Date/Time: 11/11/2020 1:44 PM Performed by: Tollie Eth, CRNA Pre-anesthesia Checklist: Patient identified, Patient being monitored, Timeout performed, Emergency Drugs available and Suction available Patient Re-evaluated:Patient Re-evaluated prior to induction Oxygen Delivery Method: Circle system utilized Preoxygenation: Pre-oxygenation with 100% oxygen Induction Type: IV induction Ventilation: Mask ventilation without difficulty Laryngoscope Size: 3 and McGraph Grade View: Grade I Tube type: Oral Tube size: 6.5 mm Number of attempts: 1 Airway Equipment and Method: Stylet and Video-laryngoscopy Placement Confirmation: ETT inserted through vocal cords under direct vision,  positive ETCO2 and breath sounds checked- equal and bilateral Secured at: 21 cm Tube secured with: Tape Dental Injury: Teeth and Oropharynx as per pre-operative assessment

## 2020-11-11 NOTE — Anesthesia Preprocedure Evaluation (Signed)
Anesthesia Evaluation  Patient identified by MRN, date of birth, ID band Patient awake    Reviewed: Allergy & Precautions, NPO status , Patient's Chart, lab work & pertinent test results  History of Anesthesia Complications Negative for: history of anesthetic complications  Airway Mallampati: III       Dental  (+) Missing, Chipped, Poor Dentition   Pulmonary neg sleep apnea, neg COPD, Not current smoker, former smoker,           Cardiovascular hypertension, Pt. on medications + Past MI and + Cardiac Stents  (-) CHF + dysrhythmias Atrial Fibrillation (-) Valvular Problems/Murmurs     Neuro/Psych Anxiety Depression Dementia TIACVA    GI/Hepatic Neg liver ROS, neg GERD  ,  Endo/Other  diabetes, Type 2, Oral Hypoglycemic Agents  Renal/GU negative Renal ROS     Musculoskeletal   Abdominal   Peds  Hematology   Anesthesia Other Findings   Reproductive/Obstetrics                             Anesthesia Physical Anesthesia Plan  ASA: III and emergent  Anesthesia Plan: General   Post-op Pain Management:    Induction: Intravenous  PONV Risk Score and Plan: 3 and Ondansetron and Treatment may vary due to age or medical condition  Airway Management Planned: Oral ETT  Additional Equipment:   Intra-op Plan:   Post-operative Plan:   Informed Consent: I have reviewed the patients History and Physical, chart, labs and discussed the procedure including the risks, benefits and alternatives for the proposed anesthesia with the patient or authorized representative who has indicated his/her understanding and acceptance.     Consent reviewed with POA  Plan Discussed with:   Anesthesia Plan Comments:         Anesthesia Quick Evaluation

## 2020-11-11 NOTE — Progress Notes (Signed)
PROGRESS NOTE    Shawna Lynch  SNK:[id number] DOB: [birth year] DOA: 11/10/2020 PCP: Philmore Pali, NP    Brief Narrative:  86 y.o. Caucasian female with medical history significant for osteoarthritis, anxiety, coronary artery disease status post PCI and stent, dementia, depression, type II hypertension and TIA, who presented to the emergency room with acute onset of right hip pain after having a mechanical fall tripping on her dog and falling on her right hip.  Her family noted her right leg was shortened.  Her pain has been severe and getting worse with any movement.  It became better after 200 mcg of IV fentanyl given by EMS.  No reported nausea or vomiting or abdominal pain.  No reported chest pain or palpitations.  No loss of consciousness or presyncope.  No paresthesias or focal muscle weakness.  In the ED, pt was found to have R mid-femoral neck fracture. Orthopedic Surgery consulted  Assessment & Plan:   Active Problems:   Closed right hip fracture (HCC)  1.  Closed right hip fracture secondary to mechanical fall. -Aspirin and Plavix held at time of presentation -She has not been taking her Brilinta for about a month. -Orthopedic Surgery consulted at time of presentation. -Pt is now s/p R hip arthoplasty on 2/13  2.  Hypertensive urgency. -Presenting sbp in the 200's, likely secondary to her pain. -Home antihypertensives continued. BP currently stable and controlled  3.  Coronary artery disease status post PCI and stent.  We will continue her as needed sublingual  -continue on nitroglycerin, beta-blocker therapy with Coreg, statin therapy  - aspirin and Plavix were held at time of presentation  4.  Type 2 diabetes mellitus without complications. -We will hold off her Metformin while in hospital -continue on SSI coverage as needed  5.  Depression. -Continued on home Lexapro and Depakote.  6.  Dyslipidemia. -We will continue statin therapy.  7.  Vitamin B12  deficiency. -We will continue her vitamin B12.   DVT prophylaxis: SCD's Code Status: DNR Family Communication: Pt in room  Status is: Inpatient  Remains inpatient appropriate because:Unsafe d/c plan and Inpatient level of care appropriate due to severity of illness   Dispo: The patient is from: Home              Anticipated d/c is to: Unknown - will need PT/OT eval post-op              Anticipated d/c date is: 2 days              Patient currently is not medically stable to d/c.   Difficult to place patient No  Consultants:   Orthopedic Surgery  Procedures:   R hip arthoplasty 2/13  Antimicrobials: Anti-infectives (From admission, onward)   Start     Dose/Rate Route Frequency Ordered Stop   11/11/20 1815  ceFAZolin (ANCEF) IVPB 1 g/50 mL premix        1 g 100 mL/hr over 30 Minutes Intravenous Every 6 hours 11/11/20 1729 11/12/20 0729   11/11/20 0000  ceFAZolin (ANCEF) IVPB 2g/100 mL premix  Status:  Discontinued        2 g 200 mL/hr over 30 Minutes Intravenous Every 8 hours 11/10/20 1912 11/11/20 1729       Subjective: Without complaints  Objective: Vitals:   11/11/20 1645 11/11/20 1711 11/11/20 1725 11/11/20 1806  BP: (!) 151/78 140/83 133/82 110/71  Pulse: 96 96 98 (!) 102  Resp: 10 13 16 20   Temp:   [name])  97.4 F (36.3 C) 98.6 F (37 C)  TempSrc:    Oral  SpO2: 92% 97% 94% 90%  Weight:      Height:        Intake/Output Summary (Last 24 hours) at 11/11/2020 1817 Last data filed at 11/11/2020 1556 Gross per 24 hour  Intake 1100 ml  Output 1050 ml  Net 50 ml   Filed Weights   11/10/20 1743 11/10/20 2113  Weight: 65.8 kg 56.2 kg    Examination:  General exam: Appears calm and comfortable  Respiratory system: Clear to auscultation. Respiratory effort normal. Cardiovascular system: S1 & S2 heard, Regular Gastrointestinal system: Abdomen is nondistended, soft and nontender. No organomegaly or masses felt. Normal bowel sounds heard. Central nervous  system: Alert. No focal neurological deficits. Extremities: Symmetric 5 x 5 power. Skin: No rashes, lesions  Psychiatry: difficult to assess given mentation   Data Reviewed: I have personally reviewed following labs and imaging studies  CBC: Recent Labs  Lab 11/10/20 1753 11/11/20 0501  WBC 9.9 10.2  NEUTROABS 7.0  --   HGB 11.9* 11.6*  HCT 35.7* 34.6*  MCV 90.4 90.6  PLT 238 086   Basic Metabolic Panel: Recent Labs  Lab 11/10/20 1753 11/11/20 0501  NA 137 136  K 3.9 3.4*  CL 99 100  CO2 26 27  GLUCOSE 201* 154*  BUN 15 13  CREATININE 0.84 0.74  CALCIUM 9.4 8.8*   GFR: Estimated Creatinine Clearance: 46.4 mL/min (by C-G formula based on SCr of 0.74 mg/dL). Liver Function Tests: No results for input(s): AST, ALT, ALKPHOS, BILITOT, PROT, ALBUMIN in the last 168 hours. No results for input(s): LIPASE, AMYLASE in the last 168 hours. No results for input(s): AMMONIA in the last 168 hours. Coagulation Profile: Recent Labs  Lab 11/10/20 1753  INR 1.2   Cardiac Enzymes: No results for input(s): CKTOTAL, CKMB, CKMBINDEX, TROPONINI in the last 168 hours. BNP (last 3 results) No results for input(s): PROBNP in the last 8760 hours. HbA1C: No results for input(s): HGBA1C in the last 72 hours. CBG: Recent Labs  Lab 11/11/20 0010 11/11/20 0731 11/11/20 1141 11/11/20 1610 11/11/20 1732  GLUCAP 159* 127* 142* 184* 201*   Lipid Profile: No results for input(s): CHOL, HDL, LDLCALC, TRIG, CHOLHDL, LDLDIRECT in the last 72 hours. Thyroid Function Tests: No results for input(s): TSH, T4TOTAL, FREET4, T3FREE, THYROIDAB in the last 72 hours. Anemia Panel: No results for input(s): VITAMINB12, FOLATE, FERRITIN, TIBC, IRON, RETICCTPCT in the last 72 hours. Sepsis Labs: No results for input(s): PROCALCITON, LATICACIDVEN in the last 168 hours.  Recent Results (from the past 240 hour(s))  Resp Panel by RT-PCR (Flu A&B, Covid) Nasopharyngeal Swab     Status: None   Collection  Time: 11/10/20  6:20 PM   Specimen: Nasopharyngeal Swab; Nasopharyngeal(NP) swabs in vial transport medium  Result Value Ref Range Status   SARS Coronavirus 2 by RT PCR NEGATIVE NEGATIVE Final    Comment: (NOTE) SARS-CoV-2 target nucleic acids are NOT DETECTED.  The SARS-CoV-2 RNA is generally detectable in upper respiratory specimens during the acute phase of infection. The lowest concentration of SARS-CoV-2 viral copies this assay can detect is 138 copies/mL. A negative result does not preclude SARS-Cov-2 infection and should not be used as the sole basis for treatment or other patient management decisions. A negative result may occur with  improper specimen collection/handling, submission of specimen other than nasopharyngeal swab, presence of viral mutation(s) within the areas targeted by this assay, and inadequate  number of viral copies(<138 copies/mL). A negative result must be combined with clinical observations, patient history, and epidemiological information. The expected result is Negative.  Fact Sheet for Patients:  EntrepreneurPulse.com.au  Fact Sheet for Healthcare Providers:  IncredibleEmployment.be  This test is no t yet approved or cleared by the [place] FDA and  has been authorized for detection and/or diagnosis of SARS-CoV-2 by FDA under an Emergency Use Authorization (EUA). This EUA will remain  in effect (meaning this test can be used) for the duration of the COVID-19 declaration under Section 564(b)(1) of the Act, 21 U.S.C.section 360bbb-3(b)(1), unless the authorization is terminated  or revoked sooner.       Influenza A by PCR NEGATIVE NEGATIVE Final   Influenza B by PCR NEGATIVE NEGATIVE Final    Comment: (NOTE) The Xpert Xpress SARS-CoV-2/FLU/RSV plus assay is intended as an aid in the diagnosis of influenza from Nasopharyngeal swab specimens and should not be used as a sole basis for treatment. Nasal washings  and aspirates are unacceptable for Xpert Xpress SARS-CoV-2/FLU/RSV testing.  Fact Sheet for Patients: EntrepreneurPulse.com.au  Fact Sheet for Healthcare Providers: IncredibleEmployment.be  This test is not yet approved or cleared by the [place] FDA and has been authorized for detection and/or diagnosis of SARS-CoV-2 by FDA under an Emergency Use Authorization (EUA). This EUA will remain in effect (meaning this test can be used) for the duration of the COVID-19 declaration under Section 564(b)(1) of the Act, 21 U.S.C. section 360bbb-3(b)(1), unless the authorization is terminated or revoked.  Performed at [facility], [address] [place], [place] [address]      Radiology Studies: DG Pelvis Portable  Result Date: 11/11/2020 CLINICAL DATA:  Hip replacement EXAM: PORTABLE PELVIS 1-2 VIEWS COMPARISON:  X-ray earlier in the same day FINDINGS: The patient has undergone total hip arthroplasty on the right. There are expected postsurgical changes. The alignment is near anatomic. There are mild degenerative changes of the left hip. There is osteopenia. IMPRESSION: Expected postsurgical changes related to total hip arthroplasty on the right. Electronically Signed   By: Constance Holster M.D.   On: 11/11/2020 16:50   DG Pelvis Portable  Result Date: 11/11/2020 CLINICAL DATA:  Portable x-ray in the OR EXAM: PORTABLE PELVIS 1-2 VIEWS COMPARISON:  11/10/2020 FINDINGS: The femoral component of a total hip arthroplasty on the right is noted. There are expected postsurgical changes. The acetabular component is not visualized. A sponge is noted. IMPRESSION: Intraoperative x-ray as detailed above. Electronically Signed   By: Constance Holster M.D.   On: 11/11/2020 15:14   DG Chest Portable 1 View  Result Date: 11/10/2020 CLINICAL DATA:  Pt to ED from home via [facility] for mechanical fall. Pt tripped over dog. Pt did not hit lead or lose  consciousness according to family. Pt in bed. Pt has baseline dementia per EMS. Pt not answering questions. Pt has right leg shortening and external rotation. C collar is in place, history of arthritis, CAD, diabetes, hypertension, TIA, NSTEMI, former smoker EXAM: PORTABLE CHEST 1 VIEW COMPARISON:  08/19/2020 FINDINGS: Cardiac silhouette is normal in size. No mediastinal or hilar masses. Clear lungs.  No pleural effusion or pneumothorax. Skeletal structures are demineralized but grossly intact. IMPRESSION: No active disease. Electronically Signed   By: Lajean Manes M.D.   On: 11/10/2020 18:24   DG Hip Unilat W or Wo Pelvis 2-3 Views Right  Result Date: 11/10/2020 CLINICAL DATA:  Pt to ED from home via [facility] for mechanical fall.  Pt tripped over dog. Pt did not hit lead or lose consciousness according to family. Pt in bed. Pt has baseline dementia per EMS. Pt not answering questions. Pt has right leg shortening and external rotation. C collar is in place, history of arthritis, CAD, diabetes, hypertension, TIA, NSTEMI, former smoker EXAM: DG HIP (WITH OR WITHOUT PELVIS) 2-3V RIGHT COMPARISON:  None. FINDINGS: Mildly displaced fracture of the right femoral neck, across the mid neck, distal fracture component displacing superiorly by approximately 1.3 cm. No significant varus or valgus angulation. No other fractures.  No bone lesions. Hip joints, SI joints and symphysis pubis are normally aligned. Skeletal structures are demineralized. IMPRESSION: Mildly displaced, nonangulated, right mid femoral neck fracture. No dislocation. Electronically Signed   By: Lajean Manes M.D.   On: 11/10/2020 18:23    Scheduled Meds: . acetaminophen  1,000 mg Oral Q8H  . [START ON 11/12/2020] aspirin EC  325 mg Oral Q breakfast  . atorvastatin  40 mg Oral q1800  . carvedilol  6.25 mg Oral BID WC  . [START ON 11/12/2020] clopidogrel  75 mg Oral Daily  . divalproex  250 mg Oral BID  . docusate sodium  100 mg Oral BID  .  escitalopram  10 mg Oral Daily  . insulin glargine  14 Units Subcutaneous QHS  . ketorolac  7.5 mg Intravenous Q6H  . traMADol  50 mg Oral Q6H  . vitamin B-12  1,000 mcg Oral Daily   Continuous Infusions: . sodium chloride    .  ceFAZolin (ANCEF) IV       LOS: 1 day   Marylu Lund, MD [facility] Pager On Amion  If 7PM-7AM, please contact night-coverage 11/11/2020, 6:17 PM

## 2020-11-11 NOTE — Transfer of Care (Signed)
Immediate Anesthesia Transfer of Care Note  Patient: Shawna Lynch  Procedure(s) Performed: ARTHROPLASTY BIPOLAR HIP (HEMIARTHROPLASTY) (Right Hip)  Patient Location: PACU  Anesthesia Type:General  Level of Consciousness: drowsy  Airway & Oxygen Therapy: Patient Spontanous Breathing and Patient connected to face mask oxygen  Post-op Assessment: Report given to RN and Post -op Vital signs reviewed and stable  Post vital signs: Reviewed and stable  Last Vitals:  Vitals Value Taken Time  BP 153/77 11/11/20 1553  Temp    Pulse 93 11/11/20 1556  Resp 16 11/11/20 1556  SpO2 100 % 11/11/20 1556  Vitals shown include unvalidated device data.  Last Pain:  Vitals:   11/11/20 0017  TempSrc: Oral         Complications: No complications documented.

## 2020-11-11 NOTE — Consult Note (Signed)
ORTHOPAEDIC CONSULTATION  REQUESTING PHYSICIAN: Donne Hazel, MD  Chief Complaint:   R hip pain  History of Present Illness: Shawna Lynch is a 86 y.o. female who had a fall yesterday after tripping over her dog.  The patient noted immediate hip pain and inability to ambulate.  The patient ambulates unassisted at baseline.  The patient lives alone but has 24 hour care. Pain is described as sharp at its worst and a dull ache at its best.  Pain is improved with rest and immobilization.  Pain is worse with any sort of movement.  X-rays in the emergency department show a right displaced femoral neck fracture.  Of note, she has a medical history significant for dementia, CAD s/p stent placement, DM, HTN. Per daughter, patient has not been taking Brilinta for the past month. She has been taking Plavix.   Past Medical History:  Diagnosis Date  . Anxiety   . Arthritis    oa  . Coronary artery disease    04/06/17 PCI with DES to [facility], 90% in diag from LAD, nonobstructive disease in LAD, RCA. Normal EF.   [name] Dementia ([place])   . Depression   . Diabetes mellitus without complication ([place])   . Hypertension   . NSTEMI (non-ST elevated myocardial infarction) ([place])   . Ringing in ears   . TIA (transient ischemic attack)    Past Surgical History:  Procedure Laterality Date  . ABDOMINAL HYSTERECTOMY    . CORONARY STENT INTERVENTION  04/06/2017  . CORONARY STENT INTERVENTION N/A 04/06/2017   Procedure: Coronary Stent Intervention;  Surgeon: Sherren Mocha, MD;  Location: [place] [facility];  Service: Cardiovascular;  Laterality: N/A;  . LEFT HEART CATH AND CORONARY ANGIOGRAPHY N/A 04/06/2017   Procedure: Left Heart Cath and Coronary Angiography;  Surgeon: Sherren Mocha, MD;  Location: [place] [facility];  Service: Cardiovascular;  Laterality: N/A;   Social History   Socioeconomic History  . Marital status: Married    Spouse name: Not  on file  . Number of children: 2  . Years of education: 58  . Highest education level: High school graduate  Occupational History  . Occupation: Retired  Tobacco Use  . Smoking status: Former Research scientist (life sciences)  . Smokeless tobacco: Never Used  . Tobacco comment: quit "20 years ago"  Vaping Use  . Vaping Use: Never used  Substance and Sexual Activity  . Alcohol use: No  . Drug use: No  . Sexual activity: Not on file  Other Topics Concern  . Not on file  Social History Narrative   Lives alone but daughter is right across the street.   Right-handed.   No caffeine use.   Social Determinants of Health   Financial Resource Strain: Not on file  Food Insecurity: Not on file  Transportation Needs: Not on file  Physical Activity: Not on file  Stress: Not on file  Social Connections: Not on file   Family History  Problem Relation Age of Onset  . CAD Father   . Cancer Sister   . Hearing loss Mother   . Hypertension Mother    Allergies  Allergen Reactions  . Penicillins Hives  . Sulfa Antibiotics Rash  . Vicodin [Hydrocodone-Acetaminophen] Nausea And Vomiting  . Codeine Rash   Prior to Admission medications   Medication Sig Start Date End Date Taking? Authorizing Provider  aspirin EC 81 MG EC tablet Take 1 tablet (81 mg total) by mouth daily. 04/07/17  Yes [name], NP  atorvastatin (LIPITOR) 80 MG tablet Take 0.5 tablets (40 mg total) by mouth daily at 6 PM. 08/24/20  Yes Biby, Massie Kluver, NP  carvedilol (COREG) 6.25 MG tablet Take 6.25 mg by mouth 2 (two) times daily. 10/18/20  Yes [provider]  clopidogrel (PLAVIX) 75 MG tablet Take 75 mg by mouth daily. 09/26/20  Yes [provider]  divalproex (DEPAKOTE) 250 MG DR tablet Take 1 tablet (250 mg total) by mouth 2 (two) times daily. 09/12/20  Yes Marcial Pacas, MD  escitalopram (LEXAPRO) 10 MG tablet Take 10 mg by mouth daily. 06/25/20  Yes [provider]  LANTUS SOLOSTAR 100 UNIT/ML Solostar Pen Inject  14 Units into the skin at bedtime. 06/17/20  Yes [provider]  metFORMIN (GLUCOPHAGE) 500 MG tablet Take 500 mg by mouth daily.  08/20/19  Yes [provider]  cholecalciferol (VITAMIN D) 1000 units tablet Take 1,000 Units by mouth daily. Patient not taking: Reported on 11/10/2020    [provider]  nitroGLYCERIN (NITROSTAT) 0.4 MG SL tablet Place 1 tablet (0.4 mg total) under the tongue every 5 (five) minutes x 3 doses as needed for chest pain. Patient taking differently: Place 0.4 mg under the tongue every 5 (five) minutes as needed for chest pain (max 3 doses). 04/07/17   [name], NP  omega-3 acid ethyl esters (LOVAZA) 1 g capsule Take 1 capsule (1 g total) by mouth 2 (two) times daily. Patient not taking: Reported on 11/10/2020 04/27/17   Erlene Quan, PA-C  ondansetron [facility]) 4 MG tablet Take 1 tablet (4 mg total) by mouth daily as needed for nausea or vomiting. 09/19/20   Harvest Dark, MD  ticagrelor (BRILINTA) 90 MG TABS tablet Take 1 tablet (90 mg total) by mouth 2 (two) times daily. Patient not taking: Reported on 11/10/2020 08/14/20   Delora Fuel, MD  TRULICITY 1.5 GY/6.5LD SOPN Inject 1.5 mg into the skin once a week. Monday 10/18/20   [provider]  vitamin B-12 (CYANOCOBALAMIN) 1000 MCG tablet Take 1,000 mcg by mouth daily. Patient not taking: Reported on 11/10/2020    [provider]   Recent Labs    11/10/20 1753 11/11/20 0501  WBC 9.9 10.2  HGB 11.9* 11.6*  HCT 35.7* 34.6*  PLT 238 202  K 3.9 3.4*  CL 99 100  CO2 26 27  BUN 15 13  CREATININE 0.84 0.74  GLUCOSE 201* 154*  CALCIUM 9.4 8.8*  INR 1.2  --    DG Chest Portable 1 View  Result Date: 11/10/2020 CLINICAL DATA:  Pt to ED from home via [facility] for mechanical fall. Pt tripped over dog. Pt did not hit lead or lose consciousness according to family. Pt in bed. Pt has baseline dementia per EMS. Pt not answering questions. Pt has right leg  shortening and external rotation. C collar is in place, history of arthritis, CAD, diabetes, hypertension, TIA, NSTEMI, former smoker EXAM: PORTABLE CHEST 1 VIEW COMPARISON:  08/19/2020 FINDINGS: Cardiac silhouette is normal in size. No mediastinal or hilar masses. Clear lungs.  No pleural effusion or pneumothorax. Skeletal structures are demineralized but grossly intact. IMPRESSION: No active disease. Electronically Signed   By: Lajean Manes M.D.   On: 11/10/2020 18:24   DG Hip Unilat W or Wo Pelvis 2-3 Views Right  Result Date: 11/10/2020 CLINICAL DATA:  Pt to ED from home via [facility] for mechanical fall. Pt tripped over dog. Pt did not hit lead or lose consciousness according  to family. Pt in bed. Pt has baseline dementia per EMS. Pt not answering questions. Pt has right leg shortening and external rotation. C collar is in place, history of arthritis, CAD, diabetes, hypertension, TIA, NSTEMI, former smoker EXAM: DG HIP (WITH OR WITHOUT PELVIS) 2-3V RIGHT COMPARISON:  None. FINDINGS: Mildly displaced fracture of the right femoral neck, across the mid neck, distal fracture component displacing superiorly by approximately 1.3 cm. No significant varus or valgus angulation. No other fractures.  No bone lesions. Hip joints, SI joints and symphysis pubis are normally aligned. Skeletal structures are demineralized. IMPRESSION: Mildly displaced, nonangulated, right mid femoral neck fracture. No dislocation. Electronically Signed   By: Lajean Manes M.D.   On: 11/10/2020 18:23     Positive ROS: All other systems have been reviewed and were otherwise negative with the exception of those mentioned in the HPI and as above.  Physical Exam: BP (!) 145/101 (BP Location: Left Arm)   Pulse (!) 107   Temp 98.4 F (36.9 C)   Resp 20   Ht 5\' 6"  (1.676 m)   Wt 56.2 kg   SpO2 95%   BMI 20.00 kg/m  General:  Alert, no acute distress Psychiatric:  Patient is NOT competent for consent, non-agitated    Cardiovascular:  No pedal edema, regular rate and rhythm Respiratory:  No wheezing, non-labored breathing GI:  Abdomen is soft and non-tender Skin:  No lesions in the area of chief complaint, no erythema Neurologic:  Sensation intact distally, CN grossly intact Lymphatic:  No axillary or cervical lymphadenopathy   Orthopedic Exam:  RLE: + DF/PF/EHL SILT grossly over foot Foot wwp +Log roll/axial load Leg shortened and externally rotated   X-rays:  As above: R displaced femoral neck fracture  Assessment/Plan: Shawna Lynch is a 86 y.o. female with a R displaced femoral neck fracture 1. I discussed the various treatment options including both surgical and non-surgical management of the fracture with the patient and/or family (medical PoA). We discussed the high risk of perioperative complications due to patient's age, dementia, and other co-morbidities. After discussion of risks, benefits, and alternatives to surgery, the family and/or patient were in agreement to proceed with surgery. The goals of surgery would be to provide adequate pain relief and allow for mobilization. Plan for surgery is R hip hemiarthroplasty today, 11/11/2020. 2. NPO until OR 3. Hold anticoagulation in advance of OR    Leim Fabry   11/11/2020 8:47 AM

## 2020-11-11 NOTE — Progress Notes (Signed)
Pt still NPO this am. Very tearful but demises pain at this time. Pt waiting to go to surgery this afternoon. Daughter at bedside.

## 2020-11-11 NOTE — Progress Notes (Signed)
Pt in bed resting quietly at this time. Gave prn Pain medication and nausea as ordered see mar. Daughter at bedside was able to ask daughter all admission questions for pt related to pt has hx of dementia and not able to answer questions for self. Incont of BnB. Periwick in place. Still NPO waiting to go to surgery.

## 2020-11-11 NOTE — H&P (Signed)
H&P reviewed. No significant changes noted.  

## 2020-11-11 NOTE — Op Note (Signed)
DATE OF SURGERY: 11/11/2020  PREOPERATIVE DIAGNOSIS: Right femoral neck fracture  POSTOPERATIVE DIAGNOSIS: Right femoral neck fracture  PROCEDURE: Right hip hemiarthroplasty  SURGEON: Cato Mulligan, MD  ASSISTANTS:   ANESTHESIA: Gen  EBL: 300 cc  COMPONENTS:  Stryker - Accolade C Size 5 Stem Stryker - Unitrax 59mm head with +4 offset neck   INDICATIONS: Shawna Lynch is a 86 y.o. female who sustained a displaced femoral neck fracture after a fall. Risks and benefits of hip hemiarthroplasty were explained to the patient and/or family. Risks include but are not limited to bleeding, infection, injury to tissues, nerves, vessels, periprosthetic infection, dislocation, limb length discrepancy and risks of anesthesia. The patient and/or family understands these risks, has completed an informed consent and wishes to proceed.   PROCEDURE:  The patient was identified in the preoperative holding area and the operative extremity was marked.  The patient was then transferred to the operating room suite and mobilized from the hospital gurney to the operating room table. Anesthesia was administered without complication. The patient was then transitioned to a lateral position.  All bony prominences were padded per protocol.  An axillary roll was placed.  Careful attention was paid to the contralateral side peroneal nerve, which was free from pressure with use of appropriate padding and blankets. A time-out was performed to confirm the patient's identity and the correct laterality of surgery. The patient was then prepped and draped in the usual sterile fashion. Appropriate pre-operative antibiotics were administered. Tranexamic acid was administered preoperatively.    An incision that centered on the posterior tip of the greater trochanter with a posterior curve was made. Dissection was carried down through the subcutaneous tissue.  Careful attention was made to maintain hemostasis using electrocautery.   Dissection brought [place] to the level of the deep fascia where the gluteus maximus muscle and proximal portion of the IT band were identified.  The proximal region of the IT band was incised in linear fashion and this incision was extended proximally in a curvilinear fashion to split the gluteus maximus muscle parallel to its fibers to minimize bleeding.  This was accomplished using a combination bovie electrocautery as well as blunt dissection.  The trochanteric bursa was then visualized and dissected from anterior to posterior. A blunt homan retractor was placed beneath the abductors. The piriformis tendon and short external rotators were visualized. Bovie electrocautery was used to cut these with the capsule as one L-shaped flap. This was tagged at the corner with #5 Ethibond. At this point, the femoral neck fracture was visualized. An oscillating saw was used to make a new neck cut approximately 18mm about the lesser tuberosity with the use of a neck cut guide. The head was then freed from its remaining soft tissue attachments and measured. The head trial was then inserted into the acetabulum and sized appropriately.    We then turned our attention to preparing the femoral canal. First, a box cut was performed utilizing the box osteotome. A canal finder was inserted by hand and sequential broaching was then performed. The calcar planer was inserted onto the broach and used to smooth the calcar appropriately.  A trial stem, neutral neck, and head were inserted into the acetabulum and placed through range of motion. Intraoperative radiographs were taken to assess hardware position and leg lengths. The hip was again dislocated and the femoral trial components were removed. An appropriately sized cement restrictor was placed. The canal was irrigated with pulse lavage and dried. A cement  gun was used to place cement into the femoral canal. Cement was then pressurized. The actual stem was inserted into the femoral  canal and then driven onto the calcar. Position was held until the cement hardened. The trial head was then again inserted on the femoral component and found to be appropriate. Trial was removed and the permanent head/neck was Morse tapered onto the femoral stem and then reduced into the acetabulum.    The hip stability and length were reassessed and found to be satisfactory.  The wound was then copiously irrigated with normal saline solution. The tagged sutures of the capsule and piriformis were sewn to the gluteus medius tendon. This adequately closed the hip capsule. The IT band and gluteus maximus fascia were then closed with 0-Vicryl in a running, locked fashion. A mixture of Exparil and Sensoricaine was administered.  The subdermal layer was closed with 2-0 Vicryl in a buried interrupted fashion. Skin was approximated with staples.  Sterile dressing was applied. An abduction pillow was placed. The patient was mobilized from the lateral position back to supine on the operating room table and then awakened from general anesthesia without complication.   POSTOPERATIVE PLAN: The patient will be WBAT on operative extremity. Lovenox 40mg /day x 4 weeks to start on POD#1. IV Abx x 24 hours. PT/OT on POD#1. Posterior hip precautions.

## 2020-11-11 NOTE — Anesthesia Postprocedure Evaluation (Signed)
Anesthesia Post Note  Patient: Shawna Lynch  Procedure(s) Performed: ARTHROPLASTY BIPOLAR HIP (HEMIARTHROPLASTY) (Right Hip)  Patient location during evaluation: PACU Anesthesia Type: General Level of consciousness: awake and alert Pain management: pain level controlled Vital Signs Assessment: post-procedure vital signs reviewed and stable Respiratory status: spontaneous breathing and respiratory function stable Cardiovascular status: stable Anesthetic complications: no   No complications documented.   Last Vitals:  Vitals:   11/11/20 0730 11/11/20 1111  BP: (!) 145/101 138/65  Pulse: (!) 107 98  Resp: 20 18  Temp: 36.9 C 37.6 C  SpO2: 95% 95%    Last Pain:  Vitals:   11/11/20 0017  TempSrc: Oral                 Valena Ivanov K

## 2020-11-12 ENCOUNTER — Encounter: Payer: Self-pay | Admitting: Orthopedic Surgery

## 2020-11-12 LAB — BASIC METABOLIC PANEL
Anion gap: 7 (ref 5–15)
BUN: 21 mg/dL (ref 8–23)
CO2: 22 mmol/L (ref 22–32)
Calcium: 7.7 mg/dL — ABNORMAL LOW (ref 8.9–10.3)
Chloride: 105 mmol/L (ref 98–111)
Creatinine, Ser: 1.24 mg/dL — ABNORMAL HIGH (ref 0.44–1.00)
GFR, Estimated: 43 mL/min — ABNORMAL LOW (ref >60)
Glucose, Bld: 174 mg/dL — ABNORMAL HIGH (ref 70–99)
Potassium: 3.7 mmol/L (ref 3.5–5.1)
Sodium: 134 mmol/L — ABNORMAL LOW (ref 135–145)

## 2020-11-12 LAB — CBC
HCT: 23.8 % — ABNORMAL LOW (ref 36.0–46.0)
Hemoglobin: 8 g/dL — ABNORMAL LOW (ref 12.0–15.0)
MCH: 31.1 pg (ref 26.0–34.0)
MCHC: 33.6 g/dL (ref 30.0–36.0)
MCV: 92.6 fL (ref 80.0–100.0)
Platelets: 172 10*3/uL (ref 150–400)
RBC: 2.57 MIL/uL — ABNORMAL LOW (ref 3.87–5.11)
RDW: 14.1 % (ref 11.5–15.5)
WBC: 14.2 10*3/uL — ABNORMAL HIGH (ref 4.0–10.5)
nRBC: 0 % (ref 0.0–0.2)

## 2020-11-12 LAB — GLUCOSE, CAPILLARY
Glucose-Capillary: 145 mg/dL — ABNORMAL HIGH (ref 70–99)
Glucose-Capillary: 169 mg/dL — ABNORMAL HIGH (ref 70–99)

## 2020-11-12 MED ORDER — SODIUM CHLORIDE 0.9 % IV SOLN: INTRAVENOUS | Status: DC

## 2020-11-12 MED ORDER — TRAMADOL HCL 50 MG PO TABS: 50.0000 mg | ORAL_TABLET | Freq: Four times a day (QID) | ORAL | 0 refills | Status: AC

## 2020-11-12 MED ORDER — OXYCODONE HCL 5 MG PO TABS: 2.5000 mg | ORAL_TABLET | ORAL | 0 refills | Status: DC | PRN

## 2020-11-12 NOTE — Plan of Care (Signed)
Pt alert and oriented to self only. Pt has no foley catheter in place at this time. Pt with abduction pillow. NS at 45ml/hr. Pt has an external cath. Bed alarm on, call bell within reach Problem: Activity: Goal: Risk for activity intolerance will decrease Outcome: Progressing   Problem: Coping: Goal: Level of anxiety will decrease Outcome: Progressing   Problem: Elimination: Goal: Will not experience complications related to bowel motility Outcome: Progressing   Problem: Pain Managment: Goal: General experience of comfort will improve Outcome: Progressing

## 2020-11-12 NOTE — TOC Initial Note (Signed)
Transition of Care [facility]) - Initial/Assessment Note    Patient Details  Name: Shawna Lynch MRN: [id number] Date of Birth: October 14, 1934  Transition of Care [facility]) CM/SW Contact:    Shelbie Ammons, RN Phone Number: 11/12/2020, 3:34 PM  Clinical Narrative: RNCM met with patient and daughter in room. Patient alert and smiled when this CM walked into room but did not attempt to communicate other than waving. Daughter [name] reports that patient has around the clock care at home but that this had been becoming more difficult and they have actually been looking for a memory care unit prior to her hospitalization. She does not think at this point patient could be managed at home. She reports that she has been in contact with both [facility] and [facility]. Encouraged daughter reach out to both facilities and attempt to make decision to see if they have immediate beds available. She reports she will follow up on this.                  Expected Discharge Plan: Memory Care Barriers to Discharge: No Barriers Identified   Patient Goals and CMS Choice        Expected Discharge Plan and Services Expected Discharge Plan: Memory Care       Living arrangements for the past 2 months: Single Family Home                                      Prior Living Arrangements/Services Living arrangements for the past 2 months: Single Family Home Lives with:: Adult Children Patient language and need for interpreter reviewed:: Yes        Need for Family Participation in Patient Care: Yes (Comment) Care giver support system in place?: Yes (comment)   Criminal Activity/Legal Involvement Pertinent to Current Situation/Hospitalization: No - Comment as needed  Activities of Daily Living Home Assistive Devices/Equipment: None ADL Screening (condition at time of admission) Patient's cognitive ability adequate to safely complete daily activities?: No Is the patient deaf or have difficulty hearing?: No Does  the patient have difficulty seeing, even when wearing glasses/contacts?: No Does the patient have difficulty concentrating, remembering, or making decisions?: Yes Patient able to express need for assistance with ADLs?: No Does the patient have difficulty dressing or bathing?: Yes Independently performs ADLs?: No Communication: Independent Dressing (OT): Needs assistance Is this a change from baseline?: Pre-admission baseline Grooming: Needs assistance Is this a change from baseline?: Pre-admission baseline Feeding: Independent Bathing: Needs assistance Is this a change from baseline?: Pre-admission baseline Toileting: Dependent Is this a change from baseline?: Pre-admission baseline In/Out Bed: Dependent Is this a change from baseline?: Pre-admission baseline Walks in Home: Needs assistance Is this a change from baseline?: Pre-admission baseline Does the patient have difficulty walking or climbing stairs?: Yes Weakness of Legs: Both Weakness of Arms/Hands: Both  Permission Sought/Granted Permission sought to share information with : Family Supports                Emotional Assessment         Alcohol / Substance Use: Not Applicable Psych Involvement: No (comment)  Admission diagnosis:  Closed right hip fracture ([place]) [S72.001A] Other closed fracture of shaft of right femur, initial encounter ([place]) [S72.391A] Patient Active Problem List   Diagnosis Date Noted  . Closed right hip fracture ([place]) 11/10/2020  . Vascular dementia without behavioral disturbance ([place]) 09/12/2020  . Depression 09/12/2020  .  Acute ischemic stroke ([place]) - R brain stroke not seen on imaging 08/19/2020  . TIA (transient ischemic attack) 08/13/2020  . Mild cognitive impairment 03/09/2018  . Pruritus 03/09/2018  . CAD S/P percutaneous coronary angioplasty 04/27/2017  . Dementia ([place]) 04/27/2017  . Dyslipidemia 04/07/2017  . Non-insulin treated type 2 diabetes mellitus ([place]) 04/06/2017  .  Essential hypertension 04/06/2017  . History of non-ST elevation myocardial infarction (NSTEMI) 04/03/2017   PCP:  Philmore Pali, NP Pharmacy:   [id number] [id number]: [phone]: 3[facility] [id number]: [phone]: [phone]    Social Determinants of Health (SDOH) Interventions    Readmission Risk Interventions No flowsheet data found.

## 2020-11-12 NOTE — Discharge Instructions (Signed)
INSTRUCTIONS AFTER Surgery  o Remove items at home which could result in a fall. This includes throw rugs or furniture in walking pathways o ICE to the affected joint every three hours while awake for 30 minutes at a time, for at least the first 3-5 days, and then as needed for pain and swelling.  Continue to use ice for pain and swelling. You may notice swelling that will progress down to the foot and ankle.  This is normal after surgery.  Elevate your leg when you are not up walking on it.   o Continue to use the breathing machine you got in the hospital (incentive spirometer) which will help keep your temperature down.  It is common for your temperature to cycle up and down following surgery, especially at night when you are not up moving around and exerting yourself.  The breathing machine keeps your lungs expanded and your temperature down.   DIET:  As you were doing prior to hospitalization, we recommend a well-balanced diet.  DRESSING / WOUND CARE / SHOWERING  Dressing change as needed.  No showering.  Staples will be removed in 2 weeks at [facility] clinic orthopedics.  We will also get x-rays of the right hip at that time.  ACTIVITY  o Increase activity slowly as tolerated, but follow the weight bearing instructions below.   o No driving for 6 weeks or until further direction given by your physician.  You cannot drive while taking narcotics.  o No lifting or carrying greater than 10 lbs. until further directed by your surgeon. o Avoid periods of inactivity such as sitting longer than an hour when not asleep. This helps prevent blood clots.  o You may return to work once you are authorized by your doctor.     WEIGHT BEARING  Weightbearing as tolerated to the right   EXERCISES Gait training and ambulation training.  Strengthening.  Occupational Therapy to do ADLs.  CONSTIPATION  Constipation is defined medically as fewer than three stools per week and severe constipation as less  than one stool per week.  Even if you have a regular bowel pattern at home, your normal regimen is likely to be disrupted due to multiple reasons following surgery.  Combination of anesthesia, postoperative narcotics, change in appetite and fluid intake all can affect your bowels.   YOU MUST use at least one of the following options; they are listed in order of increasing strength to get the job done.  They are all available over the counter, and you may need to use some, POSSIBLY even all of these options:    Drink plenty of fluids (prune juice may be helpful) and high fiber foods Colace 100 mg by mouth twice a day  Senokot for constipation as directed and as needed Dulcolax (bisacodyl), take with full glass of water  Miralax (polyethylene glycol) once or twice a day as needed.  If you have tried all these things and are unable to have a bowel movement in the first 3-4 days after surgery call either your surgeon or your primary doctor.    If you experience loose stools or diarrhea, hold the medications until you stool forms back up.  If your symptoms do not get better within 1 week or if they get worse, check with your doctor.  If you experience "the worst abdominal pain ever" or develop nausea or vomiting, please contact the office immediately for further recommendations for treatment.   ITCHING:  If you experience itching with  your medications, try taking only a single pain pill, or even half a pain pill at a time.  You can also use Benadryl over the counter for itching or also to help with sleep.   TED HOSE STOCKINGS:  Use stockings on both legs until for at least 2 weeks or as directed by physician office. They may be removed at night for sleeping.  MEDICATIONS:  See your medication summary on the After Visit Summary that nursing will review with you.  You may have some home medications which will be placed on hold until you complete the course of blood thinner medication.  It is important  for you to complete the blood thinner medication as prescribed.  PRECAUTIONS:  If you experience chest pain or shortness of breath - call 911 immediately for transfer to the hospital emergency department.   If you develop a fever greater that 101 F, purulent drainage from wound, increased redness or drainage from wound, foul odor from the wound/dressing, or calf pain - CONTACT YOUR SURGEON.                                                   FOLLOW-UP APPOINTMENTS:  If you do not already have a post-op appointment, please call the office for an appointment to be seen by your surgeon.  Guidelines for how soon to be seen are listed in your After Visit Summary, but are typically between 1-4 weeks after surgery.  OTHER INSTRUCTIONS:     MAKE SURE YOU:   Understand these instructions.   Get help right away if you are not doing well or get worse.    Thank you for letting us be a part of your medical care team.  It is a privilege we respect greatly.  We hope these instructions will help you stay on track for a fast and full recovery!

## 2020-11-12 NOTE — Progress Notes (Signed)
PROGRESS NOTE    Shawna Lynch  HGD:[id number] DOB: [birth year] DOA: 11/10/2020 PCP: Philmore Pali, NP    Brief Narrative:  86 y.o. Caucasian female with medical history significant for osteoarthritis, anxiety, coronary artery disease status post PCI and stent, dementia, depression, type II hypertension and TIA, who presented to the emergency room with acute onset of right hip pain after having a mechanical fall tripping on her dog and falling on her right hip.  Her family noted her right leg was shortened.  Her pain has been severe and getting worse with any movement.  It became better after 200 mcg of IV fentanyl given by EMS.  No reported nausea or vomiting or abdominal pain.  No reported chest pain or palpitations.  No loss of consciousness or presyncope.  No paresthesias or focal muscle weakness.  In the ED, pt was found to have R mid-femoral neck fracture. Orthopedic Surgery consulted  Assessment & Plan:   Active Problems:   Closed right hip fracture (HCC)  1.  Closed right hip fracture secondary to mechanical fall. -Aspirin and Plavix held at time of presentation -She has not been taking her Brilinta for about a month. -Orthopedic Surgery consulted at time of presentation. -Pt is now s/p R hip arthoplasty on 2/13  2.  Hypertensive urgency. -Presenting sbp in the 200's, likely secondary to her pain. -Home antihypertensives continued. BP currently stable and controlled  3.  Coronary artery disease status post PCI and stent.  We will continue her as needed sublingual  -continue on nitroglycerin, beta-blocker therapy with Coreg, statin therapy  - aspirin and Plavix were held at time of presentation, would resume when OK with Ortho  4.  Type 2 diabetes mellitus without complications. -We will hold off her Metformin while in hospital -continue on SSI coverage as needed  5.  Depression. -Continued on home Lexapro and Depakote.  6.  Dyslipidemia. -We will continue statin  therapy.  7.  Vitamin B12 deficiency. -We will continue her vitamin B12.  8. Acute blood loss anemia -Hgb down to 8 this AM, from over 11 prior to surgery -Suspect blood loss secondary to surgery -No indication for transfusion now -Repeat cbc in AM  9. ARF -Likely secondary to acute above anemia -Hold nephrotoxic agents -Cont pt on IVF hydration -Repeat bmet in AM   DVT prophylaxis: SCD's Code Status: DNR Family Communication: Pt in room  Status is: Inpatient  Remains inpatient appropriate because:Unsafe d/c plan and Inpatient level of care appropriate due to severity of illness   Dispo: The patient is from: Home              Anticipated d/c is to: Unknown - will need PT/OT eval post-op              Anticipated d/c date is: 2 days              Patient currently is not medically stable to d/c.   Difficult to place patient No  Consultants:   Orthopedic Surgery  Procedures:   R hip arthoplasty 2/13  Antimicrobials: Anti-infectives (From admission, onward)   Start     Dose/Rate Route Frequency Ordered Stop   11/11/20 1815  ceFAZolin (ANCEF) IVPB 1 g/50 mL premix        1 g 100 mL/hr over 30 Minutes Intravenous Every 6 hours 11/11/20 1729 11/12/20 0729   11/11/20 0000  ceFAZolin (ANCEF) IVPB 2g/100 mL premix  Status:  Discontinued  2 g 200 mL/hr over 30 Minutes Intravenous Every 8 hours 11/10/20 1912 11/11/20 1729      Subjective: No complaints this AM  Objective: Vitals:   11/12/20 0432 11/12/20 0807 11/12/20 1156 11/12/20 1532  BP: 104/64 (!) 103/56 (!) 103/51 (!) 86/48  Pulse: 99 90 96 87  Resp: 17 16 16 15   Temp: 98.2 F (36.8 C) 98.1 F (36.7 C) 98.2 F (36.8 C) 97.7 F (36.5 C)  TempSrc:      SpO2: (!) 72% 94% 100% 96%  Weight:      Height:        Intake/Output Summary (Last 24 hours) at 11/12/2020 1808 Last data filed at 11/12/2020 0435 Gross per 24 hour  Intake --  Output 250 ml  Net -250 ml   Filed Weights   11/10/20 1743  11/10/20 2113  Weight: 65.8 kg 56.2 kg    Examination: General exam: Awake, laying in bed, in nad Respiratory system: Normal respiratory effort, no wheezing Cardiovascular system: regular rate, s1, s2 Gastrointestinal system: Soft, nondistended, positive BS Central nervous system: CN2-12 grossly intact, strength intact Extremities: Perfused, no clubbing Skin: Normal skin turgor, no notable skin lesions seen, palor Psychiatry: Mood normal // no visual hallucinations   Data Reviewed: I have personally reviewed following labs and imaging studies  CBC: Recent Labs  Lab 11/10/20 1753 11/11/20 0501 11/12/20 0629  WBC 9.9 10.2 14.2*  NEUTROABS 7.0  --   --   HGB 11.9* 11.6* 8.0*  HCT 35.7* 34.6* 23.8*  MCV 90.4 90.6 92.6  PLT 238 202 762   Basic Metabolic Panel: Recent Labs  Lab 11/10/20 1753 11/11/20 0501 11/12/20 0629  NA 137 136 134*  K 3.9 3.4* 3.7  CL 99 100 105  CO2 26 27 22   GLUCOSE 201* 154* 174*  BUN 15 13 21   CREATININE 0.84 0.74 1.24*  CALCIUM 9.4 8.8* 7.7*   GFR: Estimated Creatinine Clearance: 30 mL/min (A) (by C-G formula based on SCr of 1.24 mg/dL (H)). Liver Function Tests: No results for input(s): AST, ALT, ALKPHOS, BILITOT, PROT, ALBUMIN in the last 168 hours. No results for input(s): LIPASE, AMYLASE in the last 168 hours. No results for input(s): AMMONIA in the last 168 hours. Coagulation Profile: Recent Labs  Lab 11/10/20 1753  INR 1.2   Cardiac Enzymes: No results for input(s): CKTOTAL, CKMB, CKMBINDEX, TROPONINI in the last 168 hours. BNP (last 3 results) No results for input(s): PROBNP in the last 8760 hours. HbA1C: No results for input(s): HGBA1C in the last 72 hours. CBG: Recent Labs  Lab 11/11/20 1141 11/11/20 1254 11/11/20 1610 11/11/20 1732 11/11/20 2103  GLUCAP 142* 145* 184* 201* 175*   Lipid Profile: No results for input(s): CHOL, HDL, LDLCALC, TRIG, CHOLHDL, LDLDIRECT in the last 72 hours. Thyroid Function Tests: No  results for input(s): TSH, T4TOTAL, FREET4, T3FREE, THYROIDAB in the last 72 hours. Anemia Panel: No results for input(s): VITAMINB12, FOLATE, FERRITIN, TIBC, IRON, RETICCTPCT in the last 72 hours. Sepsis Labs: No results for input(s): PROCALCITON, LATICACIDVEN in the last 168 hours.  Recent Results (from the past 240 hour(s))  Resp Panel by RT-PCR (Flu A&B, Covid) Nasopharyngeal Swab     Status: None   Collection Time: 11/10/20  6:20 PM   Specimen: Nasopharyngeal Swab; Nasopharyngeal(NP) swabs in vial transport medium  Result Value Ref Range Status   SARS Coronavirus 2 by RT PCR NEGATIVE NEGATIVE Final    Comment: (NOTE) SARS-CoV-2 target nucleic acids are NOT DETECTED.  The SARS-CoV-2  RNA is generally detectable in upper respiratory specimens during the acute phase of infection. The lowest concentration of SARS-CoV-2 viral copies this assay can detect is 138 copies/mL. A negative result does not preclude SARS-Cov-2 infection and should not be used as the sole basis for treatment or other patient management decisions. A negative result may occur with  improper specimen collection/handling, submission of specimen other than nasopharyngeal swab, presence of viral mutation(s) within the areas targeted by this assay, and inadequate number of viral copies(<138 copies/mL). A negative result must be combined with clinical observations, patient history, and epidemiological information. The expected result is Negative.  Fact Sheet for Patients:  EntrepreneurPulse.com.au  Fact Sheet for Healthcare Providers:  IncredibleEmployment.be  This test is no t yet approved or cleared by the [place] FDA and  has been authorized for detection and/or diagnosis of SARS-CoV-2 by FDA under an Emergency Use Authorization (EUA). This EUA will remain  in effect (meaning this test can be used) for the duration of the COVID-19 declaration under Section 564(b)(1) of  the Act, 21 U.S.C.section 360bbb-3(b)(1), unless the authorization is terminated  or revoked sooner.       Influenza A by PCR NEGATIVE NEGATIVE Final   Influenza B by PCR NEGATIVE NEGATIVE Final    Comment: (NOTE) The Xpert Xpress SARS-CoV-2/FLU/RSV plus assay is intended as an aid in the diagnosis of influenza from Nasopharyngeal swab specimens and should not be used as a sole basis for treatment. Nasal washings and aspirates are unacceptable for Xpert Xpress SARS-CoV-2/FLU/RSV testing.  Fact Sheet for Patients: EntrepreneurPulse.com.au  Fact Sheet for Healthcare Providers: IncredibleEmployment.be  This test is not yet approved or cleared by the [place] FDA and has been authorized for detection and/or diagnosis of SARS-CoV-2 by FDA under an Emergency Use Authorization (EUA). This EUA will remain in effect (meaning this test can be used) for the duration of the COVID-19 declaration under Section 564(b)(1) of the Act, 21 U.S.C. section 360bbb-3(b)(1), unless the authorization is terminated or revoked.  Performed at [facility], [address] [place], [place] [address]      Radiology Studies: DG Pelvis Portable  Result Date: 11/11/2020 CLINICAL DATA:  Hip replacement EXAM: PORTABLE PELVIS 1-2 VIEWS COMPARISON:  X-ray earlier in the same day FINDINGS: The patient has undergone total hip arthroplasty on the right. There are expected postsurgical changes. The alignment is near anatomic. There are mild degenerative changes of the left hip. There is osteopenia. IMPRESSION: Expected postsurgical changes related to total hip arthroplasty on the right. Electronically Signed   By: Constance Holster M.D.   On: 11/11/2020 16:50   DG Pelvis Portable  Result Date: 11/11/2020 CLINICAL DATA:  Portable x-ray in the OR EXAM: PORTABLE PELVIS 1-2 VIEWS COMPARISON:  11/10/2020 FINDINGS: The femoral component of a total hip arthroplasty on the  right is noted. There are expected postsurgical changes. The acetabular component is not visualized. A sponge is noted. IMPRESSION: Intraoperative x-ray as detailed above. Electronically Signed   By: Constance Holster M.D.   On: 11/11/2020 15:14    Scheduled Meds: . acetaminophen  1,000 mg Oral Q8H  . aspirin EC  325 mg Oral Q breakfast  . atorvastatin  40 mg Oral q1800  . carvedilol  6.25 mg Oral BID WC  . clopidogrel  75 mg Oral Daily  . divalproex  250 mg Oral BID  . docusate sodium  100 mg Oral BID  . escitalopram  10 mg Oral Daily  . insulin glargine  14 Units Subcutaneous QHS  . traMADol  50 mg Oral Q6H  . vitamin B-12  1,000 mcg Oral Daily   Continuous Infusions: . sodium chloride 75 mL/hr at 11/12/20 1003     LOS: 2 days   Marylu Lund, MD [facility] Pager On Amion  If 7PM-7AM, please contact night-coverage 11/12/2020, 6:08 PM

## 2020-11-12 NOTE — Evaluation (Signed)
Physical Therapy Evaluation Patient Details Name: Shawna Lynch MRN: [id number] DOB: March 12, 1935 Today's Date: 11/12/2020   History of Present Illness  Pt admitted for R hip fracture after tripping and falling over dog. Pt now s/p R hip hemiarthroplasty on 11/11/20. History includes dementia, anxiety, CAD s/p PCI, and depression. Pt is poor historian, daughter at bedside.  Clinical Impression  Pt is a pleasantly confused 86 year old female who was admitted for R hip fracture and is s/p R hip hemiarthroplasty on 11/11/20. Pt performs bed mobility with total assist and is unable to further perform transfers/ambulation this date. Pt is only oriented to self and is very tearful/scared affect. Daughter in room provides history as pt is poor historian. Pt demonstrates deficits with pain/mobility/cognition. Due to cognition, concern for active and skillful participation in SNF setting. HHPT with familiar environment may be more appropriate as she has good family support and 24/7 assist by caregivers. Would benefit from skilled PT to address above deficits and promote optimal return to PLOF. Recommend transition to [place] upon discharge from acute hospitalization.     Follow Up Recommendations Home health PT;Supervision/Assistance - 24 hour    Equipment Recommendations  Hospital bed;Wheelchair (measurements PT)    Recommendations for Other Services       Precautions / Restrictions Precautions Precautions: Fall;Posterior Hip Precaution Booklet Issued: No Restrictions Weight Bearing Restrictions: Yes RLE Weight Bearing: Weight bearing as tolerated      Mobility  Bed Mobility Overal bed mobility: Needs Assistance Bed Mobility: Supine to Sit     Supine to sit: Total assist     General bed mobility comments: unable to follow commands, however doesn't resist transition to EOB. Once seated at EOB, heavy post leaning noted, unable to self correct. Pt very tearful and looks frightened. Sat at EOB for  a min prior to transition back supine. Further mobility not attempted.    Transfers                 General transfer comment: not attempted this session  Ambulation/Gait                Stairs            Wheelchair Mobility    Modified Rankin (Stroke Patients Only)       Balance Overall balance assessment: Needs assistance;History of Falls Sitting-balance support: Feet supported;Bilateral upper extremity supported Sitting balance-Leahy Scale: Zero Sitting balance - Comments: post leaning noted                                     Pertinent Vitals/Pain Pain Assessment: Faces Faces Pain Scale: Hurts little more Pain Location: R LE with movement Pain Descriptors / Indicators: Discomfort;Grimacing;Guarding Pain Intervention(s): Limited activity within patient's tolerance;Repositioned (RN notified for possible medication)    Home Living Family/patient expects to be discharged to:: Private residence Living Arrangements: Alone Available Help at Discharge: Personal care attendant;Available 24 hours/day (daughter is also available for support) Type of Home: House Home Access: Level entry     Home Layout: One level Home Equipment: Walker - 4 wheels;Grab bars - tub/shower;Shower seat      Prior Function Level of Independence: Needs assistance         Comments: per daughter, they physically assist her with ambulation. Primarily household ambulator. Unable to demonstrate carryover for use of AD.     Hand Dominance  Extremity/Trunk Assessment   Upper Extremity Assessment Upper Extremity Assessment: Difficult to assess due to impaired cognition (unable to follow commands. Does perform 3/5 grip strength and bi/tricep 3/5 on L UE.)    Lower Extremity Assessment Lower Extremity Assessment: Difficult to assess due to impaired cognition (doesn't follow commands)       Communication   Communication: No difficulties  Cognition  Arousal/Alertness: Awake/alert Behavior During Therapy:  (tearful) Overall Cognitive Status: Impaired/Different from baseline                                 General Comments: pt unable to answer orientation questions and doesn't recognize family in room. Per daughter, she usually recognizes family, however is confused at baseline.      General Comments      Exercises Other Exercises Other Exercises: supine ther-ex performed on B LE including SLR, hip abd/add, and heel slides. All ther-ex performed ~ 5 reps without active participation. Max assist required Other Exercises: Educated family in room on post hip precautions and positioning in bed.   Assessment/Plan    PT Assessment Patient needs continued PT services  PT Problem List Decreased strength;Decreased activity tolerance;Decreased balance;Decreased mobility;Decreased knowledge of use of DME;Decreased knowledge of precautions;Pain;Decreased cognition       PT Treatment Interventions DME instruction;Gait training;Therapeutic exercise;Balance training    PT Goals (Current goals can be found in the Care Plan section)  Acute Rehab PT Goals Patient Stated Goal: unable to state PT Goal Formulation: Patient unable to participate in goal setting Time For Goal Achievement: 11/26/20 Potential to Achieve Goals: Fair    Frequency 7X/week   Barriers to discharge        Co-evaluation               AM-PAC PT "6 Clicks" Mobility  Outcome Measure Help needed turning from your back to your side while in a flat bed without using bedrails?: Total Help needed moving from lying on your back to sitting on the side of a flat bed without using bedrails?: Total Help needed moving to and from a bed to a chair (including a wheelchair)?: Total Help needed standing up from a chair using your arms (e.g., wheelchair or bedside chair)?: Total Help needed to walk in hospital room?: Total Help needed climbing 3-5 steps with a  railing? : Total 6 Click Score: 6    End of Session   Activity Tolerance:  (limited due to dementia) Patient left: in bed;with bed alarm set Nurse Communication: Mobility status PT Visit Diagnosis: Muscle weakness (generalized) (M62.81);History of falling (Z91.81);Difficulty in walking, not elsewhere classified (R26.2);Pain Pain - Right/Left: Right Pain - part of body: Hip    Time: 3329-5188 PT Time Calculation (min) (ACUTE ONLY): 26 min   Charges:   PT Evaluation $PT Eval Low Complexity: 1 Low PT Treatments $Therapeutic Exercise: 8-22 mins        Greggory Stallion, PT, DPT [phone]   Lashya Passe 11/12/2020, 10:50 AM

## 2020-11-12 NOTE — Evaluation (Signed)
Occupational Therapy Evaluation Patient Details Name: Shawna Lynch MRN: [id number] DOB: 03/31/1935 Today's Date: 11/12/2020    History of Present Illness Pt admitted for R hip fracture after tripping and falling over dog. Pt now s/p R hip hemiarthroplasty on 11/11/20. History includes dementia, anxiety, CAD s/p PCI, and depression. Pt is poor historian, daughter at bedside.   Clinical Impression   Patient presenting with decreased I in self care, balance, functional mobility/transfers, endurance, and safety awareness. Pt's daughter present to confirm baseline. Patient has 24/7 caregivers at home who assist pt with self care tasks PTA. Pt ambulates without use of AD in home environment. She is cognitively impaired at baseline with hx of dementia. Pt unable to follow posterior hip precautions and tends to do better in familiar environments. Patient currently functioning at max -total A. Pt is unable to stand upright this session and requires max - total A for bed mobility. Pt would likely do better in familiar environment by returning home with [place]. Caregiver did express some concern about caregivers providing this level of care. Patient will benefit from acute OT to increase overall independence in the areas of ADLs, functional mobility, and safety awareness in order to safely discharge home with caregivers.    Follow Up Recommendations  Home health OT;Supervision/Assistance - 24 hour    Equipment Recommendations  3 in 1 bedside commode       Precautions / Restrictions Precautions Precautions: Fall;Posterior Hip Precaution Booklet Issued: No Restrictions Weight Bearing Restrictions: Yes RLE Weight Bearing: Weight bearing as tolerated      Mobility Bed Mobility Overal bed mobility: Needs Assistance Bed Mobility: Supine to Sit;Sit to Supine     Supine to sit: Max assist Sit to supine: Total assist   General bed mobility comments: Able to follow commands intermittently with mod  multimodal cuing.    Transfers Overall transfer level: Needs assistance Equipment used: None Transfers: Sit to/from Stand Sit to Stand: Max assist         General transfer comment: unable to fully stand upright    Balance Overall balance assessment: Needs assistance;History of Falls Sitting-balance support: Feet supported;Bilateral upper extremity supported Sitting balance-Leahy Scale: Poor Sitting balance - Comments: post leaning noted                                   ADL either performed or assessed with clinical judgement   ADL Overall ADL's : Needs assistance/impaired     Grooming: Wash/dry hands;Wash/dry face;Supervision/safety;Sitting                                 General ADL Comments: Pt unable to fully stand from EOB. OT anticipates pt needing max - total A for LB self care.     Vision Patient Visual Report: No change from baseline              Pertinent Vitals/Pain Pain Assessment: Faces Faces Pain Scale: No hurt Pain Location: R LE with movement Pain Descriptors / Indicators: Discomfort;Grimacing;Guarding Pain Intervention(s): Limited activity within patient's tolerance;Repositioned (RN notified for possible medication)     Hand Dominance Right   Extremity/Trunk Assessment Upper Extremity Assessment Upper Extremity Assessment: Generalized weakness   Lower Extremity Assessment Lower Extremity Assessment: Defer to PT evaluation       Communication Communication Communication: No difficulties   Cognition Arousal/Alertness: Awake/alert Behavior During Therapy:  (  tearful) Overall Cognitive Status: History of cognitive impairments - at baseline                                 General Comments: pt unable to answer orientation questions and doesn't recognize family in room. Per daughter, she usually recognizes family, however is confused at baseline.  Pt does follow commands with increased time and mod  multimodal cuing.   General Comments       Exercises Exercises: Other exercises Other Exercises Other Exercises: supine ther-ex performed on B LE including SLR, hip abd/add, and heel slides. All ther-ex performed ~ 5 reps without active participation. Max assist required Other Exercises: Educated family in room on post hip precautions and positioning in bed.        Home Living Family/patient expects to be discharged to:: Private residence Living Arrangements: Alone Available Help at Discharge: Personal care attendant;Available 24 hours/day Type of Home: House Home Access: Level entry     Home Layout: One level     Bathroom Shower/Tub: Occupational psychologist: Standard     Home Equipment: Walker - 4 wheels;Grab bars - tub/shower;Shower seat          Prior Functioning/Environment Level of Independence: Needs assistance        Comments: per daughter, they physically assist her with ambulation. Primarily household ambulator. Unable to demonstrate carryover for use of AD. Pt has caregiver's who assist with "at least 50% of bathing and dressing" .        OT Problem List: Decreased strength;Decreased activity tolerance;Decreased coordination;Decreased knowledge of use of DME or AE;Decreased safety awareness;Impaired balance (sitting and/or standing);Decreased knowledge of precautions;Pain;Decreased cognition      OT Treatment/Interventions: Self-care/ADL training;Energy conservation;DME and/or AE instruction;Therapeutic exercise;Manual therapy;Balance training;Therapeutic activities;Cognitive remediation/compensation;Patient/family education;Modalities    OT Goals(Current goals can be found in the care plan section) Acute Rehab OT Goals Patient Stated Goal: unable to state OT Goal Formulation: Patient unable to participate in goal setting Time For Goal Achievement: 11/26/20 Potential to Achieve Goals: Fair ADL Goals Pt Will Perform Grooming: (P) with min  assist;standing Pt Will Perform Lower Body Dressing: (P) with mod assist;sit to/from stand Pt Will Transfer to Toilet: (P) with mod assist;ambulating;stand pivot transfer;bedside commode Pt Will Perform Toileting - Clothing Manipulation and hygiene: (P) with mod assist;sit to/from stand  OT Frequency: Min 2X/week   Barriers to D/C:    none known at this time          AM-PAC OT "6 Clicks" Daily Activity     Outcome Measure Help from another person eating meals?: A Little Help from another person taking care of personal grooming?: A Lot Help from another person toileting, which includes using toliet, bedpan, or urinal?: A Lot Help from another person bathing (including washing, rinsing, drying)?: A Lot Help from another person to put on and taking off regular upper body clothing?: A Little Help from another person to put on and taking off regular lower body clothing?: A Lot 6 Click Score: 14   End of Session Nurse Communication: Mobility status  Activity Tolerance: Patient tolerated treatment well Patient left: in bed;with call bell/phone within reach;with bed alarm set  OT Visit Diagnosis: Unsteadiness on feet (R26.81);Repeated falls (R29.6);Muscle weakness (generalized) (M62.81)                Time: 3810-1751 OT Time Calculation (min): 23 min Charges:  OT General Charges $OT  Visit: 1 Visit OT Evaluation $OT Eval Low Complexity: 1 Low OT Treatments $Therapeutic Activity: 8-22 mins  Darleen Crocker, MS, OTR/L , CBIS ascom [phone]  11/12/20, 1:55 PM

## 2020-11-12 NOTE — Plan of Care (Signed)
  Problem: Pain Managment: Goal: General experience of comfort will improve Outcome: Progressing   Problem: Safety: Goal: Ability to remain free from injury will improve Outcome: Progressing   Problem: Health Behavior/Discharge Planning: Goal: Ability to manage health-related needs will improve Outcome: Progressing

## 2020-11-12 NOTE — Progress Notes (Signed)
  Subjective: 1 Day Post-Op Procedure(s) (LRB): ARTHROPLASTY BIPOLAR HIP (HEMIARTHROPLASTY) (Right) Patient reports pain as mild.   Patient is well, and has had no acute complaints or problems Plan is to go Skilled nursing facility after hospital stay. Negative for chest pain and shortness of breath Fever: no Gastrointestinal: Negative for nausea and vomiting  Objective: Vital signs in last 24 hours: Temp:  [97.4 F (36.3 C)-99.6 F (37.6 C)] 98.2 F (36.8 C) (02/14 0432) Pulse Rate:  [90-107] 99 (02/14 0432) Resp:  [10-20] 17 (02/14 0432) BP: (99-157)/(64-101) 104/64 (02/14 0432) SpO2:  [61 %-100 %] 72 % (02/14 0432)  Intake/Output from previous day:  Intake/Output Summary (Last 24 hours) at 11/12/2020 0715 Last data filed at 11/12/2020 0435 Gross per 24 hour  Intake 1100 ml  Output 900 ml  Net 200 ml    Intake/Output this shift: No intake/output data recorded.  Labs: Recent Labs    11/10/20 1753 11/11/20 0501 11/12/20 0629  HGB 11.9* 11.6* 8.0*   Recent Labs    11/11/20 0501 11/12/20 0629  WBC 10.2 14.2*  RBC 3.82* 2.57*  HCT 34.6* 23.8*  PLT 202 172   Recent Labs    11/11/20 0501 11/12/20 0629  NA 136 134*  K 3.4* 3.7  CL 100 105  CO2 27 22  BUN 13 21  CREATININE 0.74 1.24*  GLUCOSE 154* 174*  CALCIUM 8.8* 7.7*   Recent Labs    11/10/20 1753  INR 1.2     EXAM General - Patient is Confused Extremity - Neurovascular intact Sensation intact distally Dorsiflexion/Plantar flexion intact Compartment soft Dressing/Incision - clean, dry, blood tinged drainage Motor Function - intact, moving foot and toes well on exam.   Past Medical History:  Diagnosis Date  . Anxiety   . Arthritis    oa  . Coronary artery disease    04/06/17 PCI with DES to [facility], 90% in diag from LAD, nonobstructive disease in LAD, RCA. Normal EF.   [name] Dementia ([place])   . Depression   . Diabetes mellitus without complication ([place])   . Hypertension   . NSTEMI (non-ST  elevated myocardial infarction) ([place])   . Ringing in ears   . TIA (transient ischemic attack)     Assessment/Plan: 1 Day Post-Op Procedure(s) (LRB): ARTHROPLASTY BIPOLAR HIP (HEMIARTHROPLASTY) (Right) Active Problems:   Closed right hip fracture (HCC)  Estimated body mass index is 20 kg/m as calculated from the following:   Height as of this encounter: 5\' 6"  (1.676 m).   Weight as of this encounter: 56.2 kg. Advance diet Up with therapy   Acute blood loss anemia.  Hemoglobin 8.0.  Recheck labs. Follow-up at [facility] clinic orthopedics in 2 weeks for staple removal and right hip x-rays.  DVT Prophylaxis - Foot Pumps, TED hose and Plavix Weight-Bearing as tolerated to right leg  Reche Dixon, PA-C Orthopaedic Surgery 11/12/2020, 7:15 AM

## 2020-11-13 LAB — CBC
HCT: 20.3 % — ABNORMAL LOW (ref 36.0–46.0)
Hemoglobin: 6.7 g/dL — ABNORMAL LOW (ref 12.0–15.0)
MCH: 31 pg (ref 26.0–34.0)
MCHC: 33 g/dL (ref 30.0–36.0)
MCV: 94 fL (ref 80.0–100.0)
Platelets: 125 10*3/uL — ABNORMAL LOW (ref 150–400)
RBC: 2.16 MIL/uL — ABNORMAL LOW (ref 3.87–5.11)
RDW: 14.1 % (ref 11.5–15.5)
WBC: 8.7 10*3/uL (ref 4.0–10.5)
nRBC: 0 % (ref 0.0–0.2)

## 2020-11-13 LAB — BASIC METABOLIC PANEL
Anion gap: 5 (ref 5–15)
BUN: 25 mg/dL — ABNORMAL HIGH (ref 8–23)
CO2: 24 mmol/L (ref 22–32)
Calcium: 8 mg/dL — ABNORMAL LOW (ref 8.9–10.3)
Chloride: 110 mmol/L (ref 98–111)
Creatinine, Ser: 0.94 mg/dL (ref 0.44–1.00)
GFR, Estimated: 60 mL/min — ABNORMAL LOW (ref >60)
Glucose, Bld: 161 mg/dL — ABNORMAL HIGH (ref 70–99)
Potassium: 3.7 mmol/L (ref 3.5–5.1)
Sodium: 139 mmol/L (ref 135–145)

## 2020-11-13 LAB — ABO/RH: ABO/RH(D): A NEG

## 2020-11-13 LAB — PREPARE RBC (CROSSMATCH)

## 2020-11-13 LAB — GLUCOSE, CAPILLARY: Glucose-Capillary: 136 mg/dL — ABNORMAL HIGH (ref 70–99)

## 2020-11-13 MED ORDER — SODIUM CHLORIDE 0.9% IV SOLUTION: Freq: Once | INTRAVENOUS | Status: AC

## 2020-11-13 NOTE — Care Management Important Message (Signed)
Important Message  Patient Details  Name: Shawna Lynch MRN: [id number] Date of Birth: 08/20/1935   Medicare Important Message Given:  Yes     Dannette Barbara 11/13/2020, 12:04 PM

## 2020-11-13 NOTE — Progress Notes (Signed)
  Subjective: 2 Days Post-Op Procedure(s) (LRB): ARTHROPLASTY BIPOLAR HIP (HEMIARTHROPLASTY) (Right) Patient reports pain as mild.   Patient is well, and has had no acute complaints or problems Plan is to go Home after hospital stay.  Patient was at home prior to her fall with 24-hour assistance.  Daughter feels as if they have resources to continue care at home. Negative for chest pain and shortness of breath Fever: no Gastrointestinal: Negative for nausea and vomiting  Objective: Vital signs in last 24 hours: Temp:  [97.5 F (36.4 C)-98.6 F (37 C)] 97.5 F (36.4 C) (02/15 0742) Pulse Rate:  [79-96] 88 (02/15 0742) Resp:  [15-19] 16 (02/15 0742) BP: (86-130)/(48-67) 129/67 (02/15 0742) SpO2:  [96 %-100 %] 99 % (02/15 0742)  Intake/Output from previous day: No intake or output data in the 24 hours ending 11/13/20 1014  Intake/Output this shift: No intake/output data recorded.  Labs: Recent Labs    11/10/20 1753 11/11/20 0501 11/12/20 0629 11/13/20 0527  HGB 11.9* 11.6* 8.0* 6.7*   Recent Labs    11/12/20 0629 11/13/20 0527  WBC 14.2* 8.7  RBC 2.57* 2.16*  HCT 23.8* 20.3*  PLT 172 125*   Recent Labs    11/12/20 0629 11/13/20 0527  NA 134* 139  K 3.7 3.7  CL 105 110  CO2 22 24  BUN 21 25*  CREATININE 1.24* 0.94  GLUCOSE 174* 161*  CALCIUM 7.7* 8.0*   Recent Labs    11/10/20 1753  INR 1.2     EXAM General - Patient is Confused Extremity - Neurovascular intact Sensation intact distally Dorsiflexion/Plantar flexion intact Compartment soft  Dressing/Incision - clean, dry, no drainage Motor Function - intact, moving foot and toes well on exam.   Past Medical History:  Diagnosis Date  . Anxiety   . Arthritis    oa  . Coronary artery disease    04/06/17 PCI with DES to [facility], 90% in diag from LAD, nonobstructive disease in LAD, RCA. Normal EF.   [name] Dementia ([place])   . Depression   . Diabetes mellitus without complication ([place])   . Hypertension    . NSTEMI (non-ST elevated myocardial infarction) ([place])   . Ringing in ears   . TIA (transient ischemic attack)     Assessment/Plan: 2 Days Post-Op Procedure(s) (LRB): ARTHROPLASTY BIPOLAR HIP (HEMIARTHROPLASTY) (Right) Active Problems:   Closed right hip fracture (HCC)  Estimated body mass index is 20 kg/m as calculated from the following:   Height as of this encounter: 5\' 6"  (1.676 m).   Weight as of this encounter: 56.2 kg. Advance diet Up with therapy  Pain well controlled Vital signs are stable Acute blood loss anemia.  Hemoglobin 6.7.  Patient to receive packed red blood cells today.  Recheck hemoglobin posttransfusion and in the morning Care manager to assist with discharge to home with home health pending medical clearance  Follow-up at [facility] clinic orthopedics in 2 weeks for staple removal and right hip x-rays.  DVT Prophylaxis - Foot Pumps, TED hose and Plavix Weight-Bearing as tolerated to right leg  Duanne Guess, PA-C Orthopaedic Surgery 11/13/2020, 10:14 AM

## 2020-11-13 NOTE — Progress Notes (Signed)
PROGRESS NOTE    Shawna Lynch  QQP:[id number] DOB: 11-May-1935 DOA: 11/10/2020 PCP: Philmore Pali, NP    Brief Narrative:  86 y.o. Caucasian female with medical history significant for osteoarthritis, anxiety, coronary artery disease status post PCI and stent, dementia, depression, type II hypertension and TIA, who presented to the emergency room with acute onset of right hip pain after having a mechanical fall tripping on her dog and falling on her right hip.  Her family noted her right leg was shortened.  Her pain has been severe and getting worse with any movement.  It became better after 200 mcg of IV fentanyl given by EMS.  No reported nausea or vomiting or abdominal pain.  No reported chest pain or palpitations.  No loss of consciousness or presyncope.  No paresthesias or focal muscle weakness.  In the ED, pt was found to have R mid-femoral neck fracture. Orthopedic Surgery consulted  Assessment & Plan:   Active Problems:   Closed right hip fracture (HCC)  1.  Closed right hip fracture secondary to mechanical fall. -Aspirin and Plavix held at time of presentation -She has not been taking her Brilinta for about a month. -Orthopedic Surgery consulted at time of presentation. -Pt is now s/p R hip arthoplasty on 2/13 -Cont with PT as tolerated  2.  Hypertensive urgency. -Presenting sbp in the 200's, likely secondary to her pain. -Home antihypertensives continued. BP presently stable and controlled  3.  Coronary artery disease status post PCI and stent.  We will continue her as needed sublingual  -continue on nitroglycerin, beta-blocker therapy with Coreg, statin therapy  - aspirin and Plavix were held at time of presentation, would resume when OK with Ortho  4.  Type 2 diabetes mellitus without complications. -We will hold off her Metformin while in hospital -continue on SSI coverage as needed  5.  Depression. -Continued on home Lexapro and Depakote.  6.   Dyslipidemia. -We will continue statin therapy.  7.  Vitamin B12 deficiency. -We will continue her vitamin B12.  8. Acute blood loss anemia -post-op hgb of 8, down to 6.7 this AM -Suspect blood loss secondary to surgery -2 units PRBC's ordered -Repeat cbc in AM  9. ARF -Likely secondary to acute above anemia -Hold nephrotoxic agents -renal function normalized with IVF -Repeat bmet in AM   DVT prophylaxis: SCD's Code Status: DNR Family Communication: Pt in room  Status is: Inpatient  Remains inpatient appropriate because:Unsafe d/c plan and Inpatient level of care appropriate due to severity of illness   Dispo: The patient is from: Home              Anticipated d/c is to: Home with HHPTOT              Anticipated d/c date is: 1 day              Patient currently is not medically stable to d/c.   Difficult to place patient No  Consultants:   Orthopedic Surgery  Procedures:   R hip arthoplasty 2/13  Antimicrobials: Anti-infectives (From admission, onward)   Start     Dose/Rate Route Frequency Ordered Stop   11/11/20 1815  ceFAZolin (ANCEF) IVPB 1 g/50 mL premix        1 g 100 mL/hr over 30 Minutes Intravenous Every 6 hours 11/11/20 1729 11/12/20 0729   11/11/20 0000  ceFAZolin (ANCEF) IVPB 2g/100 mL premix  Status:  Discontinued        2 g  200 mL/hr over 30 Minutes Intravenous Every 8 hours 11/10/20 1912 11/11/20 1729      Subjective: Without complaints this AM  Objective: Vitals:   11/13/20 1230 11/13/20 1258 11/13/20 1500 11/13/20 1600  BP: (!) 109/54 (!) 110/56 (!) 141/69 (!) 141/69  Pulse: 63 85 87 87  Resp: 17 17 16 16   Temp: 97.8 F (36.6 C) 97.9 F (36.6 C) 97.9 F (36.6 C) 97.6 F (36.4 C)  TempSrc: Oral Oral Oral Oral  SpO2: 96% 92% 91% 91%  Weight:      Height:        Intake/Output Summary (Last 24 hours) at 11/13/2020 1613 Last data filed at 11/13/2020 3546 Gross per 24 hour  Intake 640 ml  Output --  Net 640 ml   Filed Weights    11/10/20 1743 11/10/20 2113  Weight: 65.8 kg 56.2 kg    Examination: General exam: Conversant, in no acute distress Respiratory system: normal chest rise, clear, no audible wheezing Cardiovascular system: regular rhythm, s1-s2 Gastrointestinal system: Nondistended, nontender, pos BS Central nervous system: No seizures, no tremors Extremities: No cyanosis, no joint deformities Skin: No rashes, no pallor Psychiatry: Affect normal // no auditory hallucinations   Data Reviewed: I have personally reviewed following labs and imaging studies  CBC: Recent Labs  Lab 11/10/20 1753 11/11/20 0501 11/12/20 0629 11/13/20 0527  WBC 9.9 10.2 14.2* 8.7  NEUTROABS 7.0  --   --   --   HGB 11.9* 11.6* 8.0* 6.7*  HCT 35.7* 34.6* 23.8* 20.3*  MCV 90.4 90.6 92.6 94.0  PLT 238 202 172 568*   Basic Metabolic Panel: Recent Labs  Lab 11/10/20 1753 11/11/20 0501 11/12/20 0629 11/13/20 0527  NA 137 136 134* 139  K 3.9 3.4* 3.7 3.7  CL 99 100 105 110  CO2 26 27 22 24   GLUCOSE 201* 154* 174* 161*  BUN 15 13 21  25*  CREATININE 0.84 0.74 1.24* 0.94  CALCIUM 9.4 8.8* 7.7* 8.0*   GFR: Estimated Creatinine Clearance: 39.5 mL/min (by C-G formula based on SCr of 0.94 mg/dL). Liver Function Tests: No results for input(s): AST, ALT, ALKPHOS, BILITOT, PROT, ALBUMIN in the last 168 hours. No results for input(s): LIPASE, AMYLASE in the last 168 hours. No results for input(s): AMMONIA in the last 168 hours. Coagulation Profile: Recent Labs  Lab 11/10/20 1753  INR 1.2   Cardiac Enzymes: No results for input(s): CKTOTAL, CKMB, CKMBINDEX, TROPONINI in the last 168 hours. BNP (last 3 results) No results for input(s): PROBNP in the last 8760 hours. HbA1C: No results for input(s): HGBA1C in the last 72 hours. CBG: Recent Labs  Lab 11/11/20 1254 11/11/20 1610 11/11/20 1732 11/11/20 2103 11/12/20 2201  GLUCAP 145* 184* 201* 175* 169*   Lipid Profile: No results for input(s): CHOL, HDL,  LDLCALC, TRIG, CHOLHDL, LDLDIRECT in the last 72 hours. Thyroid Function Tests: No results for input(s): TSH, T4TOTAL, FREET4, T3FREE, THYROIDAB in the last 72 hours. Anemia Panel: No results for input(s): VITAMINB12, FOLATE, FERRITIN, TIBC, IRON, RETICCTPCT in the last 72 hours. Sepsis Labs: No results for input(s): PROCALCITON, LATICACIDVEN in the last 168 hours.  Recent Results (from the past 240 hour(s))  Resp Panel by RT-PCR (Flu A&B, Covid) Nasopharyngeal Swab     Status: None   Collection Time: 11/10/20  6:20 PM   Specimen: Nasopharyngeal Swab; Nasopharyngeal(NP) swabs in vial transport medium  Result Value Ref Range Status   SARS Coronavirus 2 by RT PCR NEGATIVE NEGATIVE Final  Comment: (NOTE) SARS-CoV-2 target nucleic acids are NOT DETECTED.  The SARS-CoV-2 RNA is generally detectable in upper respiratory specimens during the acute phase of infection. The lowest concentration of SARS-CoV-2 viral copies this assay can detect is 138 copies/mL. A negative result does not preclude SARS-Cov-2 infection and should not be used as the sole basis for treatment or other patient management decisions. A negative result may occur with  improper specimen collection/handling, submission of specimen other than nasopharyngeal swab, presence of viral mutation(s) within the areas targeted by this assay, and inadequate number of viral copies(<138 copies/mL). A negative result must be combined with clinical observations, patient history, and epidemiological information. The expected result is Negative.  Fact Sheet for Patients:  EntrepreneurPulse.com.au  Fact Sheet for Healthcare Providers:  IncredibleEmployment.be  This test is no t yet approved or cleared by the [place] FDA and  has been authorized for detection and/or diagnosis of SARS-CoV-2 by FDA under an Emergency Use Authorization (EUA). This EUA will remain  in effect (meaning this test  can be used) for the duration of the COVID-19 declaration under Section 564(b)(1) of the Act, 21 U.S.C.section 360bbb-3(b)(1), unless the authorization is terminated  or revoked sooner.       Influenza A by PCR NEGATIVE NEGATIVE Final   Influenza B by PCR NEGATIVE NEGATIVE Final    Comment: (NOTE) The Xpert Xpress SARS-CoV-2/FLU/RSV plus assay is intended as an aid in the diagnosis of influenza from Nasopharyngeal swab specimens and should not be used as a sole basis for treatment. Nasal washings and aspirates are unacceptable for Xpert Xpress SARS-CoV-2/FLU/RSV testing.  Fact Sheet for Patients: EntrepreneurPulse.com.au  Fact Sheet for Healthcare Providers: IncredibleEmployment.be  This test is not yet approved or cleared by the [place] FDA and has been authorized for detection and/or diagnosis of SARS-CoV-2 by FDA under an Emergency Use Authorization (EUA). This EUA will remain in effect (meaning this test can be used) for the duration of the COVID-19 declaration under Section 564(b)(1) of the Act, 21 U.S.C. section 360bbb-3(b)(1), unless the authorization is terminated or revoked.  Performed at [facility], [address] [place], [place] [address]      Radiology Studies: DG Pelvis Portable  Result Date: 11/11/2020 CLINICAL DATA:  Hip replacement EXAM: PORTABLE PELVIS 1-2 VIEWS COMPARISON:  X-ray earlier in the same day FINDINGS: The patient has undergone total hip arthroplasty on the right. There are expected postsurgical changes. The alignment is near anatomic. There are mild degenerative changes of the left hip. There is osteopenia. IMPRESSION: Expected postsurgical changes related to total hip arthroplasty on the right. Electronically Signed   By: Constance Holster M.D.   On: 11/11/2020 16:50    Scheduled Meds: . aspirin EC  325 mg Oral Q breakfast  . atorvastatin  40 mg Oral q1800  . carvedilol  6.25 mg Oral BID  WC  . clopidogrel  75 mg Oral Daily  . divalproex  250 mg Oral BID  . docusate sodium  100 mg Oral BID  . escitalopram  10 mg Oral Daily  . insulin glargine  14 Units Subcutaneous QHS  . traMADol  50 mg Oral Q6H  . vitamin B-12  1,000 mcg Oral Daily   Continuous Infusions: . sodium chloride 75 mL/hr at 11/12/20 2325     LOS: 3 days   Marylu Lund, MD [facility] Pager On Amion  If 7PM-7AM, please contact night-coverage 11/13/2020, 4:13 PM

## 2020-11-13 NOTE — Progress Notes (Signed)
Physical Therapy Treatment Patient Details Name: Shawna Lynch MRN: [id number] DOB: [birth year] Today's Date: 11/13/2020    History of Present Illness Pt admitted for R hip fracture after tripping and falling over dog. Pt now s/p R hip hemiarthroplasty on 11/11/20. History includes dementia, anxiety, CAD s/p PCI, and depression. Pt is poor historian, daughter at bedside.    PT Comments    Pt with low Hgb (6.7) this date, pending transfusion. Attempted supine there-ex in bed with no active movement noted. Pt in hip abduction wedge, adjusted to ensure hip precautions. Mits on upon arrival, removed for breakfast. Increased time for repositioning given. WIll continue to progress as able.  Follow Up Recommendations  Home health PT;Supervision/Assistance - 24 hour (possible memory care LTC?)     Equipment Recommendations  Hospital bed;Wheelchair (measurements PT)    Recommendations for Other Services       Precautions / Restrictions Precautions Precautions: Fall;Posterior Hip Precaution Booklet Issued: No Restrictions Weight Bearing Restrictions: Yes RLE Weight Bearing: Weight bearing as tolerated    Mobility  Bed Mobility               General bed mobility comments: deferred due to pending transfusion    Transfers                    Ambulation/Gait                 Stairs             Wheelchair Mobility    Modified Rankin (Stroke Patients Only)       Balance                                            Cognition Arousal/Alertness: Awake/alert Behavior During Therapy: WFL for tasks assessed/performed Overall Cognitive Status: History of cognitive impairments - at baseline                                 General Comments: per nurse was more confused overnight, mits placed. During session, appears at baseline confusion      Exercises Other Exercises Other Exercises: supine ther-ex performed on R LE  including SLR, hip abd/add, and knee flexion. No active movement noted. ~ 5 reps with heavy encouragement to participate with therapist.    General Comments        Pertinent Vitals/Pain Pain Assessment: No/denies pain    Home Living                      Prior Function            PT Goals (current goals can now be found in the care plan section) Acute Rehab PT Goals Patient Stated Goal: unable to state PT Goal Formulation: Patient unable to participate in goal setting Time For Goal Achievement: 11/26/20 Potential to Achieve Goals: Fair Progress towards PT goals: Progressing toward goals    Frequency    7X/week      PT Plan Current plan remains appropriate    Co-evaluation              AM-PAC PT "6 Clicks" Mobility   Outcome Measure  Help needed turning from your back to your side while in a flat bed without using bedrails?: Total Help needed moving from  lying on your back to sitting on the side of a flat bed without using bedrails?: Total Help needed moving to and from a bed to a chair (including a wheelchair)?: Total Help needed standing up from a chair using your arms (e.g., wheelchair or bedside chair)?: Total Help needed to walk in hospital room?: Total Help needed climbing 3-5 steps with a railing? : Total 6 Click Score: 6    End of Session   Activity Tolerance: Patient tolerated treatment well Patient left: in bed;with bed alarm set;with family/visitor present Nurse Communication: Mobility status PT Visit Diagnosis: Muscle weakness (generalized) (M62.81);History of falling (Z91.81);Difficulty in walking, not elsewhere classified (R26.2);Pain Pain - Right/Left: Right Pain - part of body: Hip     Time: 0913-0926 PT Time Calculation (min) (ACUTE ONLY): 13 min  Charges:  $Therapeutic Activity: 8-22 mins                     Greggory Stallion, PT, DPT [phone]    Ray,Stephanie 11/13/2020, 12:31 PM

## 2020-11-13 NOTE — TOC Progression Note (Signed)
Transition of Care [facility]) - Progression Note    Patient Details  Name: Shawna Lynch MRN: [id number] Date of Birth: 02/21/1935  [facility]) CM/SW [place], RN Phone Number: 11/13/2020, 10:19 AM  Clinical Narrative:  RNCM received communication from Sylvester with [facility], she plans to come evaluate patient today around noon.      Expected Discharge Plan: Memory Care Barriers to Discharge: No Barriers Identified  Expected Discharge Plan and Services Expected Discharge Plan: Memory Care       Living arrangements for the past 2 months: Single Family Home                                       Social Determinants of Health (SDOH) Interventions    Readmission Risk Interventions No flowsheet data found.

## 2020-11-14 ENCOUNTER — Other Ambulatory Visit: Payer: Self-pay

## 2020-11-14 DIAGNOSIS — D62 Acute posthemorrhagic anemia: Secondary | ICD-10-CM

## 2020-11-14 DIAGNOSIS — N179 Acute kidney failure, unspecified: Secondary | ICD-10-CM

## 2020-11-14 DIAGNOSIS — E876 Hypokalemia: Secondary | ICD-10-CM

## 2020-11-14 LAB — CBC
HCT: 29.1 % — ABNORMAL LOW (ref 36.0–46.0)
Hemoglobin: 9.9 g/dL — ABNORMAL LOW (ref 12.0–15.0)
MCH: 30.8 pg (ref 26.0–34.0)
MCHC: 34 g/dL (ref 30.0–36.0)
MCV: 90.7 fL (ref 80.0–100.0)
Platelets: 166 10*3/uL (ref 150–400)
RBC: 3.21 MIL/uL — ABNORMAL LOW (ref 3.87–5.11)
RDW: 14.6 % (ref 11.5–15.5)
WBC: 8.6 10*3/uL (ref 4.0–10.5)
nRBC: 0 % (ref 0.0–0.2)

## 2020-11-14 LAB — TYPE AND SCREEN
ABO/RH(D): A NEG
Antibody Screen: NEGATIVE
Unit division: 0
Unit division: 0

## 2020-11-14 LAB — BPAM RBC
Blood Product Expiration Date: 202202252359
Blood Product Expiration Date: 202202252359
ISSUE DATE / TIME: 202202151235
ISSUE DATE / TIME: 202202151606
Unit Type and Rh: 600
Unit Type and Rh: 600

## 2020-11-14 LAB — BASIC METABOLIC PANEL
Anion gap: 7 (ref 5–15)
BUN: 20 mg/dL (ref 8–23)
CO2: 25 mmol/L (ref 22–32)
Calcium: 8.1 mg/dL — ABNORMAL LOW (ref 8.9–10.3)
Chloride: 109 mmol/L (ref 98–111)
Creatinine, Ser: 0.75 mg/dL (ref 0.44–1.00)
GFR, Estimated: >60 mL/min (ref >60)
Glucose, Bld: 129 mg/dL — ABNORMAL HIGH (ref 70–99)
Potassium: 3.4 mmol/L — ABNORMAL LOW (ref 3.5–5.1)
Sodium: 141 mmol/L (ref 135–145)

## 2020-11-14 MED ORDER — POTASSIUM CHLORIDE CRYS ER 20 MEQ PO TBCR
40.0000 meq | EXTENDED_RELEASE_TABLET | Freq: Once | ORAL | Status: AC
  Administered 2020-11-14: 40 meq via ORAL
  Filled 2020-11-14: qty 2

## 2020-11-14 NOTE — Patient Outreach (Signed)
[facility]) Care Management  11/14/2020  TEHYA LEATH 1935/09/04 [id number]   First telephone outreach attempt to obtain mRS. Call dropped called back, No answer.  Philmore Pali [facility] Management Assistant [phone]

## 2020-11-14 NOTE — Progress Notes (Signed)
PROGRESS NOTE    Shawna Lynch  GXQ:[id number] DOB: Jan 03, 1935 DOA: 11/10/2020 PCP: Philmore Pali, NP    Brief Narrative:  86 y.o. Caucasian female with medical history significant for osteoarthritis, anxiety, coronary artery disease status post PCI and stent, dementia, depression, type II hypertension and TIA, who presented to the emergency room with acute onset of right hip pain after having a mechanical fall tripping on her dog and falling on her right hip.  Her family noted her right leg was shortened.  Her pain has been severe and getting worse with any movement.  It became better after 200 mcg of IV fentanyl given by EMS.  No reported nausea or vomiting or abdominal pain.  No reported chest pain or palpitations.  No loss of consciousness or presyncope.  No paresthesias or focal muscle weakness.  In the ED, pt was found to have R mid-femoral neck fracture. Orthopedic Surgery consulted  Assessment & Plan:   Active Problems:   Closed right hip fracture (HCC)  1.  Closed right hip fracture secondary to mechanical fall. -Aspirin and Plavix held at time of presentation -She has not been taking her Brilinta for about a month. - s/p R hip arthoplasty on 2/13 -Cont with PT as tolerated  2.  Hypertensive urgency. -Presenting sbp in the 200's, likely secondary to her pain. -Home antihypertensives continued. BP presently stable and controlled  3.  Coronary artery disease status post PCI and stent.  We will continue her as needed sublingual  -continue on nitroglycerin, beta-blocker therapy with Coreg, statin therapy  - aspirin and Plavix were held at time of presentation, would resume when OK with Ortho  4.  Type 2 diabetes mellitus without complications. - hold off her Metformin while in hospital -continue on SSI coverage as needed  5.  Depression. -Continued on home Lexapro and Depakote.  6.  Dyslipidemia. - continue statin therapy.  7.  Vitamin B12 deficiency. - continue her  vitamin B12.  8. Acute blood loss anemia -post-op hgb of 8, down to 6.7 this AM -Suspect blood loss secondary to surgery -Status post 2 units of packed red blood cell transfusion on 2/15 with a hemoglobin of 9.9 this morning.  9. ARF -Likely secondary to acute above anemia -Hold nephrotoxic agents -renal function normalized with IVF  10.  Hypokalemia Replete and recheck   DVT prophylaxis: SCD's Code Status: DNR Family Communication: Updated patient's daughter at bedside.  She has discussed with her facility who will have room available for patient on 2/17  Status is: Inpatient  Remains inpatient appropriate because:Unsafe d/c plan and Inpatient level of care appropriate due to severity of illness   Dispo: The patient is from: Home              Anticipated d/c is to: [facility]              Anticipated d/c date is: 1 day              Patient currently is not medically stable to d/c.   Difficult to place patient No  Consultants:   Orthopedic Surgery  Procedures:   R hip arthoplasty 2/13  Antimicrobials: Anti-infectives (From admission, onward)   Start     Dose/Rate Route Frequency Ordered Stop   11/11/20 1815  ceFAZolin (ANCEF) IVPB 1 g/50 mL premix        1 g 100 mL/hr over 30 Minutes Intravenous Every 6 hours 11/11/20 1729 11/12/20 0729  11/11/20 0000  ceFAZolin (ANCEF) IVPB 2g/100 mL premix  Status:  Discontinued        2 g 200 mL/hr over 30 Minutes Intravenous Every 8 hours 11/10/20 1912 11/11/20 1729      Subjective: Patient denies any new symptoms at this time.  She seemed to be crying intermittently.  Daughter at bedside and confirms this as her normal behavior.  Objective: Vitals:   11/14/20 0552 11/14/20 0812 11/14/20 1151 11/14/20 1536  BP: 136/66 (!) 151/72 138/68 (!) 144/68  Pulse: 80 86 76 85  Resp: 16 16 16 16   Temp: 98.4 F (36.9 C) 97.8 F (36.6 C) (!) 97.5 F (36.4 C) (!) 97.5 F (36.4 C)  TempSrc: Oral  Oral  Oral  SpO2: 94% 99% 97% 93%  Weight:      Height:        Intake/Output Summary (Last 24 hours) at 11/14/2020 1633 Last data filed at 11/14/2020 1100 Gross per 24 hour  Intake 396 ml  Output 900 ml  Net -504 ml   Filed Weights   11/10/20 1743 11/10/20 2113  Weight: 65.8 kg 56.2 kg    Examination: General exam: Conversant, in no acute distress Respiratory system: normal chest rise, clear, no audible wheezing Cardiovascular system: regular rhythm, s1-s2 Gastrointestinal system: Nondistended, nontender, pos BS Central nervous system: No seizures, no tremors Extremities: No cyanosis, no joint deformities Skin: No rashes, no pallor Psychiatry: Intermittent crying  Data Reviewed: I have personally reviewed following labs and imaging studies  CBC: Recent Labs  Lab 11/10/20 1753 11/11/20 0501 11/12/20 0629 11/13/20 0527 11/14/20 0541  WBC 9.9 10.2 14.2* 8.7 8.6  NEUTROABS 7.0  --   --   --   --   HGB 11.9* 11.6* 8.0* 6.7* 9.9*  HCT 35.7* 34.6* 23.8* 20.3* 29.1*  MCV 90.4 90.6 92.6 94.0 90.7  PLT 238 202 172 125* 914   Basic Metabolic Panel: Recent Labs  Lab 11/10/20 1753 11/11/20 0501 11/12/20 0629 11/13/20 0527 11/14/20 0541  NA 137 136 134* 139 141  K 3.9 3.4* 3.7 3.7 3.4*  CL 99 100 105 110 109  CO2 26 27 22 24 25   GLUCOSE 201* 154* 174* 161* 129*  BUN 15 13 21  25* 20  CREATININE 0.84 0.74 1.24* 0.94 0.75  CALCIUM 9.4 8.8* 7.7* 8.0* 8.1*   GFR: Estimated Creatinine Clearance: 46.4 mL/min (by C-G formula based on SCr of 0.75 mg/dL). Liver Function Tests: No results for input(s): AST, ALT, ALKPHOS, BILITOT, PROT, ALBUMIN in the last 168 hours. No results for input(s): LIPASE, AMYLASE in the last 168 hours. No results for input(s): AMMONIA in the last 168 hours. Coagulation Profile: Recent Labs  Lab 11/10/20 1753  INR 1.2   Cardiac Enzymes: No results for input(s): CKTOTAL, CKMB, CKMBINDEX, TROPONINI in the last 168 hours. BNP (last 3 results) No  results for input(s): PROBNP in the last 8760 hours. HbA1C: No results for input(s): HGBA1C in the last 72 hours. CBG: Recent Labs  Lab 11/11/20 1610 11/11/20 1732 11/11/20 2103 11/12/20 2201 11/13/20 2217  GLUCAP 184* 201* 175* 169* 136*   Lipid Profile: No results for input(s): CHOL, HDL, LDLCALC, TRIG, CHOLHDL, LDLDIRECT in the last 72 hours. Thyroid Function Tests: No results for input(s): TSH, T4TOTAL, FREET4, T3FREE, THYROIDAB in the last 72 hours. Anemia Panel: No results for input(s): VITAMINB12, FOLATE, FERRITIN, TIBC, IRON, RETICCTPCT in the last 72 hours. Sepsis Labs: No results for input(s): PROCALCITON, LATICACIDVEN in the last 168 hours.  Recent Results (  from the past 240 hour(s))  Resp Panel by RT-PCR (Flu A&B, Covid) Nasopharyngeal Swab     Status: None   Collection Time: 11/10/20  6:20 PM   Specimen: Nasopharyngeal Swab; Nasopharyngeal(NP) swabs in vial transport medium  Result Value Ref Range Status   SARS Coronavirus 2 by RT PCR NEGATIVE NEGATIVE Final    Comment: (NOTE) SARS-CoV-2 target nucleic acids are NOT DETECTED.  The SARS-CoV-2 RNA is generally detectable in upper respiratory specimens during the acute phase of infection. The lowest concentration of SARS-CoV-2 viral copies this assay can detect is 138 copies/mL. A negative result does not preclude SARS-Cov-2 infection and should not be used as the sole basis for treatment or other patient management decisions. A negative result may occur with  improper specimen collection/handling, submission of specimen other than nasopharyngeal swab, presence of viral mutation(s) within the areas targeted by this assay, and inadequate number of viral copies(<138 copies/mL). A negative result must be combined with clinical observations, patient history, and epidemiological information. The expected result is Negative.  Fact Sheet for Patients:  EntrepreneurPulse.com.au  Fact Sheet for  Healthcare Providers:  IncredibleEmployment.be  This test is no t yet approved or cleared by the [place] FDA and  has been authorized for detection and/or diagnosis of SARS-CoV-2 by FDA under an Emergency Use Authorization (EUA). This EUA will remain  in effect (meaning this test can be used) for the duration of the COVID-19 declaration under Section 564(b)(1) of the Act, 21 U.S.C.section 360bbb-3(b)(1), unless the authorization is terminated  or revoked sooner.       Influenza A by PCR NEGATIVE NEGATIVE Final   Influenza B by PCR NEGATIVE NEGATIVE Final    Comment: (NOTE) The Xpert Xpress SARS-CoV-2/FLU/RSV plus assay is intended as an aid in the diagnosis of influenza from Nasopharyngeal swab specimens and should not be used as a sole basis for treatment. Nasal washings and aspirates are unacceptable for Xpert Xpress SARS-CoV-2/FLU/RSV testing.  Fact Sheet for Patients: EntrepreneurPulse.com.au  Fact Sheet for Healthcare Providers: IncredibleEmployment.be  This test is not yet approved or cleared by the [place] FDA and has been authorized for detection and/or diagnosis of SARS-CoV-2 by FDA under an Emergency Use Authorization (EUA). This EUA will remain in effect (meaning this test can be used) for the duration of the COVID-19 declaration under Section 564(b)(1) of the Act, 21 U.S.C. section 360bbb-3(b)(1), unless the authorization is terminated or revoked.  Performed at [facility], [address] [place], [place] [address]      Radiology Studies: No results found.  Scheduled Meds: . aspirin EC  325 mg Oral Q breakfast  . atorvastatin  40 mg Oral q1800  . carvedilol  6.25 mg Oral BID WC  . clopidogrel  75 mg Oral Daily  . divalproex  250 mg Oral BID  . docusate sodium  100 mg Oral BID  . escitalopram  10 mg Oral Daily  . insulin glargine  14 Units Subcutaneous QHS  . traMADol  50 mg  Oral Q6H  . vitamin B-12  1,000 mcg Oral Daily   Continuous Infusions: . sodium chloride 75 mL/hr at 11/14/20 0807     LOS: 4 days   Max Sane, MD [facility] Pager On Amion  If 7PM-7AM, please contact night-coverage 11/14/2020, 4:33 PM

## 2020-11-14 NOTE — Progress Notes (Signed)
Physical Therapy Treatment Patient Details Name: Shawna Lynch MRN: [id number] DOB: [birth year] Today's Date: 11/14/2020    History of Present Illness Pt admitted for R hip fracture after tripping and falling over dog. Pt now s/p R hip hemiarthroplasty on 11/11/20. History includes dementia, anxiety, CAD s/p PCI, and depression. Pt is poor historian, daughter at bedside.    PT Comments    Pt is making slight improvement this date, very pleasant although tearful during session. Able to perform supine there-ex, requires max assist and able to sit at EOB. 1 attempt for standing, however couldn't tolerate fully upright posture. Returned to bed. Will continue to progress as able.   Follow Up Recommendations  Home health PT;Supervision/Assistance - 24 hour     Equipment Recommendations  Hospital bed;Wheelchair (measurements PT)    Recommendations for Other Services       Precautions / Restrictions Precautions Precautions: Fall;Posterior Hip Precaution Booklet Issued: No Restrictions Weight Bearing Restrictions: Yes RLE Weight Bearing: Weight bearing as tolerated    Mobility  Bed Mobility Overal bed mobility: Needs Assistance Bed Mobility: Supine to Sit;Sit to Supine     Supine to sit: Max assist Sit to supine: Total assist   General bed mobility comments: needs assist for sliding B LE off bed. total assist required for trunkal elevation. Once seated at EOB, heavy R lateral lean with cues for correction. Per daughter, this is baseline. When returned to supine, needs total assist for repositioning    Transfers Overall transfer level: Needs assistance Equipment used: None Transfers: Sit to/from Stand Sit to Stand: Max assist         General transfer comment: unable to follow commands consistently for hand placement. Attempt for standing, acheived squat positioning. Unable to stand upright. Not safe for further attempts.  Ambulation/Gait             General Gait  Details: unsafe   Stairs             Wheelchair Mobility    Modified Rankin (Stroke Patients Only)       Balance Overall balance assessment: Needs assistance;History of Falls Sitting-balance support: Feet supported;Bilateral upper extremity supported Sitting balance-Leahy Scale: Poor Sitting balance - Comments: R lateral leaning noted   Standing balance support: Bilateral upper extremity supported Standing balance-Leahy Scale: Zero                              Cognition Arousal/Alertness: Awake/alert Behavior During Therapy: WFL for tasks assessed/performed Overall Cognitive Status: History of cognitive impairments - at baseline                                 General Comments: pt more awake and answers simple questions. Still unable to recognize family in room.      Exercises Other Exercises Other Exercises: supine ther-ex performed on B LE including SLRs, hip abd/add, and heel slides.All ther-ex performed x 10 reps with max assist. Very tearful throughout, however when asked about pain, reports no pain.    General Comments        Pertinent Vitals/Pain Pain Assessment: Faces Faces Pain Scale: Hurts little more Pain Location: R LE with movement Pain Descriptors / Indicators: Discomfort;Grimacing;Guarding Pain Intervention(s): Limited activity within patient's tolerance;Repositioned    Home Living  Prior Function            PT Goals (current goals can now be found in the care plan section) Acute Rehab PT Goals Patient Stated Goal: unable to state PT Goal Formulation: Patient unable to participate in goal setting Time For Goal Achievement: 11/26/20 Potential to Achieve Goals: Fair Progress towards PT goals: Progressing toward goals    Frequency    7X/week      PT Plan Current plan remains appropriate    Co-evaluation              AM-PAC PT "6 Clicks" Mobility   Outcome Measure   Help needed turning from your back to your side while in a flat bed without using bedrails?: Total Help needed moving from lying on your back to sitting on the side of a flat bed without using bedrails?: Total Help needed moving to and from a bed to a chair (including a wheelchair)?: Total Help needed standing up from a chair using your arms (e.g., wheelchair or bedside chair)?: Total Help needed to walk in hospital room?: Total Help needed climbing 3-5 steps with a railing? : Total 6 Click Score: 6    End of Session Equipment Utilized During Treatment:  (hip abduction brace) Activity Tolerance: No increased pain Patient left: in bed;with bed alarm set;with family/visitor present Nurse Communication: Mobility status PT Visit Diagnosis: Muscle weakness (generalized) (M62.81);History of falling (Z91.81);Difficulty in walking, not elsewhere classified (R26.2);Pain Pain - Right/Left: Right Pain - part of body: Hip     Time: 8453-6468 PT Time Calculation (min) (ACUTE ONLY): 23 min  Charges:  $Therapeutic Exercise: 8-22 mins $Therapeutic Activity: 8-22 mins                     Shawna Lynch, PT, DPT [phone]    Shawna Lynch 11/14/2020, 12:20 PM

## 2020-11-14 NOTE — Progress Notes (Signed)
  Subjective: 3 Days Post-Op Procedure(s) (LRB): ARTHROPLASTY BIPOLAR HIP (HEMIARTHROPLASTY) (Right) Patient reports pain as mild.   Patient is well, and has had no acute complaints or problems Plan is to go Home after hospital stay.  Patient was at home prior to her fall with 24-hour assistance.  Daughter feels as if they have resources to continue care at home. Negative for chest pain and shortness of breath Fever: no Gastrointestinal: Negative for nausea and vomiting  Objective: Vital signs in last 24 hours: Temp:  [97.5 F (36.4 C)-98.6 F (37 C)] 98.4 F (36.9 C) (02/16 0552) Pulse Rate:  [63-92] 80 (02/16 0552) Resp:  [15-18] 16 (02/16 0552) BP: (106-141)/(50-69) 136/66 (02/16 0552) SpO2:  [91 %-100 %] 94 % (02/16 0552)  Intake/Output from previous day:  Intake/Output Summary (Last 24 hours) at 11/14/2020 0707 Last data filed at 11/14/2020 0555 Gross per 24 hour  Intake 1036 ml  Output 400 ml  Net 636 ml    Intake/Output this shift: No intake/output data recorded.  Labs: Recent Labs    11/12/20 0629 11/13/20 0527 11/14/20 0541  HGB 8.0* 6.7* 9.9*   Recent Labs    11/13/20 0527 11/14/20 0541  WBC 8.7 8.6  RBC 2.16* 3.21*  HCT 20.3* 29.1*  PLT 125* 166   Recent Labs    11/13/20 0527 11/14/20 0541  NA 139 141  K 3.7 3.4*  CL 110 109  CO2 24 25  BUN 25* 20  CREATININE 0.94 0.75  GLUCOSE 161* 129*  CALCIUM 8.0* 8.1*   No results for input(s): LABPT, INR in the last 72 hours.   EXAM General - Patient is Confused Extremity - Neurovascular intact Sensation intact distally Dorsiflexion/Plantar flexion intact Compartment soft  Dressing/Incision - clean, dry, no drainage Motor Function - intact, moving foot and toes well on exam.   Past Medical History:  Diagnosis Date  . Anxiety   . Arthritis    oa  . Coronary artery disease    04/06/17 PCI with DES to [facility], 90% in diag from LAD, nonobstructive disease in LAD, RCA. Normal EF.   [name] Dementia  ([place])   . Depression   . Diabetes mellitus without complication ([place])   . Hypertension   . NSTEMI (non-ST elevated myocardial infarction) ([place])   . Ringing in ears   . TIA (transient ischemic attack)     Assessment/Plan: 3 Days Post-Op Procedure(s) (LRB): ARTHROPLASTY BIPOLAR HIP (HEMIARTHROPLASTY) (Right) Active Problems:   Closed right hip fracture (HCC)  Estimated body mass index is 20 kg/m as calculated from the following:   Height as of this encounter: 5\' 6"  (1.676 m).   Weight as of this encounter: 56.2 kg. Advance diet Up with therapy  Pain well controlled Vital signs are stable  Acute blood loss anemia.  Hemoglobin 9.9.  Patient received 1 unit packed red blood cells yesterday.   Care manager to assist with discharge to home with home health pending medical clearance  Follow-up at [facility] clinic orthopedics in 2 weeks for staple removal and right hip x-rays.  DVT Prophylaxis - Foot Pumps, TED hose and Plavix Weight-Bearing as tolerated to right leg  Reche Dixon, PA-C Orthopaedic Surgery 11/14/2020, 7:07 AM

## 2020-11-14 NOTE — TOC Progression Note (Signed)
Transition of Care [facility]) - Progression Note    Patient Details  Name: Shawna Lynch MRN: [id number] Date of Birth: 1934/12/10  Transition of Care [facility]) CM/SW [place], RN Phone Number: 11/14/2020, 9:02 AM  Clinical Narrative:   RNCM reached out to [place] at [facility], she reports family came and initialized paperwork to move patient in to their facility yesterday. She is going to check with her business office and see how soon this move can take place and then will return call to this CM.     Expected Discharge Plan: Memory Care Barriers to Discharge: No Barriers Identified  Expected Discharge Plan and Services Expected Discharge Plan: Memory Care       Living arrangements for the past 2 months: Single Family Home                                       Social Determinants of Health (SDOH) Interventions    Readmission Risk Interventions No flowsheet data found.

## 2020-11-15 LAB — BASIC METABOLIC PANEL
Anion gap: 9 (ref 5–15)
BUN: 12 mg/dL (ref 8–23)
CO2: 24 mmol/L (ref 22–32)
Calcium: 8 mg/dL — ABNORMAL LOW (ref 8.9–10.3)
Chloride: 105 mmol/L (ref 98–111)
Creatinine, Ser: 0.67 mg/dL (ref 0.44–1.00)
GFR, Estimated: >60 mL/min (ref >60)
Glucose, Bld: 68 mg/dL — ABNORMAL LOW (ref 70–99)
Potassium: 3.1 mmol/L — ABNORMAL LOW (ref 3.5–5.1)
Sodium: 138 mmol/L (ref 135–145)

## 2020-11-15 LAB — CBC
HCT: 28 % — ABNORMAL LOW (ref 36.0–46.0)
Hemoglobin: 9.8 g/dL — ABNORMAL LOW (ref 12.0–15.0)
MCH: 31.4 pg (ref 26.0–34.0)
MCHC: 35 g/dL (ref 30.0–36.0)
MCV: 89.7 fL (ref 80.0–100.0)
Platelets: 184 10*3/uL (ref 150–400)
RBC: 3.12 MIL/uL — ABNORMAL LOW (ref 3.87–5.11)
RDW: 14.5 % (ref 11.5–15.5)
WBC: 9.4 10*3/uL (ref 4.0–10.5)
nRBC: 0 % (ref 0.0–0.2)

## 2020-11-15 LAB — RESP PANEL BY RT-PCR (FLU A&B, COVID) ARPGX2
Influenza A by PCR: NEGATIVE
Influenza B by PCR: NEGATIVE
SARS Coronavirus 2 by RT PCR: NEGATIVE

## 2020-11-15 LAB — SURGICAL PATHOLOGY

## 2020-11-15 MED ORDER — POTASSIUM CHLORIDE CRYS ER 20 MEQ PO TBCR
40.0000 meq | EXTENDED_RELEASE_TABLET | Freq: Once | ORAL | Status: AC
  Administered 2020-11-15: 40 meq via ORAL
  Filled 2020-11-15: qty 2

## 2020-11-15 MED ORDER — SENNOSIDES-DOCUSATE SODIUM 8.6-50 MG PO TABS: 1.0000 | ORAL_TABLET | Freq: Two times a day (BID) | ORAL | 0 refills | Status: AC

## 2020-11-15 NOTE — Progress Notes (Signed)
Pt d/c to [facility] this afternoon via [facility] transport services.  IV removed from pt L forearm without issue.  NAD noted at d/c VS stable.

## 2020-11-15 NOTE — Progress Notes (Signed)
[facility]) Hospital Liaison RN note:  Received new referral for [facility] out patient palliative program to follow post discharge from Dr. Max Sane. Referral has the patient's information. Misty Green, TOC is aware. Plan is to discharge today to [facility].  Thank you for this referral.  Zandra Abts, RN [facility] Liaison [phone]

## 2020-11-15 NOTE — Progress Notes (Signed)
Physical Therapy Treatment Patient Details Name: Shawna Lynch MRN: [id number] DOB: 02-13-1935 Today's Date: 11/15/2020    History of Present Illness Pt admitted for R hip fracture after tripping and falling over dog. Pt now s/p R hip hemiarthroplasty on 11/11/20. History includes dementia, anxiety, CAD s/p PCI, and depression. Pt is poor historian, daughter at bedside.    PT Comments    Pt is making gradual progress towards goals with ability to tolerate sitting at EOB with decreased assist this date. Does fatigue quickly. More interaction for there-ex, however inconsistent and doesn't follow commands. Pleasant and agreeable, but yet still very emotional, tearful at times. Will progress as able.  Follow Up Recommendations  Home health PT;Supervision/Assistance - 24 hour     Equipment Recommendations  Hospital bed;Wheelchair (measurements PT)    Recommendations for Other Services       Precautions / Restrictions Precautions Precautions: Fall;Posterior Hip Precaution Booklet Issued: Yes (comment) Restrictions Weight Bearing Restrictions: Yes RLE Weight Bearing: Weight bearing as tolerated    Mobility  Bed Mobility Overal bed mobility: Needs Assistance Bed Mobility: Supine to Sit;Sit to Supine     Supine to sit: Max assist Sit to supine: Total assist   General bed mobility comments: slight improved participation this date. Once seated at EOB, able to maintain upright posture with only min assist for a few mins prior to fatigue, then needed increased assist.    Transfers                 General transfer comment: too fatigued to attempt  Ambulation/Gait                 Stairs             Wheelchair Mobility    Modified Rankin (Stroke Patients Only)       Balance Overall balance assessment: Needs assistance;History of Falls Sitting-balance support: Feet supported;Bilateral upper extremity supported Sitting balance-Leahy Scale: Fair Sitting  balance - Comments: R lateral leaning noted                                    Cognition Arousal/Alertness: Awake/alert Behavior During Therapy: WFL for tasks assessed/performed Overall Cognitive Status: History of cognitive impairments - at baseline                                 General Comments: still emotional this date. Basline confusion.      Exercises Other Exercises Other Exercises: Supine ther-ex performed on B LE including SLRs, hip abd/add, heel slides, and SAQ. All ther-ex performed x 10 reps with slight active movement noted. Still needs max assist. Other Exercises: Able to sit at EOB to take medicine with RN    General Comments        Pertinent Vitals/Pain Pain Assessment: No/denies pain    Home Living                      Prior Function            PT Goals (current goals can now be found in the care plan section) Acute Rehab PT Goals Patient Stated Goal: unable to state PT Goal Formulation: Patient unable to participate in goal setting Time For Goal Achievement: 11/26/20 Potential to Achieve Goals: Fair Progress towards PT goals: Progressing toward goals    Frequency  7X/week      PT Plan Current plan remains appropriate    Co-evaluation              AM-PAC PT "6 Clicks" Mobility   Outcome Measure  Help needed turning from your back to your side while in a flat bed without using bedrails?: Total Help needed moving from lying on your back to sitting on the side of a flat bed without using bedrails?: Total Help needed moving to and from a bed to a chair (including a wheelchair)?: Total Help needed standing up from a chair using your arms (e.g., wheelchair or bedside chair)?: Total Help needed to walk in hospital room?: Total Help needed climbing 3-5 steps with a railing? : Total 6 Click Score: 6    End of Session Equipment Utilized During Treatment:  (hip abduction brace) Activity Tolerance: No  increased pain Patient left: in bed;with bed alarm set;with family/visitor present Nurse Communication: Mobility status PT Visit Diagnosis: Muscle weakness (generalized) (M62.81);History of falling (Z91.81);Difficulty in walking, not elsewhere classified (R26.2);Pain Pain - Right/Left: Right Pain - part of body: Hip     Time: 1165-7903 PT Time Calculation (min) (ACUTE ONLY): 25 min  Charges:  $Therapeutic Exercise: 8-22 mins $Therapeutic Activity: 8-22 mins                     Greggory Stallion, PT, DPT [phone]    Shawna Lynch 11/15/2020, 12:23 PM

## 2020-11-15 NOTE — NC FL2 (Signed)
[place] LEVEL OF CARE SCREENING TOOL     IDENTIFICATION  Patient Name: TEFFANY BLASZCZYK Birthdate: [birth year] Sex: female Admission Date (Current Location): 11/10/2020  [place] and [place] Number:  Engineering geologist and Address:  [facility], [address], [place], [place] [address]      Provider Number: [id number]  Attending Physician Name and Address:  Max Sane, MD  Relative Name and Phone Number:  Fenton Malling [phone]    Current Level of Care: Hospital Recommended Level of Care: Memory Care Prior Approval Number:    Date Approved/Denied:   PASRR Number:    Discharge Plan: Other (Comment) (Memory Care)    Current Diagnoses: Patient Active Problem List   Diagnosis Date Noted  . Closed right hip fracture ([place]) 11/10/2020  . Vascular dementia without behavioral disturbance ([place]) 09/12/2020  . Depression 09/12/2020  . Acute ischemic stroke ([place]) - R brain stroke not seen on imaging 08/19/2020  . TIA (transient ischemic attack) 08/13/2020  . Mild cognitive impairment 03/09/2018  . Pruritus 03/09/2018  . CAD S/P percutaneous coronary angioplasty 04/27/2017  . Dementia ([place]) 04/27/2017  . Dyslipidemia 04/07/2017  . Non-insulin treated type 2 diabetes mellitus ([place]) 04/06/2017  . Essential hypertension 04/06/2017  . History of non-ST elevation myocardial infarction (NSTEMI) 04/03/2017    Orientation RESPIRATION BLADDER Height & Weight     Self  Normal External catheter Weight: 56.2 kg Height:  5\' 6"  (167.6 cm)  BEHAVIORAL SYMPTOMS/MOOD NEUROLOGICAL BOWEL NUTRITION STATUS      Continent Diet (Regular)  AMBULATORY STATUS COMMUNICATION OF NEEDS Skin   Extensive Assist Verbally Surgical wounds                       Personal Care Assistance Level of Assistance  Bathing,Feeding,Dressing Bathing Assistance: Maximum assistance Feeding assistance: Limited assistance Dressing Assistance: Maximum assistance      Functional Limitations Info             SPECIAL CARE FACTORS FREQUENCY  PT (By licensed PT),OT (By licensed OT)                    Contractures Contractures Info: Not present    Additional Factors Info  Code Status,Allergies Code Status Info: DNR Allergies Info: Penicillin, Sulfa, Vicodin, Codeien           Current Medications (11/15/2020):   Discharge Medications: TAKE these medications   aspirin 81 MG EC tablet Take 1 tablet (81 mg total) by mouth daily.   atorvastatin 80 MG tablet Commonly known as: LIPITOR Take 0.5 tablets (40 mg total) by mouth daily at 6 PM.   carvedilol 6.25 MG tablet Commonly known as: COREG Take 6.25 mg by mouth 2 (two) times daily.   clopidogrel 75 MG tablet Commonly known as: PLAVIX Take 75 mg by mouth daily.   divalproex 250 MG DR tablet Commonly known as: DEPAKOTE Take 1 tablet (250 mg total) by mouth 2 (two) times daily.   escitalopram 10 MG tablet Commonly known as: LEXAPRO Take 10 mg by mouth daily.   Lantus SoloStar 100 UNIT/ML Solostar Pen Generic drug: insulin glargine Inject 14 Units into the skin at bedtime.   nitroGLYCERIN 0.4 MG SL tablet Commonly known as: NITROSTAT Place 1 tablet (0.4 mg total) under the tongue every 5 (five) minutes x 3 doses as needed for chest pain. What changed:   when to take this  reasons to take this   ondansetron 4 MG  tablet Commonly known as: Zofran Take 1 tablet (4 mg total) by mouth daily as needed for nausea or vomiting.   oxyCODONE 5 MG immediate release tablet Commonly known as: Oxy IR/ROXICODONE Take 0.5-1 tablets (2.5-5 mg total) by mouth every 4 (four) hours as needed for moderate pain (pain score 4-6).   senna-docusate 8.6-50 MG tablet Commonly known as: Senokot-S Take 1 tablet by mouth 2 (two) times daily for 14 days.   traMADol 50 MG tablet Commonly known as: ULTRAM Take 1 tablet (50 mg total) by mouth every 6 (six) hours.   Trulicity 1.5  WU/9.8JX Sopn Generic drug: Dulaglutide Inject 1.5 mg into the skin once a week. Monday     Relevant Imaging Results:  Relevant Lab Results:   Additional Information SS # [id number]  Shelbie Ammons, RN

## 2020-11-15 NOTE — Progress Notes (Signed)
  Subjective: 4 Days Post-Op Procedure(s) (LRB): ARTHROPLASTY BIPOLAR HIP (HEMIARTHROPLASTY) (Right) Patient reports pain as mild.   Patient is well, and has had no acute complaints or problems Plan is to go to home place after hospital stay.  Patient was at home prior to her fall with 24-hour assistance.  Daughter feels as if they have resources to continue care at home. Negative for chest pain and shortness of breath Fever: no Gastrointestinal: Negative for nausea and vomiting  Objective: Vital signs in last 24 hours: Temp:  [97.5 F (36.4 C)-98 F (36.7 C)] 97.6 F (36.4 C) (02/17 0411) Pulse Rate:  [76-89] 85 (02/17 0411) Resp:  [16] 16 (02/17 0411) BP: (138-171)/(68-84) 171/84 (02/17 0411) SpO2:  [91 %-100 %] 91 % (02/17 0411)  Intake/Output from previous day:  Intake/Output Summary (Last 24 hours) at 11/15/2020 0659 Last data filed at 11/15/2020 0636 Gross per 24 hour  Intake 0 ml  Output 1975 ml  Net -1975 ml    Intake/Output this shift: Total I/O In: -  Out: 1475 [Urine:1475]  Labs: Recent Labs    11/13/20 0527 11/14/20 0541  HGB 6.7* 9.9*   Recent Labs    11/13/20 0527 11/14/20 0541  WBC 8.7 8.6  RBC 2.16* 3.21*  HCT 20.3* 29.1*  PLT 125* 166   Recent Labs    11/13/20 0527 11/14/20 0541  NA 139 141  K 3.7 3.4*  CL 110 109  CO2 24 25  BUN 25* 20  CREATININE 0.94 0.75  GLUCOSE 161* 129*  CALCIUM 8.0* 8.1*   No results for input(s): LABPT, INR in the last 72 hours.   EXAM General - Patient is Confused Extremity - Neurovascular intact Sensation intact distally Dorsiflexion/Plantar flexion intact Compartment soft  Dressing/Incision - clean, dry, no drainage Motor Function - intact, moving foot and toes well on exam.   Past Medical History:  Diagnosis Date  . Anxiety   . Arthritis    oa  . Coronary artery disease    04/06/17 PCI with DES to [facility], 90% in diag from LAD, nonobstructive disease in LAD, RCA. Normal EF.   [name] Dementia ([place])    . Depression   . Diabetes mellitus without complication ([place])   . Hypertension   . NSTEMI (non-ST elevated myocardial infarction) ([place])   . Ringing in ears   . TIA (transient ischemic attack)     Assessment/Plan: 4 Days Post-Op Procedure(s) (LRB): ARTHROPLASTY BIPOLAR HIP (HEMIARTHROPLASTY) (Right) Active Problems:   Closed right hip fracture (HCC)  Estimated body mass index is 20 kg/m as calculated from the following:   Height as of this encounter: 5\' 6"  (1.676 m).   Weight as of this encounter: 56.2 kg. Advance diet Up with therapy  Pain well controlled Vital signs are stable  Acute blood loss anemia.  Hemoglobin 9.9.  Received 1 unit of transfused blood. Care manager to assist with discharge to rehab  Follow-up at [facility] clinic orthopedics in 2 weeks for staple removal and right hip x-rays.  DVT Prophylaxis - Foot Pumps, TED hose and Plavix Weight-Bearing as tolerated to right leg  Reche Dixon, PA-C Orthopaedic Surgery 11/15/2020, 6:59 AM

## 2020-11-15 NOTE — Discharge Summary (Signed)
Shawna Lynch at [place] NAME: Shawna Lynch    MR#:  [id number]  DATE OF BIRTH:  01/31/1935  DATE OF ADMISSION:  11/10/2020   ADMITTING PHYSICIAN: Christel Mormon, MD  DATE OF DISCHARGE: 11/15/2020  PRIMARY CARE PHYSICIAN: Philmore Pali, NP   ADMISSION DIAGNOSIS:  Closed right hip fracture ([place]) [S72.001A] Other closed fracture of shaft of right femur, initial encounter ([place]) [S72.391A] DISCHARGE DIAGNOSIS:  Active Problems:   Closed right hip fracture ([name])  SECONDARY DIAGNOSIS:   Past Medical History:  Diagnosis Date  . Anxiety   . Arthritis    oa  . Coronary artery disease    04/06/17 PCI with DES to [facility], 90% in diag from LAD, nonobstructive disease in LAD, RCA. Normal EF.   [name] Dementia ([place])   . Depression   . Diabetes mellitus without complication ([place])   . Hypertension   . NSTEMI (non-ST elevated myocardial infarction) ([place])   . Ringing in ears   . TIA (transient ischemic attack)    HOSPITAL COURSE:  86 y.o.Caucasian femalewith medical history significant forosteoarthritis,anxiety, coronary artery disease status post PCI and stent, dementia, depression, type II hypertension and TIA admitted with acute onset of right hip pain after having a mechanical fall tripping on her dog and falling on her right hip. In the ED, pt was found to have R mid-femoral neck fracture.   1. Closed right hip fracture secondary to mechanical fall. - s/p R hip arthoplasty on 2/13 -outpt f/up with ortho in 2 weeks  2. Hypertensive urgency POA - likely secondary to her pain, now resolved.  3. Coronary artery disease status post PCI and stent.  -continue on nitroglycerin, beta-blocker therapy with Coreg, statin therapy  - aspirin and Plavix resume at DC  4. Type 2 diabetes mellitus without complications. - hold off her Metformin at D/C as blood sugar remains low. Continue insulin at D/C  5. Depression. -Continued on home Lexapro and Depakote.  6.  Dyslipidemia. - continue statin therapy.  7. Vitamin B12 deficiency. - continue her vitamin B12.  8. Acute blood loss anemia -due to blood loss secondary to surgery -Status post 2 units of packed red blood cell transfusion on 2/15 with a hemoglobin of 9.8 this morning.  9. ARF - POA and resolved with hydration.  10.  Hypokalemia Repleted    DISCHARGE CONDITIONS:  stable CONSULTS OBTAINED:  Treatment Team:  Leim Fabry, MD DRUG ALLERGIES:   Allergies  Allergen Reactions  . Penicillins Hives  . Sulfa Antibiotics Rash  . Vicodin [Hydrocodone-Acetaminophen] Nausea And Vomiting  . Codeine Rash   DISCHARGE MEDICATIONS:   Allergies as of 11/15/2020      Reactions   Penicillins Hives   Sulfa Antibiotics Rash   Vicodin [hydrocodone-acetaminophen] Nausea And Vomiting   Codeine Rash      Medication List    STOP taking these medications   cholecalciferol 1000 units tablet Commonly known as: VITAMIN D   metFORMIN 500 MG tablet Commonly known as: GLUCOPHAGE   omega-3 acid ethyl esters 1 g capsule Commonly known as: LOVAZA   ticagrelor 90 MG Tabs tablet Commonly known as: Brilinta   vitamin B-12 1000 MCG tablet Commonly known as: CYANOCOBALAMIN     TAKE these medications   aspirin 81 MG EC tablet Take 1 tablet (81 mg total) by mouth daily.   atorvastatin 80 MG tablet Commonly known as: LIPITOR Take 0.5 tablets (40 mg total) by mouth daily at 6 PM.  carvedilol 6.25 MG tablet Commonly known as: COREG Take 6.25 mg by mouth 2 (two) times daily.   clopidogrel 75 MG tablet Commonly known as: PLAVIX Take 75 mg by mouth daily.   divalproex 250 MG DR tablet Commonly known as: DEPAKOTE Take 1 tablet (250 mg total) by mouth 2 (two) times daily.   escitalopram 10 MG tablet Commonly known as: LEXAPRO Take 10 mg by mouth daily.   Lantus SoloStar 100 UNIT/ML Solostar Pen Generic drug: insulin glargine Inject 14 Units into the skin at bedtime.    nitroGLYCERIN 0.4 MG SL tablet Commonly known as: NITROSTAT Place 1 tablet (0.4 mg total) under the tongue every 5 (five) minutes x 3 doses as needed for chest pain. What changed:   when to take this  reasons to take this   ondansetron 4 MG tablet Commonly known as: Zofran Take 1 tablet (4 mg total) by mouth daily as needed for nausea or vomiting.   oxyCODONE 5 MG immediate release tablet Commonly known as: Oxy IR/ROXICODONE Take 0.5-1 tablets (2.5-5 mg total) by mouth every 4 (four) hours as needed for moderate pain (pain score 4-6).   senna-docusate 8.6-50 MG tablet Commonly known as: Senokot-S Take 1 tablet by mouth 2 (two) times daily for 14 days.   traMADol 50 MG tablet Commonly known as: ULTRAM Take 1 tablet (50 mg total) by mouth every 6 (six) hours.   Trulicity 1.5 GE/9.5MW Sopn Generic drug: Dulaglutide Inject 1.5 mg into the skin once a week. Monday      DISCHARGE INSTRUCTIONS:   DIET:  Regular diet DISCHARGE CONDITION:  Stable ACTIVITY:  Activity as tolerated  Weight-Bearing as tolerated to right leg OXYGEN:  Home Oxygen: No.  Oxygen Delivery: room air DISCHARGE LOCATION:  [facility], OT and Palliative care to follow   If you experience worsening of your admission symptoms, develop shortness of breath, life threatening emergency, suicidal or homicidal thoughts you must seek medical attention immediately by calling 911 or calling your MD immediately  if symptoms less severe.  You Must read complete instructions/literature along with all the possible adverse reactions/side effects for all the Medicines you take and that have been prescribed to you. Take any new Medicines after you have completely understood and accpet all the possible adverse reactions/side effects.   Please note  You were cared for by a hospitalist during your hospital stay. If you have any questions about your discharge medications or the care you received while you  were in the hospital after you are discharged, you can call the unit and asked to speak with the hospitalist on call if the hospitalist that took care of you is not available. Once you are discharged, your primary care physician will handle any further medical issues. Please note that NO REFILLS for any discharge medications will be authorized once you are discharged, as it is imperative that you return to your primary care physician (or establish a relationship with a primary care physician if you do not have one) for your aftercare needs so that they can reassess your need for medications and monitor your lab values.    On the day of Discharge:  VITAL SIGNS:  Blood pressure (!) 148/91, pulse 85, temperature 98.7 F (37.1 C), resp. rate 15, height 5\' 6"  (1.676 m), weight 56.2 kg, SpO2 96 %. PHYSICAL EXAMINATION:  GENERAL:  86 y.o.-year-old patient lying in the bed with no acute distress.  EYES: Pupils equal, round, reactive to light and accommodation.  No scleral icterus. Extraocular muscles intact.  HEENT: Head atraumatic, normocephalic. Oropharynx and nasopharynx clear.  NECK:  Supple, no jugular venous distention. No thyroid enlargement, no tenderness.  LUNGS: Normal breath sounds bilaterally, no wheezing, rales,rhonchi or crepitation. No use of accessory muscles of respiration.  CARDIOVASCULAR: S1, S2 normal. No murmurs, rubs, or gallops.  ABDOMEN: Soft, non-tender, non-distended. Bowel sounds present. No organomegaly or mass.  EXTREMITIES: No pedal edema, cyanosis, or clubbing.  NEUROLOGIC: Cranial nerves II through XII are intact. Muscle strength 5/5 in all extremities. Sensation intact. Gait not checked.  PSYCHIATRIC: The patient is alert and oriented x 3.  SKIN: No obvious rash, lesion, or ulcer.  DATA REVIEW:   CBC Recent Labs  Lab 11/15/20 0835  WBC 9.4  HGB 9.8*  HCT 28.0*  PLT 184    Chemistries  Recent Labs  Lab 11/14/20 0541  NA 141  K 3.4*  CL 109  CO2 25   GLUCOSE 129*  BUN 20  CREATININE 0.75  CALCIUM 8.1*     Outpatient follow-up  Follow-up Information    Reche Dixon, PA-C Follow up in 2 week(s).   Specialty: Orthopedic Surgery Why: For staple removal and right hip x-rays. Contact information: [address] [place] [address] [phone]        Philmore Pali, NP. Schedule an appointment as soon as possible for a visit in 1 week(s).   Specialty: Nurse Practitioner Contact information: [place] [address] [phone]               30 Day Unplanned Readmission Risk Score   Flowsheet Row ED to Hosp-Admission (Current) from 11/10/2020 in [place] (1A)  30 Day Unplanned Readmission Risk Score (%) 21 Filed at 11/15/2020 0801     This score is the patient's risk of an unplanned readmission within 30 days of being discharged (0 -100%). The score is based on dignosis, age, lab data, medications, orders, and past utilization.   Low:  0-14.9   Medium: 15-21.9   High: 22-29.9   Extreme: 30 and above         Management plans discussed with the patient, family and they are in agreement.  CODE STATUS: DNR   TOTAL TIME TAKING CARE OF THIS PATIENT: 45 minutes.    Max Sane M.D on 11/15/2020 at 9:07 AM  [facility]   CC: Primary care physician; Philmore Pali, NP   Note: This dictation was prepared with Dragon dictation along with smaller phrase technology. Any transcriptional errors that result from this process are unintentional.

## 2020-11-21 DIAGNOSIS — F0150 Vascular dementia without behavioral disturbance: Secondary | ICD-10-CM | POA: Diagnosis not present

## 2020-11-21 DIAGNOSIS — F329 Major depressive disorder, single episode, unspecified: Secondary | ICD-10-CM | POA: Diagnosis not present

## 2020-11-21 DIAGNOSIS — I2510 Atherosclerotic heart disease of native coronary artery without angina pectoris: Secondary | ICD-10-CM | POA: Diagnosis not present

## 2020-11-21 DIAGNOSIS — Z96641 Presence of right artificial hip joint: Secondary | ICD-10-CM | POA: Diagnosis not present

## 2020-11-21 DIAGNOSIS — M6281 Muscle weakness (generalized): Secondary | ICD-10-CM | POA: Diagnosis not present

## 2020-11-21 DIAGNOSIS — E119 Type 2 diabetes mellitus without complications: Secondary | ICD-10-CM | POA: Diagnosis not present

## 2020-11-21 DIAGNOSIS — D519 Vitamin B12 deficiency anemia, unspecified: Secondary | ICD-10-CM | POA: Diagnosis not present

## 2020-11-21 DIAGNOSIS — Z794 Long term (current) use of insulin: Secondary | ICD-10-CM | POA: Diagnosis not present

## 2020-11-21 DIAGNOSIS — E559 Vitamin D deficiency, unspecified: Secondary | ICD-10-CM | POA: Diagnosis not present

## 2020-11-22 ENCOUNTER — Other Ambulatory Visit: Payer: Self-pay

## 2020-11-22 NOTE — Patient Outreach (Signed)
[facility]) Care Management  11/22/2020  BREYON BLASS 10-12-1934 [phone]   Telephone outreach to patient to obtain mRS was successfully completed. MRS= 5  Thank you, [place] Care Management Assistant

## 2020-11-28 DIAGNOSIS — I2510 Atherosclerotic heart disease of native coronary artery without angina pectoris: Secondary | ICD-10-CM | POA: Diagnosis not present

## 2020-11-28 DIAGNOSIS — E1122 Type 2 diabetes mellitus with diabetic chronic kidney disease: Secondary | ICD-10-CM | POA: Diagnosis not present

## 2020-11-28 DIAGNOSIS — Z7189 Other specified counseling: Secondary | ICD-10-CM | POA: Diagnosis not present

## 2020-11-28 DIAGNOSIS — S72391D Other fracture of shaft of right femur, subsequent encounter for closed fracture with routine healing: Secondary | ICD-10-CM | POA: Diagnosis not present

## 2020-11-28 DIAGNOSIS — S72001D Fracture of unspecified part of neck of right femur, subsequent encounter for closed fracture with routine healing: Secondary | ICD-10-CM | POA: Diagnosis not present

## 2020-11-28 DIAGNOSIS — Z96641 Presence of right artificial hip joint: Secondary | ICD-10-CM | POA: Diagnosis not present

## 2020-11-28 DIAGNOSIS — I129 Hypertensive chronic kidney disease with stage 1 through stage 4 chronic kidney disease, or unspecified chronic kidney disease: Secondary | ICD-10-CM | POA: Diagnosis not present

## 2020-11-28 DIAGNOSIS — F329 Major depressive disorder, single episode, unspecified: Secondary | ICD-10-CM | POA: Diagnosis not present

## 2020-11-28 DIAGNOSIS — M810 Age-related osteoporosis without current pathological fracture: Secondary | ICD-10-CM | POA: Diagnosis not present

## 2020-11-28 DIAGNOSIS — F0150 Vascular dementia without behavioral disturbance: Secondary | ICD-10-CM | POA: Diagnosis not present

## 2020-11-28 DIAGNOSIS — N1832 Chronic kidney disease, stage 3b: Secondary | ICD-10-CM | POA: Diagnosis not present

## 2020-12-03 DIAGNOSIS — M6281 Muscle weakness (generalized): Secondary | ICD-10-CM | POA: Diagnosis not present

## 2020-12-03 DIAGNOSIS — E119 Type 2 diabetes mellitus without complications: Secondary | ICD-10-CM | POA: Diagnosis not present

## 2020-12-03 DIAGNOSIS — E559 Vitamin D deficiency, unspecified: Secondary | ICD-10-CM | POA: Diagnosis not present

## 2020-12-03 DIAGNOSIS — I10 Essential (primary) hypertension: Secondary | ICD-10-CM | POA: Diagnosis not present

## 2020-12-04 DIAGNOSIS — I2510 Atherosclerotic heart disease of native coronary artery without angina pectoris: Secondary | ICD-10-CM | POA: Diagnosis not present

## 2020-12-04 DIAGNOSIS — M810 Age-related osteoporosis without current pathological fracture: Secondary | ICD-10-CM | POA: Diagnosis not present

## 2020-12-04 DIAGNOSIS — M2550 Pain in unspecified joint: Secondary | ICD-10-CM | POA: Diagnosis not present

## 2020-12-04 DIAGNOSIS — S72391D Other fracture of shaft of right femur, subsequent encounter for closed fracture with routine healing: Secondary | ICD-10-CM | POA: Diagnosis not present

## 2020-12-04 DIAGNOSIS — I129 Hypertensive chronic kidney disease with stage 1 through stage 4 chronic kidney disease, or unspecified chronic kidney disease: Secondary | ICD-10-CM | POA: Diagnosis not present

## 2020-12-04 DIAGNOSIS — R404 Transient alteration of awareness: Secondary | ICD-10-CM | POA: Diagnosis not present

## 2020-12-04 DIAGNOSIS — S72001D Fracture of unspecified part of neck of right femur, subsequent encounter for closed fracture with routine healing: Secondary | ICD-10-CM | POA: Diagnosis not present

## 2020-12-04 DIAGNOSIS — F329 Major depressive disorder, single episode, unspecified: Secondary | ICD-10-CM | POA: Diagnosis not present

## 2020-12-04 DIAGNOSIS — Z7401 Bed confinement status: Secondary | ICD-10-CM | POA: Diagnosis not present

## 2020-12-04 DIAGNOSIS — N1832 Chronic kidney disease, stage 3b: Secondary | ICD-10-CM | POA: Diagnosis not present

## 2020-12-04 DIAGNOSIS — E1122 Type 2 diabetes mellitus with diabetic chronic kidney disease: Secondary | ICD-10-CM | POA: Diagnosis not present

## 2020-12-04 DIAGNOSIS — F0150 Vascular dementia without behavioral disturbance: Secondary | ICD-10-CM | POA: Diagnosis not present

## 2020-12-04 DIAGNOSIS — M25551 Pain in right hip: Secondary | ICD-10-CM | POA: Diagnosis not present

## 2020-12-05 DIAGNOSIS — E1122 Type 2 diabetes mellitus with diabetic chronic kidney disease: Secondary | ICD-10-CM | POA: Diagnosis not present

## 2020-12-05 DIAGNOSIS — S72001D Fracture of unspecified part of neck of right femur, subsequent encounter for closed fracture with routine healing: Secondary | ICD-10-CM | POA: Diagnosis not present

## 2020-12-05 DIAGNOSIS — M810 Age-related osteoporosis without current pathological fracture: Secondary | ICD-10-CM | POA: Diagnosis not present

## 2020-12-05 DIAGNOSIS — E440 Moderate protein-calorie malnutrition: Secondary | ICD-10-CM | POA: Diagnosis not present

## 2020-12-05 DIAGNOSIS — Z96641 Presence of right artificial hip joint: Secondary | ICD-10-CM | POA: Diagnosis not present

## 2020-12-05 DIAGNOSIS — F329 Major depressive disorder, single episode, unspecified: Secondary | ICD-10-CM | POA: Diagnosis not present

## 2020-12-05 DIAGNOSIS — I2510 Atherosclerotic heart disease of native coronary artery without angina pectoris: Secondary | ICD-10-CM | POA: Diagnosis not present

## 2020-12-05 DIAGNOSIS — S72391D Other fracture of shaft of right femur, subsequent encounter for closed fracture with routine healing: Secondary | ICD-10-CM | POA: Diagnosis not present

## 2020-12-05 DIAGNOSIS — F0150 Vascular dementia without behavioral disturbance: Secondary | ICD-10-CM | POA: Diagnosis not present

## 2020-12-05 DIAGNOSIS — I129 Hypertensive chronic kidney disease with stage 1 through stage 4 chronic kidney disease, or unspecified chronic kidney disease: Secondary | ICD-10-CM | POA: Diagnosis not present

## 2020-12-05 DIAGNOSIS — N1832 Chronic kidney disease, stage 3b: Secondary | ICD-10-CM | POA: Diagnosis not present

## 2020-12-07 ENCOUNTER — Other Ambulatory Visit: Payer: Self-pay

## 2020-12-07 ENCOUNTER — Emergency Department: Payer: Medicare HMO

## 2020-12-07 ENCOUNTER — Emergency Department: Payer: Medicare HMO | Admitting: Anesthesiology

## 2020-12-07 ENCOUNTER — Encounter
Admission: EM | Disposition: A | Discharge status: Skilled Nursing Facility | Payer: Self-pay | Source: Home / Self Care | Attending: Emergency Medicine

## 2020-12-07 ENCOUNTER — Ambulatory Visit
Admission: EM | Admit: 2020-12-07 | Discharge: 2020-12-07 | Disposition: A | Discharge status: Skilled Nursing Facility | Payer: Medicare HMO | Attending: Emergency Medicine | Admitting: Emergency Medicine

## 2020-12-07 DIAGNOSIS — Z87891 Personal history of nicotine dependence: Secondary | ICD-10-CM | POA: Diagnosis not present

## 2020-12-07 DIAGNOSIS — Y831 Surgical operation with implant of artificial internal device as the cause of abnormal reaction of the patient, or of later complication, without mention of misadventure at the time of the procedure: Secondary | ICD-10-CM | POA: Insufficient documentation

## 2020-12-07 DIAGNOSIS — F0150 Vascular dementia without behavioral disturbance: Secondary | ICD-10-CM | POA: Diagnosis not present

## 2020-12-07 DIAGNOSIS — Z7401 Bed confinement status: Secondary | ICD-10-CM | POA: Diagnosis not present

## 2020-12-07 DIAGNOSIS — Z20822 Contact with and (suspected) exposure to covid-19: Secondary | ICD-10-CM | POA: Insufficient documentation

## 2020-12-07 DIAGNOSIS — I2510 Atherosclerotic heart disease of native coronary artery without angina pectoris: Secondary | ICD-10-CM | POA: Diagnosis not present

## 2020-12-07 DIAGNOSIS — Z96641 Presence of right artificial hip joint: Secondary | ICD-10-CM | POA: Diagnosis not present

## 2020-12-07 DIAGNOSIS — E785 Hyperlipidemia, unspecified: Secondary | ICD-10-CM | POA: Diagnosis not present

## 2020-12-07 DIAGNOSIS — Z8673 Personal history of transient ischemic attack (TIA), and cerebral infarction without residual deficits: Secondary | ICD-10-CM | POA: Insufficient documentation

## 2020-12-07 DIAGNOSIS — Z7984 Long term (current) use of oral hypoglycemic drugs: Secondary | ICD-10-CM | POA: Insufficient documentation

## 2020-12-07 DIAGNOSIS — M2550 Pain in unspecified joint: Secondary | ICD-10-CM | POA: Diagnosis not present

## 2020-12-07 DIAGNOSIS — T84020A Dislocation of internal right hip prosthesis, initial encounter: Secondary | ICD-10-CM | POA: Insufficient documentation

## 2020-12-07 DIAGNOSIS — M25559 Pain in unspecified hip: Secondary | ICD-10-CM

## 2020-12-07 DIAGNOSIS — Z794 Long term (current) use of insulin: Secondary | ICD-10-CM | POA: Insufficient documentation

## 2020-12-07 DIAGNOSIS — Z882 Allergy status to sulfonamides status: Secondary | ICD-10-CM | POA: Insufficient documentation

## 2020-12-07 DIAGNOSIS — Z7902 Long term (current) use of antithrombotics/antiplatelets: Secondary | ICD-10-CM | POA: Diagnosis not present

## 2020-12-07 DIAGNOSIS — S73014A Posterior dislocation of right hip, initial encounter: Secondary | ICD-10-CM

## 2020-12-07 DIAGNOSIS — I10 Essential (primary) hypertension: Secondary | ICD-10-CM | POA: Diagnosis not present

## 2020-12-07 DIAGNOSIS — Z7982 Long term (current) use of aspirin: Secondary | ICD-10-CM | POA: Diagnosis not present

## 2020-12-07 DIAGNOSIS — Z880 Allergy status to penicillin: Secondary | ICD-10-CM | POA: Diagnosis not present

## 2020-12-07 DIAGNOSIS — Z885 Allergy status to narcotic agent status: Secondary | ICD-10-CM | POA: Insufficient documentation

## 2020-12-07 DIAGNOSIS — Z419 Encounter for procedure for purposes other than remedying health state, unspecified: Secondary | ICD-10-CM

## 2020-12-07 DIAGNOSIS — Z955 Presence of coronary angioplasty implant and graft: Secondary | ICD-10-CM | POA: Insufficient documentation

## 2020-12-07 DIAGNOSIS — S73004A Unspecified dislocation of right hip, initial encounter: Secondary | ICD-10-CM | POA: Diagnosis not present

## 2020-12-07 DIAGNOSIS — R000 Tachycardia, unspecified: Secondary | ICD-10-CM | POA: Diagnosis not present

## 2020-12-07 DIAGNOSIS — T07XXXA Unspecified multiple injuries, initial encounter: Secondary | ICD-10-CM | POA: Diagnosis not present

## 2020-12-07 DIAGNOSIS — E119 Type 2 diabetes mellitus without complications: Secondary | ICD-10-CM | POA: Diagnosis not present

## 2020-12-07 DIAGNOSIS — S72031A Displaced midcervical fracture of right femur, initial encounter for closed fracture: Secondary | ICD-10-CM | POA: Diagnosis not present

## 2020-12-07 DIAGNOSIS — M25551 Pain in right hip: Secondary | ICD-10-CM

## 2020-12-07 HISTORY — PX: HIP CLOSED REDUCTION: SHX983

## 2020-12-07 LAB — BASIC METABOLIC PANEL
Anion gap: 10 (ref 5–15)
BUN: 17 mg/dL (ref 8–23)
CO2: 27 mmol/L (ref 22–32)
Calcium: 9.1 mg/dL (ref 8.9–10.3)
Chloride: 103 mmol/L (ref 98–111)
Creatinine, Ser: 0.7 mg/dL (ref 0.44–1.00)
GFR, Estimated: >60 mL/min (ref >60)
Glucose, Bld: 156 mg/dL — ABNORMAL HIGH (ref 70–99)
Potassium: 3.4 mmol/L — ABNORMAL LOW (ref 3.5–5.1)
Sodium: 140 mmol/L (ref 135–145)

## 2020-12-07 LAB — CBC WITH DIFFERENTIAL/PLATELET
Abs Immature Granulocytes: 0.04 10*3/uL (ref 0.00–0.07)
Basophils Absolute: 0.1 10*3/uL (ref 0.0–0.1)
Basophils Relative: 1 %
Eosinophils Absolute: 0.2 10*3/uL (ref 0.0–0.5)
Eosinophils Relative: 3 %
HCT: 34.3 % — ABNORMAL LOW (ref 36.0–46.0)
Hemoglobin: 11.1 g/dL — ABNORMAL LOW (ref 12.0–15.0)
Immature Granulocytes: 1 %
Lymphocytes Relative: 26 %
Lymphs Abs: 1.9 10*3/uL (ref 0.7–4.0)
MCH: 29.7 pg (ref 26.0–34.0)
MCHC: 32.4 g/dL (ref 30.0–36.0)
MCV: 91.7 fL (ref 80.0–100.0)
Monocytes Absolute: 0.8 10*3/uL (ref 0.1–1.0)
Monocytes Relative: 11 %
Neutro Abs: 4.4 10*3/uL (ref 1.7–7.7)
Neutrophils Relative %: 58 %
Platelets: 314 10*3/uL (ref 150–400)
RBC: 3.74 MIL/uL — ABNORMAL LOW (ref 3.87–5.11)
RDW: 13.8 % (ref 11.5–15.5)
WBC: 7.4 10*3/uL (ref 4.0–10.5)
nRBC: 0 % (ref 0.0–0.2)

## 2020-12-07 LAB — PROTIME-INR
INR: 1 (ref 0.8–1.2)
Prothrombin Time: 13.1 seconds (ref 11.4–15.2)

## 2020-12-07 LAB — TYPE AND SCREEN
ABO/RH(D): A NEG
Antibody Screen: NEGATIVE

## 2020-12-07 LAB — CBG MONITORING, ED: Glucose-Capillary: 176 mg/dL — ABNORMAL HIGH (ref 70–99)

## 2020-12-07 LAB — RESP PANEL BY RT-PCR (FLU A&B, COVID) ARPGX2
Influenza A by PCR: NEGATIVE
Influenza B by PCR: NEGATIVE
SARS Coronavirus 2 by RT PCR: NEGATIVE

## 2020-12-07 SURGERY — CLOSED REDUCTION, HIP
Anesthesia: General | Site: Hip | Laterality: Right

## 2020-12-07 MED ORDER — BUPIVACAINE-EPINEPHRINE (PF) 0.5% -1:200000 IJ SOLN
INTRAMUSCULAR | Status: AC
  Filled 2020-12-07: qty 30

## 2020-12-07 MED ORDER — FENTANYL CITRATE (PF) 100 MCG/2ML IJ SOLN
INTRAMUSCULAR | Status: AC
  Filled 2020-12-07: qty 2

## 2020-12-07 MED ORDER — POTASSIUM CHLORIDE IN NACL 20-0.9 MEQ/L-% IV SOLN
INTRAVENOUS | Status: DC
  Filled 2020-12-07 (×5): qty 1000

## 2020-12-07 MED ORDER — FENTANYL CITRATE (PF) 100 MCG/2ML IJ SOLN
INTRAMUSCULAR | Status: DC | PRN
  Administered 2020-12-07 (×2): 50 ug via INTRAVENOUS

## 2020-12-07 MED ORDER — SODIUM CHLORIDE (PF) 0.9 % IJ SOLN
INTRAMUSCULAR | Status: AC
  Filled 2020-12-07: qty 50

## 2020-12-07 MED ORDER — ONDANSETRON HCL 4 MG PO TABS: 4.0000 mg | ORAL_TABLET | Freq: Four times a day (QID) | ORAL | Status: DC | PRN

## 2020-12-07 MED ORDER — METOCLOPRAMIDE HCL 5 MG/ML IJ SOLN: 5.0000 mg | Freq: Three times a day (TID) | INTRAMUSCULAR | Status: DC | PRN

## 2020-12-07 MED ORDER — FENTANYL CITRATE (PF) 100 MCG/2ML IJ SOLN: 25.0000 ug | INTRAMUSCULAR | Status: DC | PRN

## 2020-12-07 MED ORDER — SUCCINYLCHOLINE CHLORIDE 200 MG/10ML IV SOSY
PREFILLED_SYRINGE | INTRAVENOUS | Status: AC
  Filled 2020-12-07: qty 10

## 2020-12-07 MED ORDER — ONDANSETRON HCL 4 MG/2ML IJ SOLN: 4.0000 mg | Freq: Once | INTRAMUSCULAR | Status: DC | PRN

## 2020-12-07 MED ORDER — ROCURONIUM BROMIDE 10 MG/ML (PF) SYRINGE
PREFILLED_SYRINGE | INTRAVENOUS | Status: AC
  Filled 2020-12-07: qty 10

## 2020-12-07 MED ORDER — METOCLOPRAMIDE HCL 10 MG PO TABS
5.0000 mg | ORAL_TABLET | Freq: Three times a day (TID) | ORAL | Status: DC | PRN
Start: 2020-12-07 — End: 2020-12-08

## 2020-12-07 MED ORDER — CEFAZOLIN SODIUM-DEXTROSE 2-4 GM/100ML-% IV SOLN
INTRAVENOUS | Status: AC
  Filled 2020-12-07: qty 100

## 2020-12-07 MED ORDER — PROPOFOL 10 MG/ML IV BOLUS
INTRAVENOUS | Status: AC
  Filled 2020-12-07: qty 20

## 2020-12-07 MED ORDER — PROPOFOL 10 MG/ML IV BOLUS
INTRAVENOUS | Status: DC | PRN
  Administered 2020-12-07: 50 mg via INTRAVENOUS

## 2020-12-07 MED ORDER — ONDANSETRON HCL 4 MG/2ML IJ SOLN: 4.0000 mg | Freq: Four times a day (QID) | INTRAMUSCULAR | Status: DC | PRN

## 2020-12-07 MED ORDER — TRANEXAMIC ACID 1000 MG/10ML IV SOLN
INTRAVENOUS | Status: AC
  Filled 2020-12-07: qty 10

## 2020-12-07 MED ORDER — LIDOCAINE HCL (CARDIAC) PF 100 MG/5ML IV SOSY
PREFILLED_SYRINGE | INTRAVENOUS | Status: DC | PRN
  Administered 2020-12-07: 50 mg via INTRAVENOUS

## 2020-12-07 MED ORDER — TRAMADOL HCL 50 MG PO TABS: 50.0000 mg | ORAL_TABLET | Freq: Four times a day (QID) | ORAL | Status: DC | PRN

## 2020-12-07 MED ORDER — FENTANYL CITRATE (PF) 100 MCG/2ML IJ SOLN: 50.0000 ug | Freq: Once | INTRAMUSCULAR | Status: DC

## 2020-12-07 MED ORDER — LACTATED RINGERS IV SOLN: INTRAVENOUS | Status: DC | PRN

## 2020-12-07 MED ORDER — LIDOCAINE HCL (PF) 2 % IJ SOLN
INTRAMUSCULAR | Status: AC
  Filled 2020-12-07: qty 5

## 2020-12-07 MED ORDER — ACETAMINOPHEN 10 MG/ML IV SOLN: 1000.0000 mg | Freq: Once | INTRAVENOUS | Status: DC | PRN

## 2020-12-07 MED ORDER — BUPIVACAINE LIPOSOME 1.3 % IJ SUSP
INTRAMUSCULAR | Status: AC
  Filled 2020-12-07: qty 20

## 2020-12-07 MED ORDER — CEFAZOLIN SODIUM-DEXTROSE 2-3 GM-%(50ML) IV SOLR
INTRAVENOUS | Status: DC | PRN
  Administered 2020-12-07: 2 g via INTRAVENOUS

## 2020-12-07 MED ORDER — SUCCINYLCHOLINE CHLORIDE 20 MG/ML IJ SOLN
INTRAMUSCULAR | Status: DC | PRN
  Administered 2020-12-07: 30 mg via INTRAVENOUS

## 2020-12-07 MED ORDER — FENTANYL CITRATE (PF) 100 MCG/2ML IJ SOLN
50.0000 ug | Freq: Once | INTRAMUSCULAR | Status: AC
Start: 2020-12-07 — End: 2020-12-07
  Administered 2020-12-07: 50 ug via INTRAVENOUS
  Filled 2020-12-07: qty 2

## 2020-12-07 SURGICAL SUPPLY — 67 items
APL PRP STRL LF DISP 70% ISPRP (MISCELLANEOUS)
BAG DECANTER FOR FLEXI CONT (MISCELLANEOUS) IMPLANT
BLADE SAGITTAL WIDE XTHICK NO (BLADE) ×1 IMPLANT
BLADE SURG SZ20 CARB STEEL (BLADE) ×1 IMPLANT
BNDG COHESIVE 6X5 TAN STRL LF (GAUZE/BANDAGES/DRESSINGS) ×1 IMPLANT
BOWL CEMENT MIXING ADV NOZZLE (MISCELLANEOUS) IMPLANT
CANISTER SUCT 1200ML W/VALVE (MISCELLANEOUS) ×1 IMPLANT
CANISTER SUCT 3000ML PPV (MISCELLANEOUS) ×2 IMPLANT
CHLORAPREP W/TINT 26 (MISCELLANEOUS) ×2 IMPLANT
COVER WAND RF STERILE (DRAPES) ×1 IMPLANT
DECANTER SPIKE VIAL GLASS SM (MISCELLANEOUS) ×2 IMPLANT
DRAPE 3/4 80X56 (DRAPES) ×1 IMPLANT
DRAPE C-ARM XRAY 36X54 (DRAPES) IMPLANT
DRAPE INCISE IOBAN 66X60 STRL (DRAPES) ×1 IMPLANT
DRAPE ORTHO SPLIT 77X108 STRL (DRAPES)
DRAPE SURG 17X11 SM STRL (DRAPES) ×1 IMPLANT
DRAPE SURG 17X23 STRL (DRAPES) ×1 IMPLANT
DRAPE SURG ORHT 6 SPLT 77X108 (DRAPES) ×2 IMPLANT
DRSG OPSITE POSTOP 4X12 (GAUZE/BANDAGES/DRESSINGS) ×1 IMPLANT
DRSG OPSITE POSTOP 4X14 (GAUZE/BANDAGES/DRESSINGS) IMPLANT
DRSG OPSITE POSTOP 4X8 (GAUZE/BANDAGES/DRESSINGS) ×1 IMPLANT
ELECT BLADE 6.5 EXT (BLADE) ×1 IMPLANT
ELECT CAUTERY BLADE 6.4 (BLADE) ×1 IMPLANT
ELECT REM PT RETURN 9FT ADLT (ELECTROSURGICAL)
ELECTRODE REM PT RTRN 9FT ADLT (ELECTROSURGICAL) ×1 IMPLANT
GAUZE PACK 2X3YD (PACKING) IMPLANT
GLOVE INDICATOR 8.0 STRL GRN (GLOVE) ×1 IMPLANT
GLOVE SRG 8 PF TXTR STRL LF DI (GLOVE) IMPLANT
GLOVE SURG ENC MOIS LTX SZ8 (GLOVE) ×3 IMPLANT
GLOVE SURG ENC TEXT LTX SZ7.5 (GLOVE) IMPLANT
GLOVE SURG UNDER POLY LF SZ8 (GLOVE)
GOWN STRL REUS W/ TWL LRG LVL3 (GOWN DISPOSABLE) ×1 IMPLANT
GOWN STRL REUS W/ TWL XL LVL3 (GOWN DISPOSABLE) ×1 IMPLANT
GOWN STRL REUS W/TWL LRG LVL3 (GOWN DISPOSABLE)
GOWN STRL REUS W/TWL XL LVL3 (GOWN DISPOSABLE)
HOOD PEEL AWAY FLYTE STAYCOOL (MISCELLANEOUS) ×2 IMPLANT
IV NS 100ML SINGLE PACK (IV SOLUTION) IMPLANT
LABEL OR SOLS (LABEL) ×1 IMPLANT
MANIFOLD NEPTUNE II (INSTRUMENTS) ×1 IMPLANT
NDL FILTER BLUNT 18X1 1/2 (NEEDLE) ×1 IMPLANT
NDL SAFETY ECLIPSE 18X1.5 (NEEDLE) ×1 IMPLANT
NDL SPNL 20GX3.5 QUINCKE YW (NEEDLE) ×1 IMPLANT
NEEDLE FILTER BLUNT 18X 1/2SAF (NEEDLE)
NEEDLE FILTER BLUNT 18X1 1/2 (NEEDLE) IMPLANT
NEEDLE HYPO 18GX1.5 SHARP (NEEDLE)
NEEDLE SPNL 20GX3.5 QUINCKE YW (NEEDLE) IMPLANT
NS IRRIG 1000ML POUR BTL (IV SOLUTION) ×1 IMPLANT
PACK HIP PROSTHESIS (MISCELLANEOUS) ×1 IMPLANT
PULSAVAC PLUS IRRIG FAN TIP (DISPOSABLE)
SOL .9 NS 3000ML IRR  AL (IV SOLUTION)
SOL .9 NS 3000ML IRR AL (IV SOLUTION)
SOL .9 NS 3000ML IRR UROMATIC (IV SOLUTION) ×2 IMPLANT
STAPLER SKIN PROX 35W (STAPLE) ×1 IMPLANT
STRAP SAFETY 5IN WIDE (MISCELLANEOUS) ×1 IMPLANT
SUT ETHIBOND 2 V 37 (SUTURE) ×1 IMPLANT
SUT VIC AB 1 CT1 36 (SUTURE) IMPLANT
SUT VIC AB 2-0 CT1 (SUTURE) ×2 IMPLANT
SUT VIC AB 2-0 CT1 27 (SUTURE)
SUT VIC AB 2-0 CT1 TAPERPNT 27 (SUTURE) IMPLANT
SUT VICRYL 1-0 27IN ABS (SUTURE)
SUTURE VICRYL 1-0 27IN ABS (SUTURE) ×2 IMPLANT
SYR 10ML LL (SYRINGE) ×1 IMPLANT
SYR 30ML LL (SYRINGE) ×3 IMPLANT
SYR TB 1ML 27GX1/2 LL (SYRINGE) IMPLANT
TAPE TRANSPORE STRL 2 31045 (GAUZE/BANDAGES/DRESSINGS) ×1 IMPLANT
TIP BRUSH PULSAVAC PLUS 24.33 (MISCELLANEOUS) IMPLANT
TIP FAN IRRIG PULSAVAC PLUS (DISPOSABLE) ×1 IMPLANT

## 2020-12-07 NOTE — Anesthesia Procedure Notes (Signed)
Procedure Name: Intubation Date/Time: 12/07/2020 4:31 PM Performed by: Jerrye Noble, CRNA Pre-anesthesia Checklist: Patient identified, Emergency Drugs available, Suction available and Patient being monitored Patient Re-evaluated:Patient Re-evaluated prior to induction Oxygen Delivery Method: Circle system utilized Preoxygenation: Pre-oxygenation with 100% oxygen Induction Type: IV induction Ventilation: Mask ventilation without difficulty Laryngoscope Size: McGraph and 3 Grade View: Grade I Tube type: Oral Tube size: 6.5 mm Number of attempts: 1 Airway Equipment and Method: Stylet,  Oral airway and Video-laryngoscopy Placement Confirmation: ETT inserted through vocal cords under direct vision,  positive ETCO2 and breath sounds checked- equal and bilateral Secured at: 20 cm Tube secured with: Tape Dental Injury: Teeth and Oropharynx as per pre-operative assessment

## 2020-12-07 NOTE — ED Triage Notes (Signed)
Patient presents to theED via EMS from [place]. Per staff, patient had hip surgery this year, and is supposed to wear an immobilizer to stabilize the right leg. Staff report the patient is confused, and often takes the immobilizer off. Staff report noticing the patient's right hip was internally rotated today after the patient reported increased pain. The patient is confused, but that is baseline per the facility staff. Staff report she is nonverbal and tearful most of the time. The patient is oriented to person only on arrival. The patient's right LE noted to be shortened significantly and internally rotated. LLE WNL.

## 2020-12-07 NOTE — H&P (Addendum)
Subjective:  Chief complaint: Right hip pain and deformity.  The patient is a 86 y.o. female with multiple medical problems, including dementia, coronary artery disease, status post prior heart attack, hypertension, adult onset diabetes, hypercholesterolemia, depression, and prior TIA who is now 4 weeks status post a cemented right hip hemiarthroplasty for a right femoral neck fracture.  The patient was discharged from the hospital to rehab where she has been since her hospitalization. The patient was noted to have a deformity of her right hip and lower extremity this morning while in rehab, so she was brought to the emergency room. X-rays in the emergency room demonstrated a posterior superior dislocation of the right hip hemiarthroplasty. There is no history of any witnessed or unwitnessed falls. The patient is a poor historian and cannot provide any history herself.  Patient Active Problem List   Diagnosis Date Noted  . Closed right hip fracture ([place]) 11/10/2020  . Vascular dementia without behavioral disturbance ([place]) 09/12/2020  . Depression 09/12/2020  . Acute ischemic stroke ([place]) - R brain stroke not seen on imaging 08/19/2020  . TIA (transient ischemic attack) 08/13/2020  . Mild cognitive impairment 03/09/2018  . Pruritus 03/09/2018  . CAD S/P percutaneous coronary angioplasty 04/27/2017  . Dementia ([place]) 04/27/2017  . Dyslipidemia 04/07/2017  . Non-insulin treated type 2 diabetes mellitus ([place]) 04/06/2017  . Essential hypertension 04/06/2017  . History of non-ST elevation myocardial infarction (NSTEMI) 04/03/2017   Past Medical History:  Diagnosis Date  . Anxiety   . Arthritis    oa  . Coronary artery disease    04/06/17 PCI with DES to [facility], 90% in diag from LAD, nonobstructive disease in LAD, RCA. Normal EF.   [name] Dementia ([place])   . Depression   . Diabetes mellitus without complication ([place])   . Hypertension   . NSTEMI (non-ST elevated myocardial infarction) ([place])   .  Ringing in ears   . TIA (transient ischemic attack)     Past Surgical History:  Procedure Laterality Date  . ABDOMINAL HYSTERECTOMY    . CORONARY STENT INTERVENTION  04/06/2017  . CORONARY STENT INTERVENTION N/A 04/06/2017   Procedure: Coronary Stent Intervention;  Surgeon: Sherren Mocha, MD;  Location: [place] [facility];  Service: Cardiovascular;  Laterality: N/A;  . HIP ARTHROPLASTY Right 11/11/2020   Procedure: ARTHROPLASTY BIPOLAR HIP (HEMIARTHROPLASTY);  Surgeon: Leim Fabry, MD;  Location: [facility] ORS;  Service: Orthopedics;  Laterality: Right;  . LEFT HEART CATH AND CORONARY ANGIOGRAPHY N/A 04/06/2017   Procedure: Left Heart Cath and Coronary Angiography;  Surgeon: Sherren Mocha, MD;  Location: [place] [facility];  Service: Cardiovascular;  Laterality: N/A;    Medications Prior to Admission  Medication Sig Dispense Refill Last Dose  . aspirin EC 81 MG EC tablet Take 1 tablet (81 mg total) by mouth daily. 30 tablet  12/07/2020 at 0800  . carvedilol (COREG) 6.25 MG tablet Take 6.25 mg by mouth 2 (two) times daily.   12/07/2020 at 0800  . clopidogrel (PLAVIX) 75 MG tablet Take 75 mg by mouth daily.   12/07/2020 at 0800  . divalproex (DEPAKOTE) 250 MG DR tablet Take 1 tablet (250 mg total) by mouth 2 (two) times daily. 60 tablet 11 12/07/2020 at 0800  . LANTUS SOLOSTAR 100 UNIT/ML Solostar Pen Inject 14 Units into the skin at bedtime.   12/06/2020 at 2000  . sertraline (ZOLOFT) 25 MG tablet Take 12.5-25 mg by mouth daily.   12/07/2020 at 0800  . escitalopram (LEXAPRO) 10 MG tablet  Take 10 mg by mouth daily. (Patient not taking: No sig reported)   Not Taking at Unknown time  . nitroGLYCERIN (NITROSTAT) 0.4 MG SL tablet Place 1 tablet (0.4 mg total) under the tongue every 5 (five) minutes x 3 doses as needed for chest pain. (Patient taking differently: Place 0.4 mg under the tongue every 5 (five) minutes as needed for chest pain (max 3 doses).) 25 tablet 2 unknown at prn  . traMADol (ULTRAM) 50 MG  tablet Take 1 tablet (50 mg total) by mouth every 6 (six) hours. 30 tablet 0 unknown at prn  . TRULICITY 1.5 UU/7.2ZD SOPN Inject 1.5 mg into the skin once a week. Monday   12/03/2020 at 0800   Allergies  Allergen Reactions  . Penicillins Hives  . Sulfa Antibiotics Rash  . Vicodin [Hydrocodone-Acetaminophen] Nausea And Vomiting  . Codeine Rash    Social History   Tobacco Use  . Smoking status: Former Research scientist (life sciences)  . Smokeless tobacco: Never Used  . Tobacco comment: quit "20 years ago"  Substance Use Topics  . Alcohol use: No    Family History  Problem Relation Age of Onset  . CAD Father   . Cancer Sister   . Hearing loss Mother   . Hypertension Mother      Review of Systems: As noted above. The patient denies any chest pain, shortness of breath, nausea, vomiting, diarrhea, constipation, belly pain, blood in his/her stool, or burning with urination.  Objective: Temp:  [97.7 F (36.5 C)] 97.7 F (36.5 C) (03/11 1328) Pulse Rate:  [89-102] 102 (03/11 1622) Resp:  [13-22] 22 (03/11 1622) BP: (131-164)/(94-102) 164/102 (03/11 1622) SpO2:  [97 %-100 %] 100 % (03/11 1622) Weight:  [55 kg] 55 kg (03/11 1329)  Physical Exam: General:  Alert, no acute distress Psychiatric:  Patient is not competent for consent, but exhibits normal mood and affect Cardiovascular:  RRR  Respiratory:  Clear to auscultation. No wheezing. Non-labored breathing GI:  Abdomen is soft and non-tender Skin:  No lesions in the area of chief complaint Neurologic:  Sensation intact distally Lymphatic:  No axillary or cervical lymphadenopathy  Orthopedic Exam:  Orthopedic examination is limited to the right hip and lower extremity. The right lower extremity is obviously shortened and internally rotated as compared to the left. There is a fullness in the posterior buttock region of the right hip. Her surgical incision is well-healed and without evidence for infection. Perhaps mild swelling is noted in the hip region,  but no erythema, ecchymosis, abrasions, or other skin abnormalities are identified. She is grossly neurovascularly intact to the right lower extremity and foot.  Imaging Review: Recent x-rays of the pelvis and left hip are available for review. These films demonstrate a postero-superior dislocation of a previously placed cemented right hip hemiarthroplasty. The prosthesis appears to be in good position and without evidence of loosening. No periacetabular fractures are identified. No lytic lesions or other acute bony abnormalities are noted.  Assessment: Closed postero-superior dislocation of right hip hemiarthroplasty.  Plan: The treatment options, including both surgical and nonsurgical choices, have been discussed in detail with the patient and her family. The risks (including bleeding, infection, nerve and/or blood vessel injury, persistent or recurrent pain, loosening or failure of the components, leg length inequality, recurrent dislocation, need for further surgery, blood clots, strokes, heart attacks or arrhythmias, pneumonia, etc.) and benefits of the surgical procedure were discussed. The patient's daughter states her understanding on her mother's behalf and agrees to proceed. She  agrees to a blood transfusion if necessary. A formal written consent has been obtained.

## 2020-12-07 NOTE — Op Note (Signed)
12/07/2020  5:05 PM  Patient:   Shawna Lynch  Pre-Op Diagnosis:   Closed posterior right prothesic hip dislocation.  Post-Op Diagnosis:   Same.  Procedure:   Closed reduction of right prosthetic hip dislocation.  Surgeon:   Pascal Lux, MD  Assistant:   None  Anesthesia:   GET  Findings:   As above.  Complications:   None  EBL:   None  Fluids:   100 cc crystalloid  TT:   None  Drains:   None  Closure:   None  Implants:   None  Brief Clinical Note:   The patient is a 86 year old female who is now nearly 4 weeks status post a cemented right hip hemiarthroplasty for a displaced right femoral neck fracture. This morning, the patient was noted to have a deformity of her hip. She was brought to the emergency room where x-rays demonstrated a posterior dislocation of the right hip. The patient is brought to the operating room at this time for closed reduction under general anesthesia of this dislocation.  Procedure:   The patient was brought into the operating room and lain in the supine position. After adequate general endotracheal intubation and anesthesia were obtained, a timeout was performed to verify the appropriate surgical site. The hip was then reduced using longitudinal traction with internal rotation and extension while countertraction was applied to the pelvis. The hip was felt to clunk back into place. The leg lengths were now equal and leg rotation was symmetric to the contralateral side. The hip was stable to flexion to 90 with internal rotation to 10. The adequacy of reduction was confirmed by fluoroscopic imaging before the patient's right lower extremity was placed into a knee immobilizer. The patient was then awakened, extubated, and returned to the recovery room in satisfactory condition after tolerating the procedure well.

## 2020-12-07 NOTE — Discharge Instructions (Addendum)
Orthopedic discharge instructions: May loosen the knee immobilizer for hygienic purposes and sponge bathing.  Apply ice frequently to hip. Take ES Tylenol or tramadol when needed for pain.  May weight-bear as tolerated in the knee immobilizer- use crutches or walker for balance and support. Follow-up in 10-14 days or as scheduled with Dr. Posey Pronto.   AMBULATORY SURGERY  DISCHARGE INSTRUCTIONS  1) The drugs that you were given will stay in your system until tomorrow so for the next 24 hours you should not: A) Drive an automobile B) Make any legal decisions C) Drink any alcoholic beverage  2) You may resume regular meals tomorrow.  Today it is better to start with liquids and gradually work up to solid foods. You may eat anything you prefer, but it is better to start with liquids, then soup and crackers, and gradually work up to solid foods.  3) Please notify your doctor immediately if you have any unusual bleeding, trouble breathing, redness and pain at the surgery site, drainage, fever, or pain not relieved by medication.  4) Additional Instructions:  Please contact your physician with any problems or Same Day Surgery at [phone], Monday through Friday 6 am to 4 pm, or [place] at [facility] number at [phone].

## 2020-12-07 NOTE — ED Notes (Signed)
Report given to OR.

## 2020-12-07 NOTE — ED Provider Notes (Addendum)
[facility] Emergency Department Provider Note  ____________________________________________   Event Date/Time   First MD Initiated Contact with Patient 12/07/20 1318     (approximate)  I have reviewed the triage vital signs and the nursing notes.   HISTORY  Chief Complaint Hip Pain   HPI Shawna Lynch is a 86 y.o. female with medical history significant forosteoarthritis,anxiety,CAD s/p PCI and stent, dementia, depression, HTN, TIA, and recent R femur fx 2/2 mechanical fall over dog s/p arthroplasty on 2/13 who presents via EMS from dementia nursing facility for further assessment of seemingly worsening right hip pain and possibly worsening deformity.  Patient is able to provide any history secondary to dementia other than endorsing pain in her right hip.  Per staff she is not on normally oriented at baseline I felt that her right hip seemed a little more painful and deformed than usual today.  She is bedbound and does not ambulate since coming to facility after her recent admission.  She has not had any witnessed falls or other sick symptoms including vomiting, cough, fevers, diarrhea or any other acute complaints per staff.  No other history is available on patient presentation   Past Medical History:  Diagnosis Date  . Anxiety   . Arthritis    oa  . Coronary artery disease    04/06/17 PCI with DES to [facility], 90% in diag from LAD, nonobstructive disease in LAD, RCA. Normal EF.   [name] Dementia ([place])   . Depression   . Diabetes mellitus without complication ([place])   . Hypertension   . NSTEMI (non-ST elevated myocardial infarction) ([place])   . Ringing in ears   . TIA (transient ischemic attack)     Patient Active Problem List   Diagnosis Date Noted  . Closed right hip fracture ([place]) 11/10/2020  . Vascular dementia without behavioral disturbance ([place]) 09/12/2020  . Depression 09/12/2020  . Acute ischemic stroke ([place]) - R brain stroke not seen on imaging  08/19/2020  . TIA (transient ischemic attack) 08/13/2020  . Mild cognitive impairment 03/09/2018  . Pruritus 03/09/2018  . CAD S/P percutaneous coronary angioplasty 04/27/2017  . Dementia ([place]) 04/27/2017  . Dyslipidemia 04/07/2017  . Non-insulin treated type 2 diabetes mellitus ([place]) 04/06/2017  . Essential hypertension 04/06/2017  . History of non-ST elevation myocardial infarction (NSTEMI) 04/03/2017    Past Surgical History:  Procedure Laterality Date  . ABDOMINAL HYSTERECTOMY    . CORONARY STENT INTERVENTION  04/06/2017  . CORONARY STENT INTERVENTION N/A 04/06/2017   Procedure: Coronary Stent Intervention;  Surgeon: Sherren Mocha, MD;  Location: [place] [facility];  Service: Cardiovascular;  Laterality: N/A;  . HIP ARTHROPLASTY Right 11/11/2020   Procedure: ARTHROPLASTY BIPOLAR HIP (HEMIARTHROPLASTY);  Surgeon: Leim Fabry, MD;  Location: [facility] ORS;  Service: Orthopedics;  Laterality: Right;  . LEFT HEART CATH AND CORONARY ANGIOGRAPHY N/A 04/06/2017   Procedure: Left Heart Cath and Coronary Angiography;  Surgeon: Sherren Mocha, MD;  Location: [place] [facility];  Service: Cardiovascular;  Laterality: N/A;    Prior to Admission medications   Medication Sig Start Date End Date Taking? Authorizing Provider  aspirin EC 81 MG EC tablet Take 1 tablet (81 mg total) by mouth daily. 04/07/17  Yes [name], NP  carvedilol (COREG) 6.25 MG tablet Take 6.25 mg by mouth 2 (two) times daily. 10/18/20  Yes [provider]  clopidogrel (PLAVIX) 75 MG tablet Take 75 mg by mouth daily. 09/26/20  Yes [provider]  divalproex (  DEPAKOTE) 250 MG DR tablet Take 1 tablet (250 mg total) by mouth 2 (two) times daily. 09/12/20  Yes Marcial Pacas, MD  LANTUS SOLOSTAR 100 UNIT/ML Solostar Pen Inject 14 Units into the skin at bedtime. 06/17/20  Yes [provider]  sertraline (ZOLOFT) 25 MG tablet Take 12.5-25 mg by mouth daily. 11/28/20  Yes [provider]   escitalopram (LEXAPRO) 10 MG tablet Take 10 mg by mouth daily. Patient not taking: No sig reported 06/25/20   [provider]  nitroGLYCERIN (NITROSTAT) 0.4 MG SL tablet Place 1 tablet (0.4 mg total) under the tongue every 5 (five) minutes x 3 doses as needed for chest pain. Patient taking differently: Place 0.4 mg under the tongue every 5 (five) minutes as needed for chest pain (max 3 doses). 04/07/17   [name], NP  traMADol (ULTRAM) 50 MG tablet Take 1 tablet (50 mg total) by mouth every 6 (six) hours. 11/12/20   Reche Dixon, PA-C  TRULICITY 1.5 TD/1.7OH SOPN Inject 1.5 mg into the skin once a week. Monday 10/18/20   [provider]    Allergies Penicillins, Sulfa antibiotics, Vicodin [hydrocodone-acetaminophen], and Codeine  Family History  Problem Relation Age of Onset  . CAD Father   . Cancer Sister   . Hearing loss Mother   . Hypertension Mother     Social History Social History   Tobacco Use  . Smoking status: Former Research scientist (life sciences)  . Smokeless tobacco: Never Used  . Tobacco comment: quit "20 years ago"  Vaping Use  . Vaping Use: Never used  Substance Use Topics  . Alcohol use: No  . Drug use: No    Review of Systems  Review of Systems  Unable to perform ROS: Dementia  Musculoskeletal: Positive for joint pain ( R hip).      ____________________________________________   PHYSICAL EXAM:  VITAL SIGNS: ED Triage Vitals  Enc Vitals Group     BP      Pulse      Resp      Temp      Temp src      SpO2      Weight      Height      Head Circumference      Peak Flow      Pain Score      Pain Loc      Pain Edu?      Excl. in [place]?    Vitals:   12/07/20 1730 12/07/20 1745  BP: (!) 146/89 134/89  Pulse: (!) 108 (!) 104  Resp: 13 15  Temp:    SpO2: 98% 96%   Physical Exam Vitals and nursing note reviewed.  Constitutional:      General: She is in acute distress.     Appearance: She is well-developed.  HENT:     Head: Normocephalic  and atraumatic.     Right Ear: External ear normal.     Left Ear: External ear normal.     Nose: Nose normal.  Eyes:     Conjunctiva/sclera: Conjunctivae normal.  Cardiovascular:     Rate and Rhythm: Normal rate and regular rhythm.     Heart sounds: No murmur heard.   Pulmonary:     Effort: Pulmonary effort is normal. No respiratory distress.     Breath sounds: Normal breath sounds.  Abdominal:     Palpations: Abdomen is soft.     Tenderness: There is no abdominal tenderness.  Musculoskeletal:  Cervical back: Neck supple.     Right hip: Decreased range of motion. Decreased strength.     Comments: RLE internally rotated and shortened  Skin:    General: Skin is warm and dry.     Capillary Refill: Capillary refill takes less than 2 seconds.  Neurological:     Mental Status: She is alert. Mental status is at baseline. She is disoriented and confused.  Psychiatric:        Mood and Affect: Mood normal.     2+ bilateral DP pulses.  Patient is able to wiggle her toes on her right foot on command. ____________________________________________   LABS (all labs ordered are listed, but only abnormal results are displayed)  Labs Reviewed  CBC WITH DIFFERENTIAL/PLATELET - Abnormal; Notable for the following components:      Result Value   RBC 3.74 (*)    Hemoglobin 11.1 (*)    HCT 34.3 (*)    All other components within normal limits  BASIC METABOLIC PANEL - Abnormal; Notable for the following components:   Potassium 3.4 (*)    Glucose, Bld 156 (*)    All other components within normal limits  CBG MONITORING, ED - Abnormal; Notable for the following components:   Glucose-Capillary 176 (*)    All other components within normal limits  RESP PANEL BY RT-PCR (FLU A&B, COVID) ARPGX2  PROTIME-INR  TYPE AND SCREEN   ____________________________________________  EKG  Sinus tachycardia with a ventricular rate of 100, PVCs, unremarkable intervals and no clear evidence of acute  ischemia or other significant underlying arrhythmia.  ____________________________________________  RADIOLOGY  ED MD interpretation: Right femoral prosthesis dislocated superior and posteriorly.  No clear fracture.  Official radiology report(s): DG HIP OPERATIVE UNILAT W OR W/O PELVIS RIGHT  Result Date: 12/07/2020 CLINICAL DATA:  Reduction of right hip dislocation. EXAM: OPERATIVE right HIP (WITH PELVIS IF PERFORMED) 2 VIEWS TECHNIQUE: Fluoroscopic spot image(s) were submitted for interpretation post-operatively. COMPARISON:  Earlier film, same date. FINDINGS: Interval reduction of the right femoral prosthesis dislocation. No fracture is identified. IMPRESSION: Interval reduction of the right femoral prosthesis dislocation. Electronically Signed   By: Marijo Sanes M.D.   On: 12/07/2020 17:09   DG HIP UNILAT WITH PELVIS 2-3 VIEWS RIGHT  Result Date: 12/07/2020 CLINICAL DATA:  Right hip pain.  History of recent hip surgery. EXAM: DG HIP (WITH OR WITHOUT PELVIS) 2-3V RIGHT COMPARISON:  Radiographs 11/10/2020 FINDINGS: The right femoral prosthesis is dislocated superiorly and posteriorly. No definite fractures are identified. The pubic symphysis and SI joints are intact. The left hip is normally located. No left hip fracture. IMPRESSION: Dislocated right femoral prosthesis. Electronically Signed   By: Marijo Sanes M.D.   On: 12/07/2020 14:49    ____________________________________________   PROCEDURES  Procedure(s) performed (including Critical Care):  .1-3 Lead EKG Interpretation Performed by: Lucrezia Starch, MD Authorized by: Lucrezia Starch, MD     Interpretation: normal     ECG rate assessment: normal     Rhythm: sinus rhythm     Ectopy: none     Conduction: normal       ____________________________________________   INITIAL IMPRESSION / ASSESSMENT AND PLAN / ED COURSE      Patient presents with above to history exam via EMS from nursing facility after she was  complaining of worsening pain in her right hip and was noted to have new deformity this morning.  Patient had no recent falls or injuries that staff witnessed and they are  not sure when the deformity is noticed.  She otherwise has not had any sick symptoms per staff.  On arrival she is slightly hypertensive otherwise stable vital signs on room air.  Her right hip is internally rotated and deformed she is neurovascular intact distally.  CBC unremarkable.  BMP shows a K of 3.4 no other significant electrolyte or metabolic derangements.  Screening Covid and iron are unremarkable.  Discussed patient with on-call orthopedist Dr. Benson Setting for reduction in the OR.  Transferred in stable condition.     ____________________________________________   FINAL CLINICAL IMPRESSION(S) / ED DIAGNOSES  Final diagnoses:  Posterior pain of right hip  Posterior dislocation of right hip, initial encounter (HCC)    Medications  fentaNYL (SUBLIMAZE) injection 50 mcg (has no administration in time range)  fentaNYL (SUBLIMAZE) 100 MCG/2ML injection (has no administration in time range)  ceFAZolin (ANCEF) 2-4 GM/100ML-% IVPB (has no administration in time range)  fentaNYL (SUBLIMAZE) injection 50 mcg (50 mcg Intravenous Given 12/07/20 1334)     ED Discharge Orders    None       Note:  This document was prepared using Dragon voice recognition software and may include unintentional dictation errors.   Lucrezia Starch, MD 12/07/20 1552    Lucrezia Starch, MD 12/07/20 [phone]

## 2020-12-07 NOTE — Anesthesia Preprocedure Evaluation (Signed)
Anesthesia Evaluation  Patient identified by MRN, date of birth, ID band Patient confused    Reviewed: Allergy & Precautions, NPO status , Patient's Chart, lab work & pertinent test results, Unable to perform ROS - Chart review only  History of Anesthesia Complications Negative for: history of anesthetic complications  Airway Mallampati: III       Dental  (+) Missing, Chipped, Poor Dentition   Pulmonary neg sleep apnea, neg COPD, Not current smoker, former smoker,           Cardiovascular hypertension, Pt. on medications + CAD, + Past MI and + Cardiac Stents  (-) CHF + dysrhythmias Atrial Fibrillation (-) Valvular Problems/Murmurs     Neuro/Psych PSYCHIATRIC DISORDERS Anxiety Depression Dementia TIACVA    GI/Hepatic Neg liver ROS, neg GERD  ,  Endo/Other  diabetes, Type 2, Oral Hypoglycemic Agents  Renal/GU negative Renal ROS     Musculoskeletal   Abdominal   Peds  Hematology   Anesthesia Other Findings Past Medical History: No date: Anxiety No date: Arthritis     Comment:  oa No date: Coronary artery disease     Comment:  04/06/17 PCI with DES to [facility], 90% in diag from LAD,               nonobstructive disease in LAD, RCA. Normal EF.  No date: Dementia ([place]) No date: Depression No date: Diabetes mellitus without complication (HCC) No date: Hypertension No date: NSTEMI (non-ST elevated myocardial infarction) ([place]) No date: Ringing in ears No date: TIA (transient ischemic attack)   Reproductive/Obstetrics                             Anesthesia Physical  Anesthesia Plan  ASA: III  Anesthesia Plan: General   Post-op Pain Management:    Induction: Intravenous  PONV Risk Score and Plan: 4 or greater and Ondansetron, Treatment may vary due to age or medical condition and Dexamethasone  Airway Management Planned: Oral ETT  Additional Equipment: None  Intra-op Plan:    Post-operative Plan: Extubation in OR  Informed Consent: I have reviewed the patients History and Physical, chart, labs and discussed the procedure including the risks, benefits and alternatives for the proposed anesthesia with the patient or authorized representative who has indicated his/her understanding and acceptance.   Patient has DNR.  Discussed DNR with power of attorney and Suspend DNR.   Consent reviewed with POA, Dental advisory given and History available from chart only  Plan Discussed with: CRNA and Surgeon  Anesthesia Plan Comments: (Discussed risks of anesthesia with POA daughter sharon bolton via phone, including PONV, sore throat, lip/dental damage. Rare risks discussed as well, such as cardiorespiratory and neurological sequelae. She understands. Also agrees to suspend DNR perioperatively.)        Anesthesia Quick Evaluation

## 2020-12-07 NOTE — Transfer of Care (Signed)
Immediate Anesthesia Transfer of Care Note  Patient: Shawna Lynch  Procedure(s) Performed: CLOSED REDUCTION HIP (Right Hip)  Patient Location: PACU  Anesthesia Type:General  Level of Consciousness: drowsy  Airway & Oxygen Therapy: Patient Spontanous Breathing and Patient connected to face mask oxygen  Post-op Assessment: Report given to RN and Post -op Vital signs reviewed and stable  Post vital signs: Reviewed and stable  Last Vitals:  Vitals Value Taken Time  BP 128/96 12/07/20 1654  Temp 36.4 C 12/07/20 1654  Pulse 109 12/07/20 1659  Resp 16 12/07/20 1659  SpO2 100 % 12/07/20 1659  Vitals shown include unvalidated device data.  Last Pain:  Vitals:   12/07/20 1654  TempSrc:   PainSc: Asleep         Complications: No complications documented.

## 2020-12-07 NOTE — Anesthesia Postprocedure Evaluation (Signed)
Anesthesia Post Note  Patient: Shawna Lynch  Procedure(s) Performed: CLOSED REDUCTION HIP (Right Hip)  Patient location during evaluation: PACU Anesthesia Type: General Level of consciousness: awake and alert Pain management: pain level controlled Vital Signs Assessment: post-procedure vital signs reviewed and stable Respiratory status: spontaneous breathing, nonlabored ventilation, respiratory function stable and patient connected to nasal cannula oxygen Cardiovascular status: blood pressure returned to baseline and stable Postop Assessment: no apparent nausea or vomiting Anesthetic complications: no   No complications documented.   Last Vitals:  Vitals:   12/07/20 1730 12/07/20 1745  BP: (!) 146/89 134/89  Pulse: (!) 108 (!) 104  Resp: 13 15  Temp:    SpO2: 98% 96%    Last Pain:  Vitals:   12/07/20 1745  TempSrc:   PainSc: 0-No pain                 Arita Miss

## 2020-12-08 ENCOUNTER — Encounter: Payer: Self-pay | Admitting: Surgery

## 2020-12-11 DIAGNOSIS — E1122 Type 2 diabetes mellitus with diabetic chronic kidney disease: Secondary | ICD-10-CM | POA: Diagnosis not present

## 2020-12-11 DIAGNOSIS — M810 Age-related osteoporosis without current pathological fracture: Secondary | ICD-10-CM | POA: Diagnosis not present

## 2020-12-11 DIAGNOSIS — I129 Hypertensive chronic kidney disease with stage 1 through stage 4 chronic kidney disease, or unspecified chronic kidney disease: Secondary | ICD-10-CM | POA: Diagnosis not present

## 2020-12-11 DIAGNOSIS — F329 Major depressive disorder, single episode, unspecified: Secondary | ICD-10-CM | POA: Diagnosis not present

## 2020-12-11 DIAGNOSIS — I2510 Atherosclerotic heart disease of native coronary artery without angina pectoris: Secondary | ICD-10-CM | POA: Diagnosis not present

## 2020-12-11 DIAGNOSIS — S72391D Other fracture of shaft of right femur, subsequent encounter for closed fracture with routine healing: Secondary | ICD-10-CM | POA: Diagnosis not present

## 2020-12-11 DIAGNOSIS — F0150 Vascular dementia without behavioral disturbance: Secondary | ICD-10-CM | POA: Diagnosis not present

## 2020-12-11 DIAGNOSIS — N1832 Chronic kidney disease, stage 3b: Secondary | ICD-10-CM | POA: Diagnosis not present

## 2020-12-11 DIAGNOSIS — S72001D Fracture of unspecified part of neck of right femur, subsequent encounter for closed fracture with routine healing: Secondary | ICD-10-CM | POA: Diagnosis not present

## 2020-12-12 DIAGNOSIS — F329 Major depressive disorder, single episode, unspecified: Secondary | ICD-10-CM | POA: Diagnosis not present

## 2020-12-12 DIAGNOSIS — I129 Hypertensive chronic kidney disease with stage 1 through stage 4 chronic kidney disease, or unspecified chronic kidney disease: Secondary | ICD-10-CM | POA: Diagnosis not present

## 2020-12-12 DIAGNOSIS — N1832 Chronic kidney disease, stage 3b: Secondary | ICD-10-CM | POA: Diagnosis not present

## 2020-12-12 DIAGNOSIS — E1122 Type 2 diabetes mellitus with diabetic chronic kidney disease: Secondary | ICD-10-CM | POA: Diagnosis not present

## 2020-12-12 DIAGNOSIS — S72391D Other fracture of shaft of right femur, subsequent encounter for closed fracture with routine healing: Secondary | ICD-10-CM | POA: Diagnosis not present

## 2020-12-12 DIAGNOSIS — S72001D Fracture of unspecified part of neck of right femur, subsequent encounter for closed fracture with routine healing: Secondary | ICD-10-CM | POA: Diagnosis not present

## 2020-12-12 DIAGNOSIS — F0150 Vascular dementia without behavioral disturbance: Secondary | ICD-10-CM | POA: Diagnosis not present

## 2020-12-12 DIAGNOSIS — M810 Age-related osteoporosis without current pathological fracture: Secondary | ICD-10-CM | POA: Diagnosis not present

## 2020-12-12 DIAGNOSIS — I2510 Atherosclerotic heart disease of native coronary artery without angina pectoris: Secondary | ICD-10-CM | POA: Diagnosis not present

## 2020-12-12 DIAGNOSIS — T84020A Dislocation of internal right hip prosthesis, initial encounter: Secondary | ICD-10-CM | POA: Diagnosis not present

## 2020-12-13 DIAGNOSIS — B351 Tinea unguium: Secondary | ICD-10-CM | POA: Diagnosis not present

## 2020-12-13 DIAGNOSIS — R2689 Other abnormalities of gait and mobility: Secondary | ICD-10-CM | POA: Diagnosis not present

## 2020-12-13 DIAGNOSIS — I2510 Atherosclerotic heart disease of native coronary artery without angina pectoris: Secondary | ICD-10-CM | POA: Diagnosis not present

## 2020-12-13 DIAGNOSIS — F0150 Vascular dementia without behavioral disturbance: Secondary | ICD-10-CM | POA: Diagnosis not present

## 2020-12-13 DIAGNOSIS — E118 Type 2 diabetes mellitus with unspecified complications: Secondary | ICD-10-CM | POA: Diagnosis not present

## 2020-12-13 DIAGNOSIS — S72391D Other fracture of shaft of right femur, subsequent encounter for closed fracture with routine healing: Secondary | ICD-10-CM | POA: Diagnosis not present

## 2020-12-13 DIAGNOSIS — E1122 Type 2 diabetes mellitus with diabetic chronic kidney disease: Secondary | ICD-10-CM | POA: Diagnosis not present

## 2020-12-13 DIAGNOSIS — S72001D Fracture of unspecified part of neck of right femur, subsequent encounter for closed fracture with routine healing: Secondary | ICD-10-CM | POA: Diagnosis not present

## 2020-12-13 DIAGNOSIS — N1832 Chronic kidney disease, stage 3b: Secondary | ICD-10-CM | POA: Diagnosis not present

## 2020-12-13 DIAGNOSIS — M79673 Pain in unspecified foot: Secondary | ICD-10-CM | POA: Diagnosis not present

## 2020-12-13 DIAGNOSIS — F329 Major depressive disorder, single episode, unspecified: Secondary | ICD-10-CM | POA: Diagnosis not present

## 2020-12-13 DIAGNOSIS — I129 Hypertensive chronic kidney disease with stage 1 through stage 4 chronic kidney disease, or unspecified chronic kidney disease: Secondary | ICD-10-CM | POA: Diagnosis not present

## 2020-12-13 DIAGNOSIS — M810 Age-related osteoporosis without current pathological fracture: Secondary | ICD-10-CM | POA: Diagnosis not present

## 2020-12-13 DIAGNOSIS — L603 Nail dystrophy: Secondary | ICD-10-CM | POA: Diagnosis not present

## 2020-12-18 DIAGNOSIS — I2510 Atherosclerotic heart disease of native coronary artery without angina pectoris: Secondary | ICD-10-CM | POA: Diagnosis not present

## 2020-12-18 DIAGNOSIS — S72001D Fracture of unspecified part of neck of right femur, subsequent encounter for closed fracture with routine healing: Secondary | ICD-10-CM | POA: Diagnosis not present

## 2020-12-18 DIAGNOSIS — F329 Major depressive disorder, single episode, unspecified: Secondary | ICD-10-CM | POA: Diagnosis not present

## 2020-12-18 DIAGNOSIS — F0150 Vascular dementia without behavioral disturbance: Secondary | ICD-10-CM | POA: Diagnosis not present

## 2020-12-18 DIAGNOSIS — E1122 Type 2 diabetes mellitus with diabetic chronic kidney disease: Secondary | ICD-10-CM | POA: Diagnosis not present

## 2020-12-18 DIAGNOSIS — N1832 Chronic kidney disease, stage 3b: Secondary | ICD-10-CM | POA: Diagnosis not present

## 2020-12-18 DIAGNOSIS — I129 Hypertensive chronic kidney disease with stage 1 through stage 4 chronic kidney disease, or unspecified chronic kidney disease: Secondary | ICD-10-CM | POA: Diagnosis not present

## 2020-12-18 DIAGNOSIS — M810 Age-related osteoporosis without current pathological fracture: Secondary | ICD-10-CM | POA: Diagnosis not present

## 2020-12-18 DIAGNOSIS — S72391D Other fracture of shaft of right femur, subsequent encounter for closed fracture with routine healing: Secondary | ICD-10-CM | POA: Diagnosis not present

## 2020-12-21 DIAGNOSIS — F0150 Vascular dementia without behavioral disturbance: Secondary | ICD-10-CM | POA: Diagnosis not present

## 2020-12-21 DIAGNOSIS — N1832 Chronic kidney disease, stage 3b: Secondary | ICD-10-CM | POA: Diagnosis not present

## 2020-12-21 DIAGNOSIS — S72001D Fracture of unspecified part of neck of right femur, subsequent encounter for closed fracture with routine healing: Secondary | ICD-10-CM | POA: Diagnosis not present

## 2020-12-21 DIAGNOSIS — E1122 Type 2 diabetes mellitus with diabetic chronic kidney disease: Secondary | ICD-10-CM | POA: Diagnosis not present

## 2020-12-21 DIAGNOSIS — M810 Age-related osteoporosis without current pathological fracture: Secondary | ICD-10-CM | POA: Diagnosis not present

## 2020-12-21 DIAGNOSIS — I129 Hypertensive chronic kidney disease with stage 1 through stage 4 chronic kidney disease, or unspecified chronic kidney disease: Secondary | ICD-10-CM | POA: Diagnosis not present

## 2020-12-21 DIAGNOSIS — S72391D Other fracture of shaft of right femur, subsequent encounter for closed fracture with routine healing: Secondary | ICD-10-CM | POA: Diagnosis not present

## 2020-12-21 DIAGNOSIS — F329 Major depressive disorder, single episode, unspecified: Secondary | ICD-10-CM | POA: Diagnosis not present

## 2020-12-21 DIAGNOSIS — I2510 Atherosclerotic heart disease of native coronary artery without angina pectoris: Secondary | ICD-10-CM | POA: Diagnosis not present

## 2020-12-25 DIAGNOSIS — M810 Age-related osteoporosis without current pathological fracture: Secondary | ICD-10-CM | POA: Diagnosis not present

## 2020-12-25 DIAGNOSIS — S72001D Fracture of unspecified part of neck of right femur, subsequent encounter for closed fracture with routine healing: Secondary | ICD-10-CM | POA: Diagnosis not present

## 2020-12-25 DIAGNOSIS — F0150 Vascular dementia without behavioral disturbance: Secondary | ICD-10-CM | POA: Diagnosis not present

## 2020-12-25 DIAGNOSIS — E1122 Type 2 diabetes mellitus with diabetic chronic kidney disease: Secondary | ICD-10-CM | POA: Diagnosis not present

## 2020-12-25 DIAGNOSIS — N1832 Chronic kidney disease, stage 3b: Secondary | ICD-10-CM | POA: Diagnosis not present

## 2020-12-25 DIAGNOSIS — I2510 Atherosclerotic heart disease of native coronary artery without angina pectoris: Secondary | ICD-10-CM | POA: Diagnosis not present

## 2020-12-25 DIAGNOSIS — I129 Hypertensive chronic kidney disease with stage 1 through stage 4 chronic kidney disease, or unspecified chronic kidney disease: Secondary | ICD-10-CM | POA: Diagnosis not present

## 2020-12-25 DIAGNOSIS — F329 Major depressive disorder, single episode, unspecified: Secondary | ICD-10-CM | POA: Diagnosis not present

## 2020-12-25 DIAGNOSIS — S72391D Other fracture of shaft of right femur, subsequent encounter for closed fracture with routine healing: Secondary | ICD-10-CM | POA: Diagnosis not present

## 2020-12-26 DIAGNOSIS — I2510 Atherosclerotic heart disease of native coronary artery without angina pectoris: Secondary | ICD-10-CM | POA: Diagnosis not present

## 2020-12-26 DIAGNOSIS — F0150 Vascular dementia without behavioral disturbance: Secondary | ICD-10-CM | POA: Diagnosis not present

## 2020-12-26 DIAGNOSIS — T84020D Dislocation of internal right hip prosthesis, subsequent encounter: Secondary | ICD-10-CM | POA: Diagnosis not present

## 2020-12-26 DIAGNOSIS — N1832 Chronic kidney disease, stage 3b: Secondary | ICD-10-CM | POA: Diagnosis not present

## 2020-12-26 DIAGNOSIS — F329 Major depressive disorder, single episode, unspecified: Secondary | ICD-10-CM | POA: Diagnosis not present

## 2020-12-26 DIAGNOSIS — E1122 Type 2 diabetes mellitus with diabetic chronic kidney disease: Secondary | ICD-10-CM | POA: Diagnosis not present

## 2020-12-26 DIAGNOSIS — I129 Hypertensive chronic kidney disease with stage 1 through stage 4 chronic kidney disease, or unspecified chronic kidney disease: Secondary | ICD-10-CM | POA: Diagnosis not present

## 2020-12-26 DIAGNOSIS — M810 Age-related osteoporosis without current pathological fracture: Secondary | ICD-10-CM | POA: Diagnosis not present

## 2020-12-26 DIAGNOSIS — S72001D Fracture of unspecified part of neck of right femur, subsequent encounter for closed fracture with routine healing: Secondary | ICD-10-CM | POA: Diagnosis not present

## 2020-12-26 DIAGNOSIS — S72391D Other fracture of shaft of right femur, subsequent encounter for closed fracture with routine healing: Secondary | ICD-10-CM | POA: Diagnosis not present

## 2020-12-27 DIAGNOSIS — I129 Hypertensive chronic kidney disease with stage 1 through stage 4 chronic kidney disease, or unspecified chronic kidney disease: Secondary | ICD-10-CM | POA: Diagnosis not present

## 2020-12-27 DIAGNOSIS — F329 Major depressive disorder, single episode, unspecified: Secondary | ICD-10-CM | POA: Diagnosis not present

## 2020-12-27 DIAGNOSIS — I2510 Atherosclerotic heart disease of native coronary artery without angina pectoris: Secondary | ICD-10-CM | POA: Diagnosis not present

## 2020-12-27 DIAGNOSIS — N1832 Chronic kidney disease, stage 3b: Secondary | ICD-10-CM | POA: Diagnosis not present

## 2020-12-27 DIAGNOSIS — S72001D Fracture of unspecified part of neck of right femur, subsequent encounter for closed fracture with routine healing: Secondary | ICD-10-CM | POA: Diagnosis not present

## 2020-12-27 DIAGNOSIS — S72391D Other fracture of shaft of right femur, subsequent encounter for closed fracture with routine healing: Secondary | ICD-10-CM | POA: Diagnosis not present

## 2020-12-27 DIAGNOSIS — M810 Age-related osteoporosis without current pathological fracture: Secondary | ICD-10-CM | POA: Diagnosis not present

## 2020-12-27 DIAGNOSIS — F0150 Vascular dementia without behavioral disturbance: Secondary | ICD-10-CM | POA: Diagnosis not present

## 2020-12-27 DIAGNOSIS — E1122 Type 2 diabetes mellitus with diabetic chronic kidney disease: Secondary | ICD-10-CM | POA: Diagnosis not present

## 2020-12-28 DIAGNOSIS — F0150 Vascular dementia without behavioral disturbance: Secondary | ICD-10-CM | POA: Diagnosis not present

## 2020-12-28 DIAGNOSIS — I2510 Atherosclerotic heart disease of native coronary artery without angina pectoris: Secondary | ICD-10-CM | POA: Diagnosis not present

## 2020-12-28 DIAGNOSIS — M810 Age-related osteoporosis without current pathological fracture: Secondary | ICD-10-CM | POA: Diagnosis not present

## 2020-12-28 DIAGNOSIS — I129 Hypertensive chronic kidney disease with stage 1 through stage 4 chronic kidney disease, or unspecified chronic kidney disease: Secondary | ICD-10-CM | POA: Diagnosis not present

## 2020-12-28 DIAGNOSIS — N1832 Chronic kidney disease, stage 3b: Secondary | ICD-10-CM | POA: Diagnosis not present

## 2020-12-28 DIAGNOSIS — E1122 Type 2 diabetes mellitus with diabetic chronic kidney disease: Secondary | ICD-10-CM | POA: Diagnosis not present

## 2020-12-28 DIAGNOSIS — F329 Major depressive disorder, single episode, unspecified: Secondary | ICD-10-CM | POA: Diagnosis not present

## 2020-12-28 DIAGNOSIS — S72001D Fracture of unspecified part of neck of right femur, subsequent encounter for closed fracture with routine healing: Secondary | ICD-10-CM | POA: Diagnosis not present

## 2020-12-28 DIAGNOSIS — S72391D Other fracture of shaft of right femur, subsequent encounter for closed fracture with routine healing: Secondary | ICD-10-CM | POA: Diagnosis not present

## 2021-01-01 DIAGNOSIS — I2510 Atherosclerotic heart disease of native coronary artery without angina pectoris: Secondary | ICD-10-CM | POA: Diagnosis not present

## 2021-01-01 DIAGNOSIS — S72001D Fracture of unspecified part of neck of right femur, subsequent encounter for closed fracture with routine healing: Secondary | ICD-10-CM | POA: Diagnosis not present

## 2021-01-01 DIAGNOSIS — I129 Hypertensive chronic kidney disease with stage 1 through stage 4 chronic kidney disease, or unspecified chronic kidney disease: Secondary | ICD-10-CM | POA: Diagnosis not present

## 2021-01-01 DIAGNOSIS — F0150 Vascular dementia without behavioral disturbance: Secondary | ICD-10-CM | POA: Diagnosis not present

## 2021-01-01 DIAGNOSIS — M810 Age-related osteoporosis without current pathological fracture: Secondary | ICD-10-CM | POA: Diagnosis not present

## 2021-01-01 DIAGNOSIS — S72391D Other fracture of shaft of right femur, subsequent encounter for closed fracture with routine healing: Secondary | ICD-10-CM | POA: Diagnosis not present

## 2021-01-01 DIAGNOSIS — E1122 Type 2 diabetes mellitus with diabetic chronic kidney disease: Secondary | ICD-10-CM | POA: Diagnosis not present

## 2021-01-01 DIAGNOSIS — N1832 Chronic kidney disease, stage 3b: Secondary | ICD-10-CM | POA: Diagnosis not present

## 2021-01-01 DIAGNOSIS — F329 Major depressive disorder, single episode, unspecified: Secondary | ICD-10-CM | POA: Diagnosis not present

## 2021-01-02 DIAGNOSIS — T84020S Dislocation of internal right hip prosthesis, sequela: Secondary | ICD-10-CM | POA: Diagnosis not present

## 2021-01-02 DIAGNOSIS — F329 Major depressive disorder, single episode, unspecified: Secondary | ICD-10-CM | POA: Diagnosis not present

## 2021-01-07 DIAGNOSIS — N1832 Chronic kidney disease, stage 3b: Secondary | ICD-10-CM | POA: Diagnosis not present

## 2021-01-07 DIAGNOSIS — M810 Age-related osteoporosis without current pathological fracture: Secondary | ICD-10-CM | POA: Diagnosis not present

## 2021-01-07 DIAGNOSIS — S72391D Other fracture of shaft of right femur, subsequent encounter for closed fracture with routine healing: Secondary | ICD-10-CM | POA: Diagnosis not present

## 2021-01-07 DIAGNOSIS — I2510 Atherosclerotic heart disease of native coronary artery without angina pectoris: Secondary | ICD-10-CM | POA: Diagnosis not present

## 2021-01-07 DIAGNOSIS — F0150 Vascular dementia without behavioral disturbance: Secondary | ICD-10-CM | POA: Diagnosis not present

## 2021-01-07 DIAGNOSIS — F329 Major depressive disorder, single episode, unspecified: Secondary | ICD-10-CM | POA: Diagnosis not present

## 2021-01-07 DIAGNOSIS — E1122 Type 2 diabetes mellitus with diabetic chronic kidney disease: Secondary | ICD-10-CM | POA: Diagnosis not present

## 2021-01-07 DIAGNOSIS — I129 Hypertensive chronic kidney disease with stage 1 through stage 4 chronic kidney disease, or unspecified chronic kidney disease: Secondary | ICD-10-CM | POA: Diagnosis not present

## 2021-01-07 DIAGNOSIS — S72001D Fracture of unspecified part of neck of right femur, subsequent encounter for closed fracture with routine healing: Secondary | ICD-10-CM | POA: Diagnosis not present

## 2021-01-08 DIAGNOSIS — S72001D Fracture of unspecified part of neck of right femur, subsequent encounter for closed fracture with routine healing: Secondary | ICD-10-CM | POA: Diagnosis not present

## 2021-01-08 DIAGNOSIS — F0150 Vascular dementia without behavioral disturbance: Secondary | ICD-10-CM | POA: Diagnosis not present

## 2021-01-08 DIAGNOSIS — I2510 Atherosclerotic heart disease of native coronary artery without angina pectoris: Secondary | ICD-10-CM | POA: Diagnosis not present

## 2021-01-08 DIAGNOSIS — I129 Hypertensive chronic kidney disease with stage 1 through stage 4 chronic kidney disease, or unspecified chronic kidney disease: Secondary | ICD-10-CM | POA: Diagnosis not present

## 2021-01-08 DIAGNOSIS — M810 Age-related osteoporosis without current pathological fracture: Secondary | ICD-10-CM | POA: Diagnosis not present

## 2021-01-08 DIAGNOSIS — S72391D Other fracture of shaft of right femur, subsequent encounter for closed fracture with routine healing: Secondary | ICD-10-CM | POA: Diagnosis not present

## 2021-01-08 DIAGNOSIS — F329 Major depressive disorder, single episode, unspecified: Secondary | ICD-10-CM | POA: Diagnosis not present

## 2021-01-08 DIAGNOSIS — E1122 Type 2 diabetes mellitus with diabetic chronic kidney disease: Secondary | ICD-10-CM | POA: Diagnosis not present

## 2021-01-08 DIAGNOSIS — N1832 Chronic kidney disease, stage 3b: Secondary | ICD-10-CM | POA: Diagnosis not present

## 2021-01-14 ENCOUNTER — Emergency Department
Admission: EM | Admit: 2021-01-14 | Discharge: 2021-01-14 | Disposition: A | Discharge status: Home / Self Care | Payer: Medicare HMO | Attending: Emergency Medicine | Admitting: Emergency Medicine

## 2021-01-14 ENCOUNTER — Other Ambulatory Visit: Payer: Self-pay

## 2021-01-14 ENCOUNTER — Emergency Department: Payer: Medicare HMO

## 2021-01-14 DIAGNOSIS — I2510 Atherosclerotic heart disease of native coronary artery without angina pectoris: Secondary | ICD-10-CM | POA: Diagnosis not present

## 2021-01-14 DIAGNOSIS — Z7902 Long term (current) use of antithrombotics/antiplatelets: Secondary | ICD-10-CM | POA: Insufficient documentation

## 2021-01-14 DIAGNOSIS — W19XXXA Unspecified fall, initial encounter: Secondary | ICD-10-CM | POA: Diagnosis not present

## 2021-01-14 DIAGNOSIS — R5381 Other malaise: Secondary | ICD-10-CM | POA: Diagnosis not present

## 2021-01-14 DIAGNOSIS — I10 Essential (primary) hypertension: Secondary | ICD-10-CM | POA: Diagnosis not present

## 2021-01-14 DIAGNOSIS — F0390 Unspecified dementia without behavioral disturbance: Secondary | ICD-10-CM | POA: Diagnosis not present

## 2021-01-14 DIAGNOSIS — Z79899 Other long term (current) drug therapy: Secondary | ICD-10-CM | POA: Insufficient documentation

## 2021-01-14 DIAGNOSIS — Z87891 Personal history of nicotine dependence: Secondary | ICD-10-CM | POA: Diagnosis not present

## 2021-01-14 DIAGNOSIS — M2550 Pain in unspecified joint: Secondary | ICD-10-CM | POA: Diagnosis not present

## 2021-01-14 DIAGNOSIS — Z96641 Presence of right artificial hip joint: Secondary | ICD-10-CM | POA: Diagnosis not present

## 2021-01-14 DIAGNOSIS — E119 Type 2 diabetes mellitus without complications: Secondary | ICD-10-CM | POA: Diagnosis not present

## 2021-01-14 DIAGNOSIS — M25551 Pain in right hip: Secondary | ICD-10-CM | POA: Insufficient documentation

## 2021-01-14 DIAGNOSIS — Z7982 Long term (current) use of aspirin: Secondary | ICD-10-CM | POA: Insufficient documentation

## 2021-01-14 DIAGNOSIS — Z794 Long term (current) use of insulin: Secondary | ICD-10-CM | POA: Diagnosis not present

## 2021-01-14 DIAGNOSIS — Z7401 Bed confinement status: Secondary | ICD-10-CM | POA: Diagnosis not present

## 2021-01-14 DIAGNOSIS — S73006A Unspecified dislocation of unspecified hip, initial encounter: Secondary | ICD-10-CM

## 2021-01-14 DIAGNOSIS — R404 Transient alteration of awareness: Secondary | ICD-10-CM | POA: Diagnosis not present

## 2021-01-14 NOTE — ED Provider Notes (Signed)
[facility] Emergency Department Provider Note   ____________________________________________   Event Date/Time   First MD Initiated Contact with Patient 01/14/21 1153     (approximate)  I have reviewed the triage vital signs and the nursing notes.   HISTORY  Chief Complaint Hip Pain    HPI Shawna Lynch is a 86 y.o. female with past medical history of dementia, hypertension, diabetes, and CAD who presents to the ED for hip pain.  History is limited due to patient's severe dementia and she does not remember why she is here in the hospital.  Per EMS, patient comes from home [facility], where her nurse was concerned that her hip had "popped out of place."  They were unable to describe to EMS which hip seem dislocated, patient currently denies any complaints, is not sure if there is anything wrong with her hip.  She does have a history of recent hip replacement on the right with dislocation 1 month ago requiring close reduction in the OR.        Past Medical History:  Diagnosis Date  . Anxiety   . Arthritis    oa  . Coronary artery disease    04/06/17 PCI with DES to [facility], 90% in diag from LAD, nonobstructive disease in LAD, RCA. Normal EF.   [name] Dementia ([place])   . Depression   . Diabetes mellitus without complication ([place])   . Hypertension   . NSTEMI (non-ST elevated myocardial infarction) ([place])   . Ringing in ears   . TIA (transient ischemic attack)     Patient Active Problem List   Diagnosis Date Noted  . Closed right hip fracture ([place]) 11/10/2020  . Vascular dementia without behavioral disturbance ([place]) 09/12/2020  . Depression 09/12/2020  . Acute ischemic stroke ([place]) - R brain stroke not seen on imaging 08/19/2020  . TIA (transient ischemic attack) 08/13/2020  . Mild cognitive impairment 03/09/2018  . Pruritus 03/09/2018  . CAD S/P percutaneous coronary angioplasty 04/27/2017  . Dementia ([place]) 04/27/2017  . Dyslipidemia 04/07/2017   . Non-insulin treated type 2 diabetes mellitus ([place]) 04/06/2017  . Essential hypertension 04/06/2017  . History of non-ST elevation myocardial infarction (NSTEMI) 04/03/2017    Past Surgical History:  Procedure Laterality Date  . ABDOMINAL HYSTERECTOMY    . CORONARY STENT INTERVENTION  04/06/2017  . CORONARY STENT INTERVENTION N/A 04/06/2017   Procedure: Coronary Stent Intervention;  Surgeon: Sherren Mocha, MD;  Location: [place] [facility];  Service: Cardiovascular;  Laterality: N/A;  . HIP ARTHROPLASTY Right 11/11/2020   Procedure: ARTHROPLASTY BIPOLAR HIP (HEMIARTHROPLASTY);  Surgeon: Leim Fabry, MD;  Location: [facility] ORS;  Service: Orthopedics;  Laterality: Right;  . HIP CLOSED REDUCTION Right 12/07/2020   Procedure: CLOSED REDUCTION HIP;  Surgeon: Corky Mull, MD;  Location: [facility] ORS;  Service: Orthopedics;  Laterality: Right;  . LEFT HEART CATH AND CORONARY ANGIOGRAPHY N/A 04/06/2017   Procedure: Left Heart Cath and Coronary Angiography;  Surgeon: Sherren Mocha, MD;  Location: [place] [facility];  Service: Cardiovascular;  Laterality: N/A;    Prior to Admission medications   Medication Sig Start Date End Date Taking? Authorizing Provider  aspirin EC 81 MG EC tablet Take 1 tablet (81 mg total) by mouth daily. 04/07/17   [name], NP  carvedilol (COREG) 6.25 MG tablet Take 6.25 mg by mouth 2 (two) times daily. 10/18/20   [provider]  clopidogrel (PLAVIX) 75 MG tablet Take 75 mg by mouth daily. 09/26/20  [provider]  divalproex (DEPAKOTE) 250 MG DR tablet Take 1 tablet (250 mg total) by mouth 2 (two) times daily. 09/12/20   Marcial Pacas, MD  LANTUS SOLOSTAR 100 UNIT/ML Solostar Pen Inject 14 Units into the skin at bedtime. 06/17/20   [provider]  nitroGLYCERIN (NITROSTAT) 0.4 MG SL tablet Place 1 tablet (0.4 mg total) under the tongue every 5 (five) minutes x 3 doses as needed for chest pain. Patient taking differently: Place 0.4 mg under  the tongue every 5 (five) minutes as needed for chest pain (max 3 doses). 04/07/17   [name], NP  sertraline (ZOLOFT) 25 MG tablet Take 12.5-25 mg by mouth daily. 11/28/20   [provider]  traMADol (ULTRAM) 50 MG tablet Take 1 tablet (50 mg total) by mouth every 6 (six) hours. 11/12/20   Reche Dixon, PA-C  TRULICITY 1.5 AV/6.9VX SOPN Inject 1.5 mg into the skin once a week. Monday 10/18/20   [provider]    Allergies Penicillins, Sulfa antibiotics, Vicodin [hydrocodone-acetaminophen], and Codeine  Family History  Problem Relation Age of Onset  . CAD Father   . Cancer Sister   . Hearing loss Mother   . Hypertension Mother     Social History Social History   Tobacco Use  . Smoking status: Former Research scientist (life sciences)  . Smokeless tobacco: Never Used  . Tobacco comment: quit "20 years ago"  Vaping Use  . Vaping Use: Never used  Substance Use Topics  . Alcohol use: No  . Drug use: No    Review of Systems Unable to obtain secondary to dementia  ____________________________________________   PHYSICAL EXAM:  VITAL SIGNS: ED Triage Vitals  Enc Vitals Group     BP      Pulse      Resp      Temp      Temp src      SpO2      Weight      Height      Head Circumference      Peak Flow      Pain Score      Pain Loc      Pain Edu?      Excl. in [place]?     Constitutional: Alert and oriented to person, but not place, time, or situation. Eyes: Conjunctivae are normal. Head: Atraumatic. Nose: No congestion/rhinnorhea. Mouth/Throat: Mucous membranes are moist. Neck: Normal ROM Cardiovascular: Normal rate, regular rhythm. Grossly normal heart sounds.  2+ DP pulses bilaterally. Respiratory: Normal respiratory effort.  No retractions. Lungs CTAB. Gastrointestinal: Soft and nontender. No distention. Genitourinary: deferred Musculoskeletal: No lower extremity tenderness nor edema.  Right lower extremity appears shortened and internally rotated.  No apparent lower  extremity tenderness to palpation. Neurologic:  Normal speech and language. No gross focal neurologic deficits are appreciated. Skin:  Skin is warm, dry and intact. No rash noted. Psychiatric: Mood and affect are normal. Speech and behavior are normal.  ____________________________________________   LABS (all labs ordered are listed, but only abnormal results are displayed)  Labs Reviewed - No data to display ____________________________________________   PROCEDURES  Procedure(s) performed (including Critical Care):  Procedures   ____________________________________________   INITIAL IMPRESSION / ASSESSMENT AND PLAN / ED COURSE       86 year old female with past medical history of dementia, hypertension, diabetes, and CAD who presents to the ED with concern by staff that her right hip is dislocated.  Hip does appear deformed but patient currently denies any  pain, is neurovascularly intact to her bilateral lower extremities.  We will further assess with x-ray.  Patient currently appears at her baseline mental status.  Hip x-ray reviewed by me and shows no obvious fracture dislocation, negative for acute process per radiology.  On reassessment, patient is moving her right hip without any discomfort.  Son is now at bedside and confirms patient is at her baseline mental status.  She is appropriate for discharge back to nursing facility, son agrees with plan.      ____________________________________________   FINAL CLINICAL IMPRESSION(S) / ED DIAGNOSES  Final diagnoses:  Status post right hip replacement  Dementia without behavioral disturbance, unspecified dementia type [facility])     ED Discharge Orders    None       Note:  This document was prepared using Dragon voice recognition software and may include unintentional dictation errors.   Blake Divine, MD 01/14/21 1257

## 2021-01-14 NOTE — ED Notes (Signed)
E signature not obtained. Pt has dementia.  Handoff report called to [place]. Spoke with Anderson Malta, RN.

## 2021-01-14 NOTE — ED Notes (Signed)
Pt to xray

## 2021-01-14 NOTE — ED Notes (Signed)
Son at bedside. Son states EDP has talked with him and reviewed xray findings.

## 2021-01-14 NOTE — ED Triage Notes (Signed)
Pt to ED via [place] from [place]. EMS was called by nurse who noted that pt was c/o hip pain, and that one hip had "popped out of place". EMS unable to describe which hip. Pt has dementia, not answering questions. EDP at bedside.

## 2021-01-15 DIAGNOSIS — M810 Age-related osteoporosis without current pathological fracture: Secondary | ICD-10-CM | POA: Diagnosis not present

## 2021-01-15 DIAGNOSIS — I2510 Atherosclerotic heart disease of native coronary artery without angina pectoris: Secondary | ICD-10-CM | POA: Diagnosis not present

## 2021-01-15 DIAGNOSIS — F329 Major depressive disorder, single episode, unspecified: Secondary | ICD-10-CM | POA: Diagnosis not present

## 2021-01-15 DIAGNOSIS — E1122 Type 2 diabetes mellitus with diabetic chronic kidney disease: Secondary | ICD-10-CM | POA: Diagnosis not present

## 2021-01-15 DIAGNOSIS — N1832 Chronic kidney disease, stage 3b: Secondary | ICD-10-CM | POA: Diagnosis not present

## 2021-01-15 DIAGNOSIS — F0150 Vascular dementia without behavioral disturbance: Secondary | ICD-10-CM | POA: Diagnosis not present

## 2021-01-15 DIAGNOSIS — I129 Hypertensive chronic kidney disease with stage 1 through stage 4 chronic kidney disease, or unspecified chronic kidney disease: Secondary | ICD-10-CM | POA: Diagnosis not present

## 2021-01-15 DIAGNOSIS — S72391D Other fracture of shaft of right femur, subsequent encounter for closed fracture with routine healing: Secondary | ICD-10-CM | POA: Diagnosis not present

## 2021-01-15 DIAGNOSIS — S72001D Fracture of unspecified part of neck of right femur, subsequent encounter for closed fracture with routine healing: Secondary | ICD-10-CM | POA: Diagnosis not present

## 2021-01-16 DIAGNOSIS — Z66 Do not resuscitate: Secondary | ICD-10-CM | POA: Diagnosis not present

## 2021-01-16 DIAGNOSIS — E43 Unspecified severe protein-calorie malnutrition: Secondary | ICD-10-CM | POA: Diagnosis not present

## 2021-01-16 DIAGNOSIS — M810 Age-related osteoporosis without current pathological fracture: Secondary | ICD-10-CM | POA: Diagnosis not present

## 2021-01-16 DIAGNOSIS — N1832 Chronic kidney disease, stage 3b: Secondary | ICD-10-CM | POA: Diagnosis not present

## 2021-01-16 DIAGNOSIS — F0391 Unspecified dementia with behavioral disturbance: Secondary | ICD-10-CM | POA: Diagnosis not present

## 2021-01-16 DIAGNOSIS — I129 Hypertensive chronic kidney disease with stage 1 through stage 4 chronic kidney disease, or unspecified chronic kidney disease: Secondary | ICD-10-CM | POA: Diagnosis not present

## 2021-01-16 DIAGNOSIS — S72391D Other fracture of shaft of right femur, subsequent encounter for closed fracture with routine healing: Secondary | ICD-10-CM | POA: Diagnosis not present

## 2021-01-16 DIAGNOSIS — S72001D Fracture of unspecified part of neck of right femur, subsequent encounter for closed fracture with routine healing: Secondary | ICD-10-CM | POA: Diagnosis not present

## 2021-01-16 DIAGNOSIS — F0150 Vascular dementia without behavioral disturbance: Secondary | ICD-10-CM | POA: Diagnosis not present

## 2021-01-16 DIAGNOSIS — I2510 Atherosclerotic heart disease of native coronary artery without angina pectoris: Secondary | ICD-10-CM | POA: Diagnosis not present

## 2021-01-16 DIAGNOSIS — R627 Adult failure to thrive: Secondary | ICD-10-CM | POA: Diagnosis not present

## 2021-01-16 DIAGNOSIS — E1122 Type 2 diabetes mellitus with diabetic chronic kidney disease: Secondary | ICD-10-CM | POA: Diagnosis not present

## 2021-01-16 DIAGNOSIS — F329 Major depressive disorder, single episode, unspecified: Secondary | ICD-10-CM | POA: Diagnosis not present

## 2021-01-22 DIAGNOSIS — S72391D Other fracture of shaft of right femur, subsequent encounter for closed fracture with routine healing: Secondary | ICD-10-CM | POA: Diagnosis not present

## 2021-01-22 DIAGNOSIS — F329 Major depressive disorder, single episode, unspecified: Secondary | ICD-10-CM | POA: Diagnosis not present

## 2021-01-22 DIAGNOSIS — M810 Age-related osteoporosis without current pathological fracture: Secondary | ICD-10-CM | POA: Diagnosis not present

## 2021-01-22 DIAGNOSIS — S72001D Fracture of unspecified part of neck of right femur, subsequent encounter for closed fracture with routine healing: Secondary | ICD-10-CM | POA: Diagnosis not present

## 2021-01-22 DIAGNOSIS — F0150 Vascular dementia without behavioral disturbance: Secondary | ICD-10-CM | POA: Diagnosis not present

## 2021-01-22 DIAGNOSIS — I129 Hypertensive chronic kidney disease with stage 1 through stage 4 chronic kidney disease, or unspecified chronic kidney disease: Secondary | ICD-10-CM | POA: Diagnosis not present

## 2021-01-22 DIAGNOSIS — I2510 Atherosclerotic heart disease of native coronary artery without angina pectoris: Secondary | ICD-10-CM | POA: Diagnosis not present

## 2021-01-22 DIAGNOSIS — E1122 Type 2 diabetes mellitus with diabetic chronic kidney disease: Secondary | ICD-10-CM | POA: Diagnosis not present

## 2021-01-22 DIAGNOSIS — N1832 Chronic kidney disease, stage 3b: Secondary | ICD-10-CM | POA: Diagnosis not present

## 2021-01-23 DIAGNOSIS — N1832 Chronic kidney disease, stage 3b: Secondary | ICD-10-CM | POA: Diagnosis not present

## 2021-01-23 DIAGNOSIS — E1122 Type 2 diabetes mellitus with diabetic chronic kidney disease: Secondary | ICD-10-CM | POA: Diagnosis not present

## 2021-01-23 DIAGNOSIS — I129 Hypertensive chronic kidney disease with stage 1 through stage 4 chronic kidney disease, or unspecified chronic kidney disease: Secondary | ICD-10-CM | POA: Diagnosis not present

## 2021-01-23 DIAGNOSIS — S72001D Fracture of unspecified part of neck of right femur, subsequent encounter for closed fracture with routine healing: Secondary | ICD-10-CM | POA: Diagnosis not present

## 2021-01-23 DIAGNOSIS — I2510 Atherosclerotic heart disease of native coronary artery without angina pectoris: Secondary | ICD-10-CM | POA: Diagnosis not present

## 2021-01-23 DIAGNOSIS — F329 Major depressive disorder, single episode, unspecified: Secondary | ICD-10-CM | POA: Diagnosis not present

## 2021-01-23 DIAGNOSIS — F0150 Vascular dementia without behavioral disturbance: Secondary | ICD-10-CM | POA: Diagnosis not present

## 2021-01-23 DIAGNOSIS — S72391D Other fracture of shaft of right femur, subsequent encounter for closed fracture with routine healing: Secondary | ICD-10-CM | POA: Diagnosis not present

## 2021-01-23 DIAGNOSIS — M810 Age-related osteoporosis without current pathological fracture: Secondary | ICD-10-CM | POA: Diagnosis not present

## 2021-02-14 DIAGNOSIS — L603 Nail dystrophy: Secondary | ICD-10-CM | POA: Diagnosis not present

## 2021-02-14 DIAGNOSIS — R269 Unspecified abnormalities of gait and mobility: Secondary | ICD-10-CM | POA: Diagnosis not present

## 2021-02-14 DIAGNOSIS — F0390 Unspecified dementia without behavioral disturbance: Secondary | ICD-10-CM | POA: Diagnosis not present

## 2021-02-14 DIAGNOSIS — M79673 Pain in unspecified foot: Secondary | ICD-10-CM | POA: Diagnosis not present

## 2021-02-14 DIAGNOSIS — B351 Tinea unguium: Secondary | ICD-10-CM | POA: Diagnosis not present

## 2021-03-13 ENCOUNTER — Ambulatory Visit: Payer: Medicare HMO | Admitting: Neurology

## 2021-06-04 DIAGNOSIS — Z79899 Other long term (current) drug therapy: Secondary | ICD-10-CM | POA: Diagnosis not present

## 2021-06-04 DIAGNOSIS — N390 Urinary tract infection, site not specified: Secondary | ICD-10-CM | POA: Diagnosis not present

## 2021-06-20 DIAGNOSIS — E559 Vitamin D deficiency, unspecified: Secondary | ICD-10-CM | POA: Diagnosis not present

## 2021-06-20 DIAGNOSIS — E1122 Type 2 diabetes mellitus with diabetic chronic kidney disease: Secondary | ICD-10-CM | POA: Diagnosis not present

## 2021-06-20 DIAGNOSIS — N1832 Chronic kidney disease, stage 3b: Secondary | ICD-10-CM | POA: Diagnosis not present

## 2021-06-20 DIAGNOSIS — I25118 Atherosclerotic heart disease of native coronary artery with other forms of angina pectoris: Secondary | ICD-10-CM | POA: Diagnosis not present

## 2021-06-20 DIAGNOSIS — F329 Major depressive disorder, single episode, unspecified: Secondary | ICD-10-CM | POA: Diagnosis not present

## 2021-06-20 DIAGNOSIS — I10 Essential (primary) hypertension: Secondary | ICD-10-CM | POA: Diagnosis not present

## 2021-06-20 DIAGNOSIS — M810 Age-related osteoporosis without current pathological fracture: Secondary | ICD-10-CM | POA: Diagnosis not present

## 2021-06-20 DIAGNOSIS — D519 Vitamin B12 deficiency anemia, unspecified: Secondary | ICD-10-CM | POA: Diagnosis not present

## 2021-08-31 NOTE — Progress Notes (Signed)
Chart reviewed, agree above plan ?

## 2021-10-30 DEATH — deceased
